# Patient Record
Sex: Male | Born: 1950 | Race: White | Hispanic: No | Marital: Married | Smoking: Former smoker
Health system: Southern US, Community
[De-identification: ages and names within clinical notes are randomized; demographics above are authoritative.]

## PROBLEM LIST (undated history)

## (undated) DIAGNOSIS — I341 Nonrheumatic mitral (valve) prolapse: Secondary | ICD-10-CM

## (undated) DIAGNOSIS — I4891 Unspecified atrial fibrillation: Secondary | ICD-10-CM

## (undated) DIAGNOSIS — I4510 Unspecified right bundle-branch block: Secondary | ICD-10-CM

## (undated) DIAGNOSIS — C4491 Basal cell carcinoma of skin, unspecified: Secondary | ICD-10-CM

## (undated) DIAGNOSIS — E785 Hyperlipidemia, unspecified: Secondary | ICD-10-CM

## (undated) HISTORY — DX: Unspecified right bundle-branch block: I45.10

## (undated) HISTORY — PX: BASAL CELL CARCINOMA EXCISION: SHX1214

## (undated) HISTORY — DX: Unspecified atrial fibrillation: I48.91

## (undated) HISTORY — DX: Hyperlipidemia, unspecified: E78.5

## (undated) HISTORY — PX: ANAL FISSURE REPAIR: SHX2312

## (undated) HISTORY — DX: Nonrheumatic mitral (valve) prolapse: I34.1

## (undated) HISTORY — PX: COLONOSCOPY: SHX174

## (undated) HISTORY — DX: Basal cell carcinoma of skin, unspecified: C44.91

---

## 1997-10-05 ENCOUNTER — Ambulatory Visit (HOSPITAL_COMMUNITY): Admission: RE | Admit: 1997-10-05 | Discharge: 1997-10-05 | Payer: Self-pay | Admitting: Gastroenterology

## 2000-01-26 ENCOUNTER — Emergency Department (HOSPITAL_COMMUNITY): Admission: EM | Admit: 2000-01-26 | Discharge: 2000-01-26 | Payer: Self-pay | Admitting: Emergency Medicine

## 2000-01-30 ENCOUNTER — Encounter (HOSPITAL_BASED_OUTPATIENT_CLINIC_OR_DEPARTMENT_OTHER): Payer: Self-pay | Admitting: General Surgery

## 2000-01-30 ENCOUNTER — Encounter: Admission: RE | Admit: 2000-01-30 | Discharge: 2000-01-30 | Payer: Self-pay | Admitting: General Surgery

## 2000-01-31 ENCOUNTER — Encounter (INDEPENDENT_AMBULATORY_CARE_PROVIDER_SITE_OTHER): Payer: Self-pay | Admitting: *Deleted

## 2000-01-31 ENCOUNTER — Ambulatory Visit (HOSPITAL_BASED_OUTPATIENT_CLINIC_OR_DEPARTMENT_OTHER): Admission: RE | Admit: 2000-01-31 | Discharge: 2000-01-31 | Payer: Self-pay | Admitting: General Surgery

## 2003-03-05 HISTORY — PX: OTHER SURGICAL HISTORY: SHX169

## 2004-06-07 ENCOUNTER — Ambulatory Visit: Payer: Self-pay | Admitting: Internal Medicine

## 2004-07-31 ENCOUNTER — Ambulatory Visit: Payer: Self-pay | Admitting: Internal Medicine

## 2005-04-25 ENCOUNTER — Ambulatory Visit: Payer: Self-pay | Admitting: Internal Medicine

## 2006-05-14 ENCOUNTER — Ambulatory Visit: Payer: Self-pay | Admitting: Internal Medicine

## 2006-05-14 LAB — CONVERTED CEMR LAB
ALT: 23 units/L (ref 0–40)
AST: 27 units/L (ref 0–37)
Albumin: 4.1 g/dL (ref 3.5–5.2)
Alkaline Phosphatase: 54 units/L (ref 39–117)
BUN: 8 mg/dL (ref 6–23)
Basophils Absolute: 0 10*3/uL (ref 0.0–0.1)
Basophils Relative: 0.1 % (ref 0.0–1.0)
Bilirubin, Direct: 0.2 mg/dL (ref 0.0–0.3)
CO2: 33 meq/L — ABNORMAL HIGH (ref 19–32)
Calcium: 9.2 mg/dL (ref 8.4–10.5)
Chloride: 104 meq/L (ref 96–112)
Cholesterol: 174 mg/dL (ref 0–200)
Creatinine, Ser: 0.9 mg/dL (ref 0.4–1.5)
Eosinophils Absolute: 0.1 10*3/uL (ref 0.0–0.6)
Eosinophils Relative: 1.3 % (ref 0.0–5.0)
GFR calc Af Amer: 113 mL/min
GFR calc non Af Amer: 93 mL/min
Glucose, Bld: 95 mg/dL (ref 70–99)
HCT: 43.9 % (ref 39.0–52.0)
HDL: 60.5 mg/dL (ref >39.0)
Hemoglobin: 14.7 g/dL (ref 13.0–17.0)
LDL Cholesterol: 102 mg/dL — ABNORMAL HIGH (ref 0–99)
Lymphocytes Relative: 21.3 % (ref 12.0–46.0)
MCHC: 33.5 g/dL (ref 30.0–36.0)
MCV: 95.2 fL (ref 78.0–100.0)
Monocytes Absolute: 0.4 10*3/uL (ref 0.2–0.7)
Monocytes Relative: 8.4 % (ref 3.0–11.0)
Neutro Abs: 3.5 10*3/uL (ref 1.4–7.7)
Neutrophils Relative %: 68.9 % (ref 43.0–77.0)
PSA: 1.49 ng/mL (ref 0.10–4.00)
Platelets: 246 10*3/uL (ref 150–400)
Potassium: 4.4 meq/L (ref 3.5–5.1)
RBC: 4.61 M/uL (ref 4.22–5.81)
RDW: 12.1 % (ref 11.5–14.6)
Sodium: 143 meq/L (ref 135–145)
TSH: 0.81 microintl units/mL (ref 0.35–5.50)
Total Bilirubin: 1 mg/dL (ref 0.3–1.2)
Total CHOL/HDL Ratio: 2.9
Total Protein: 6.7 g/dL (ref 6.0–8.3)
Triglycerides: 58 mg/dL (ref 0–149)
VLDL: 12 mg/dL (ref 0–40)
WBC: 5.1 10*3/uL (ref 4.5–10.5)

## 2007-05-14 ENCOUNTER — Encounter: Payer: Self-pay | Admitting: Internal Medicine

## 2007-05-28 ENCOUNTER — Ambulatory Visit: Payer: Self-pay | Admitting: Internal Medicine

## 2007-05-28 DIAGNOSIS — Z8679 Personal history of other diseases of the circulatory system: Secondary | ICD-10-CM | POA: Insufficient documentation

## 2007-05-29 LAB — CONVERTED CEMR LAB
ALT: 17 units/L (ref 0–53)
AST: 25 units/L (ref 0–37)
Albumin: 4.3 g/dL (ref 3.5–5.2)
Alkaline Phosphatase: 61 units/L (ref 39–117)
BUN: 8 mg/dL (ref 6–23)
Basophils Absolute: 0 10*3/uL (ref 0.0–0.1)
Basophils Relative: 0.6 % (ref 0.0–1.0)
Bilirubin, Direct: 0.1 mg/dL (ref 0.0–0.3)
CO2: 33 meq/L — ABNORMAL HIGH (ref 19–32)
Calcium: 9.3 mg/dL (ref 8.4–10.5)
Chloride: 102 meq/L (ref 96–112)
Cholesterol: 178 mg/dL (ref 0–200)
Creatinine, Ser: 1 mg/dL (ref 0.4–1.5)
Eosinophils Absolute: 0.1 10*3/uL (ref 0.0–0.6)
Eosinophils Relative: 1.4 % (ref 0.0–5.0)
GFR calc Af Amer: 100 mL/min
GFR calc non Af Amer: 82 mL/min
Glucose, Bld: 102 mg/dL — ABNORMAL HIGH (ref 70–99)
HCT: 42.9 % (ref 39.0–52.0)
HDL: 60.5 mg/dL (ref >39.0)
Hemoglobin: 14.3 g/dL (ref 13.0–17.0)
LDL Cholesterol: 105 mg/dL — ABNORMAL HIGH (ref 0–99)
Lymphocytes Relative: 22.1 % (ref 12.0–46.0)
MCHC: 33.4 g/dL (ref 30.0–36.0)
MCV: 94.6 fL (ref 78.0–100.0)
Monocytes Absolute: 0.5 10*3/uL (ref 0.2–0.7)
Monocytes Relative: 8.5 % (ref 3.0–11.0)
Neutro Abs: 3.5 10*3/uL (ref 1.4–7.7)
Neutrophils Relative %: 67.4 % (ref 43.0–77.0)
PSA: 0.72 ng/mL (ref 0.10–4.00)
Platelets: 243 10*3/uL (ref 150–400)
Potassium: 4.8 meq/L (ref 3.5–5.1)
RBC: 4.53 M/uL (ref 4.22–5.81)
RDW: 11.7 % (ref 11.5–14.6)
Sodium: 139 meq/L (ref 135–145)
Total Bilirubin: 1.2 mg/dL (ref 0.3–1.2)
Total CHOL/HDL Ratio: 2.9
Total Protein: 6.9 g/dL (ref 6.0–8.3)
Triglycerides: 61 mg/dL (ref 0–149)
VLDL: 12 mg/dL (ref 0–40)
WBC: 5.3 10*3/uL (ref 4.5–10.5)

## 2007-06-11 ENCOUNTER — Ambulatory Visit: Payer: Self-pay | Admitting: Internal Medicine

## 2007-06-11 ENCOUNTER — Encounter (INDEPENDENT_AMBULATORY_CARE_PROVIDER_SITE_OTHER): Payer: Self-pay | Admitting: *Deleted

## 2007-06-11 LAB — CONVERTED CEMR LAB
OCCULT 1: NEGATIVE
OCCULT 2: NEGATIVE
OCCULT 3: NEGATIVE

## 2007-09-18 ENCOUNTER — Ambulatory Visit: Payer: Self-pay | Admitting: Internal Medicine

## 2007-09-18 DIAGNOSIS — R002 Palpitations: Secondary | ICD-10-CM | POA: Insufficient documentation

## 2007-09-22 ENCOUNTER — Encounter (INDEPENDENT_AMBULATORY_CARE_PROVIDER_SITE_OTHER): Payer: Self-pay | Admitting: *Deleted

## 2008-09-21 ENCOUNTER — Ambulatory Visit: Payer: Self-pay | Admitting: Internal Medicine

## 2008-09-21 DIAGNOSIS — E785 Hyperlipidemia, unspecified: Secondary | ICD-10-CM

## 2008-09-21 DIAGNOSIS — E782 Mixed hyperlipidemia: Secondary | ICD-10-CM | POA: Insufficient documentation

## 2008-09-28 ENCOUNTER — Encounter (INDEPENDENT_AMBULATORY_CARE_PROVIDER_SITE_OTHER): Payer: Self-pay | Admitting: *Deleted

## 2008-11-01 ENCOUNTER — Ambulatory Visit: Payer: Self-pay | Admitting: Gastroenterology

## 2008-11-22 ENCOUNTER — Ambulatory Visit: Payer: Self-pay | Admitting: Gastroenterology

## 2008-11-22 ENCOUNTER — Encounter: Payer: Self-pay | Admitting: Gastroenterology

## 2008-11-22 LAB — HM COLONOSCOPY: HM Colonoscopy: NORMAL

## 2008-11-24 ENCOUNTER — Encounter: Payer: Self-pay | Admitting: Gastroenterology

## 2008-12-10 ENCOUNTER — Emergency Department (HOSPITAL_COMMUNITY): Admission: EM | Admit: 2008-12-10 | Discharge: 2008-12-10 | Payer: Self-pay | Admitting: Emergency Medicine

## 2009-07-20 ENCOUNTER — Telehealth (INDEPENDENT_AMBULATORY_CARE_PROVIDER_SITE_OTHER): Payer: Self-pay | Admitting: *Deleted

## 2009-08-07 ENCOUNTER — Ambulatory Visit: Payer: Self-pay | Admitting: Internal Medicine

## 2009-08-07 DIAGNOSIS — Z85828 Personal history of other malignant neoplasm of skin: Secondary | ICD-10-CM | POA: Insufficient documentation

## 2010-01-29 ENCOUNTER — Telehealth (INDEPENDENT_AMBULATORY_CARE_PROVIDER_SITE_OTHER): Payer: Self-pay | Admitting: *Deleted

## 2010-04-01 LAB — CONVERTED CEMR LAB
ALT: 16 units/L (ref 0–53)
BUN: 8 mg/dL (ref 6–23)
Basophils Relative: 0 % (ref 0.0–3.0)
CO2: 32 meq/L (ref 19–32)
Calcium: 9.6 mg/dL (ref 8.4–10.5)
Chloride: 108 meq/L (ref 96–112)
Cholesterol, target level: 200 mg/dL
Cholesterol: 186 mg/dL (ref 0–200)
Eosinophils Absolute: 0.1 10*3/uL (ref 0.0–0.7)
Eosinophils Relative: 1.3 % (ref 0.0–5.0)
HCT: 44.1 % (ref 39.0–52.0)
HDL goal, serum: 40 mg/dL
LDL Cholesterol: 105 mg/dL — ABNORMAL HIGH (ref 0–99)
LDL Goal: 160 mg/dL
Lymphs Abs: 1.1 10*3/uL (ref 0.7–4.0)
MCHC: 33.6 g/dL (ref 30.0–36.0)
MCV: 95.6 fL (ref 78.0–100.0)
Magnesium: 2.2 mg/dL (ref 1.5–2.5)
Monocytes Absolute: 0.5 10*3/uL (ref 0.1–1.0)
PSA: 0.52 ng/mL (ref 0.10–4.00)
Platelets: 229 10*3/uL (ref 150.0–400.0)
Potassium: 4.3 meq/L (ref 3.5–5.1)
Potassium: 4.8 meq/L (ref 3.5–5.3)
RBC: 4.61 M/uL (ref 4.22–5.81)
T4, Total: 8 ug/dL (ref 5.0–12.5)
TSH: 0.918 microintl units/mL (ref 0.350–4.50)
TSH: 0.93 microintl units/mL (ref 0.35–5.50)
Total Protein: 7 g/dL (ref 6.0–8.3)
Triglycerides: 75 mg/dL (ref 0.0–149.0)
WBC: 5.1 10*3/uL (ref 4.5–10.5)

## 2010-04-03 NOTE — Progress Notes (Signed)
Summary: Refill Request  Phone Note Call from Patient Call back at Work Phone [phone]   Caller: Patient Summary of Call: Message left on VM: Patient would like a refill on Propecia./Donald Wilkins  Jul 20, 2009 10:31 AM   Follow-up for Phone Call        I reviewed patient's med list and Propecia/Finasteride was removed (no indication that patient had a reaction). I will discuss with Dr.Hopper about re-adding med to list.   Follow-up by: Shonna Chock,  Jul 20, 2009 10:32 AM  Additional Follow-up for Phone Call Additional follow up Details #1::        Per Dr.Hopper ok to re-add Additional Follow-up by: Shonna Chock,  Jul 20, 2009 2:46 PM    New/Updated Medications: FINASTERIDE 5 MG TABS (FINASTERIDE) 1 by mouth once daily Prescriptions: FINASTERIDE 5 MG TABS (FINASTERIDE) 1 by mouth once daily  #30 x 2   Entered by:   Shonna Chock   Authorized by:   Marga Melnick MD   Signed by:   Shonna Chock on 07/20/2009   Method used:   Electronically to        [facility] [id number] (retail)       [address]       [place], [place]  [address]       Ph: [phone]       Fax: [phone]   RxID:   [id number]

## 2010-04-03 NOTE — Progress Notes (Signed)
Summary: Refill Request  Phone Note Refill Request Call back at [phone] Message from:  Pharmacy on January 29, 2010 1:07 PM  Refills Requested: Medication #1:  FLUOXETINE HCL 10 MG  CAPS 1 by mouth once daily **APPOINTMENT DUE**   Dosage confirmed as above?Dosage Confirmed   Supply Requested: 3 months   Last Refilled: 10/13/2009 [facility] [address]  Next Appointment Scheduled: none Initial call taken by: Harold Barban,  January 29, 2010 1:08 PM    Prescriptions: FLUOXETINE HCL 10 MG  CAPS (FLUOXETINE HCL) 1 by mouth once daily **APPOINTMENT DUE**  #90 x 1   Entered by:   Shonna Chock CMA   Authorized by:   Marga Melnick MD   Signed by:   Shonna Chock CMA on 01/29/2010   Method used:   Electronically to        [facility] [phone]* (retail)       [address]       [place], [place]  [address]       Ph: [phone]       Fax: [phone]   RxID:   [id number]

## 2010-04-03 NOTE — Assessment & Plan Note (Signed)
Summary: PAIN IN NECK/CBS   Vital Signs:  Patient profile:   60 year old male Weight:      168 pounds BMI:     24.72 Temp:     98.5 degrees F oral Pulse rate:   60 / minute Resp:     16 per minute BP sitting:   108 / 70  (left arm) Cuff size:   large  Vitals Entered By: Shonna Chock (August 07, 2009 4:14 PM) CC: Left side neck pain x coupe weeks, no known injury Comments REVIEWED MED LIST, PATIENT AGREED DOSE AND INSTRUCTION CORRECT    CC:  Left side neck pain x coupe weeks and no known injury.  History of Present Illness: Mild sore throat  on L for 2 weeks , worse in am but improved during day. Rx: none. 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 ENT:  Denies ear discharge, earache, nasal congestion, postnasal drainage, and sinus pressure; No frontal headache, facial pain, purulence, dental pain. Resp:  Denies cough and sputum productive. Allergy:  Denies itching eyes and sneezing.  Physical Exam  General:  in no acute distress; alert,appropriate and cooperative throughout examination Ears:  External ear exam shows no significant lesions or deformities.  Otoscopic examination reveals clear canals, tympanic membranes are intact bilaterally without bulging, retraction, inflammation or discharge. Hearing is grossly normal bilaterally. Nose:  External nasal examination shows no deformity or inflammation. Nasal mucosa are pink and moist without lesions or  exudates. Mouth:  Oral mucosa and oropharynx without lesions or exudates.  Teeth in good repair; lower  partial. Aphthous ulcer. Neck:  No deformities, masses, or tenderness noted. Thyroid normal. Full ROM Skin:  Intact without suspicious lesions or rashes Cervical Nodes:  No lymphadenopathy noted Axillary Nodes:  No palpable lymphadenopathy   Impression & Recommendations:  Problem # 1:  PHARYNGITIS-ACUTE (ICD-462)  His updated medication list for this problem includes:    Bayer Childrens Aspirin 81 Mg Chew (Aspirin)    Doxycycline Hyclate 100 Mg Caps (Doxycycline hyclate) .[name]... Dissolve tablet in 1 tsp water ; swish well & swallow two times a day . avoid direct sun on this  Problem # 2:  APHTHOUS ULCERS (ICD-528.2)  Complete Medication List: 1)  Bayer Childrens Aspirin 81 Mg Chew (Aspirin) 2)  Omega 3  3)  Coq10  4)  Fluoxetine Hcl 10 Mg Caps (Fluoxetine hcl) .[name].. 1 qd 5)  Finasteride 5 Mg Tabs (Finasteride) .[name].. 1 by mouth once daily 6)  Doxycycline Hyclate 100 Mg Caps (Doxycycline hyclate) .... Dissolve tablet in 1 tsp water ; swish well & swallow two times a day . avoid direct sun on this  Patient Instructions: 1)  ENT referral if symptoms persist Prescriptions: DOXYCYCLINE HYCLATE 100 MG CAPS (DOXYCYCLINE HYCLATE) dissolve tablet in 1 tsp water ; swish well & swallow two times a day . Avoid direct sun on this  #20 x 0   Entered and Authorized by:   Marga Melnick MD   Signed  by:   Marga Melnick MD on 08/07/2009   Method used:   Faxed to ...       [facility] [address] [phone]* (retail)       [address]       [place], [place]  [address]       Ph: [phone]       Fax: [phone]   RxID:   [id number]

## 2010-04-17 ENCOUNTER — Encounter: Payer: Self-pay | Admitting: Internal Medicine

## 2010-04-17 ENCOUNTER — Other Ambulatory Visit: Payer: Self-pay | Admitting: Internal Medicine

## 2010-04-17 ENCOUNTER — Encounter (INDEPENDENT_AMBULATORY_CARE_PROVIDER_SITE_OTHER): Payer: 59 | Admitting: Internal Medicine

## 2010-04-17 DIAGNOSIS — Z125 Encounter for screening for malignant neoplasm of prostate: Secondary | ICD-10-CM

## 2010-04-17 DIAGNOSIS — Z8679 Personal history of other diseases of the circulatory system: Secondary | ICD-10-CM

## 2010-04-17 DIAGNOSIS — Z0000 Encounter for general adult medical examination without abnormal findings: Secondary | ICD-10-CM

## 2010-04-17 DIAGNOSIS — E785 Hyperlipidemia, unspecified: Secondary | ICD-10-CM

## 2010-04-17 LAB — CBC WITH DIFFERENTIAL/PLATELET
Basophils Relative: 0.3 % (ref 0.0–3.0)
Eosinophils Absolute: 0.1 10*3/uL (ref 0.0–0.7)
HCT: 41.5 % (ref 39.0–52.0)
Hemoglobin: 14.4 g/dL (ref 13.0–17.0)
Lymphocytes Relative: 26.4 % (ref 12.0–46.0)
MCHC: 34.8 g/dL (ref 30.0–36.0)
MCV: 94.7 fl (ref 78.0–100.0)
Monocytes Absolute: 0.4 10*3/uL (ref 0.1–1.0)
Neutro Abs: 2.9 10*3/uL (ref 1.4–7.7)
RBC: 4.38 Mil/uL (ref 4.22–5.81)

## 2010-04-17 LAB — HEPATIC FUNCTION PANEL
Albumin: 4.2 g/dL (ref 3.5–5.2)
Alkaline Phosphatase: 50 U/L (ref 39–117)
Total Protein: 6.6 g/dL (ref 6.0–8.3)

## 2010-04-17 LAB — BASIC METABOLIC PANEL
CO2: 30 mEq/L (ref 19–32)
Chloride: 104 mEq/L (ref 96–112)
Potassium: 4.1 mEq/L (ref 3.5–5.1)
Sodium: 140 mEq/L (ref 135–145)

## 2010-04-17 LAB — LIPID PANEL: Total CHOL/HDL Ratio: 3

## 2010-04-17 LAB — TSH: TSH: 0.88 u[IU]/mL (ref 0.35–5.50)

## 2010-04-25 NOTE — Assessment & Plan Note (Signed)
Summary: cpx/fasting//kn   Vital Signs:  Patient profile:   60 year old male Height:      70 inches Weight:      167.4 pounds BMI:     24.11 Temp:     98.7 degrees F oral Pulse rate:   56 / minute Resp:     14 per minute BP sitting:   112 / 70  (left arm) Cuff size:   large  Vitals Entered By: Shonna Chock CMA (April 17, 2010 8:41 AM)    History of Present Illness:    [name] is here for a physical; he is asymptomatic.  Lipid Management History:      Positive NCEP/ATP III risk factors include male age 49 years old or older and family history for ischemic heart disease (females less than 59 years old & males less than 34 years old).  Negative NCEP/ATP III risk factors include non-diabetic, HDL cholesterol greater than 60, non-tobacco-user status, non-hypertensive, no ASHD (atherosclerotic heart disease), no prior stroke/TIA, no peripheral vascular disease, and no history of aortic aneurysm.     Current Medications (verified): 1)  Bayer Childrens Aspirin 81 Mg  Chew (Aspirin) 2)  Omega 3 3)  Coq10 4)  Fluoxetine Hcl 10 Mg  Caps (Fluoxetine Hcl) .[name].. 1 By Mouth Once Daily **appointment Due** 5)  Finasteride 5 Mg Tabs (Finasteride) .[name].. 1 By Mouth Once Daily  Allergies: No Known Drug Allergies  Past History:  Past Medical History: MVP IRBBB Hyperlipidemia: Framingham Study LDL = < 160. FH of premature CAD. NMR Lipoprofile 2007: LDL 122 ( 954/ 233), HDL 68, TG 50. LDL goal = < 160, ideally < 135. 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 Anal fistula  repair 2000 Colonoscopy 1999 for  rectal pain: internal hemorrhoids; Mohs L face 2011 for Basal Cell Cancer  Family History: No FH HTN , CVA,colon cancer, prostate cancer Father: DM,lung  cancer Mother: MI @ 63; M uncle: MI pre 30 Siblings: bro: benign cns tumor  Social History: Married 3 kids Occupation:Director of  Finance Former Smoker: quit  @ age 89 Alcohol use-yes: socially Regular exercise-yes: 4-5X/week treadmill, machines  Review of Systems  The patient denies anorexia, fever, weight loss, weight gain, vision loss, decreased hearing, hoarseness, chest pain, syncope, dyspnea on exertion, peripheral edema, prolonged cough, headaches, hemoptysis, abdominal pain, melena, hematochezia, severe indigestion/heartburn, hematuria, suspicious skin lesions, depression, unusual weight change, abnormal bleeding, enlarged lymph nodes, and angioedema.         Low grade tinnitus , ? from playing drums.  Physical Exam  General:  Thin but well-nourished;alert,appropriate and cooperative throughout examination Head:  Normocephalic and atraumatic without obvious abnormalities.  Eyes:  No corneal or conjunctival inflammation noted.  Perrla. Funduscopic exam benign, without hemorrhages, exudates or papilledema.  Ears:  External ear exam shows no significant lesions or deformities.  Otoscopic examination reveals clear canals, tympanic membranes are intact bilaterally without bulging, retraction, inflammation or discharge. Hearing is grossly normal bilaterally. Nose:  External nasal examination shows no deformity or inflammation. Nasal mucosa are pink and moist without lesions or exudates. Mouth:  Oral mucosa and oropharynx without lesions or exudates.  Teeth in good repair. Lower partial ; upper bridge Neck:  No deformities, masses, or tenderness noted. Lungs:  Normal respiratory effort, chest expands symmetrically. Lungs are clear to auscultation, no crackles or wheezes. Heart:  regular rhythm, no murmur,  no gallop, no rub, no JVD, no HJR, and bradycardia.   Classic click @ apex Abdomen:  Bowel sounds positive,abdomen soft and non-tender without masses, organomegaly or hernias noted. Rectal:  No external abnormalities noted. Normal sphincter tone. No rectal masses or tenderness. Genitalia:  Testes bilaterally descended  without nodularity, tenderness or masses. No scrotal masses or lesions. No penis lesions or urethral discharge. Small L varicocele.   Prostate:  Prostate gland firm and smooth, no enlargement, nodularity, tenderness, mass, asymmetry or induration. Msk:  No deformity or scoliosis noted of thoracic or lumbar spine.   Pulses:  R and L carotid,radial,dorsalis pedis and posterior tibial pulses are full and equal bilaterally Extremities:  No clubbing, cyanosis, edema, or deformity noted with normal full range of motion of all joints.   Neurologic:  alert & oriented X3 and DTRs symmetrical and normal.   Skin:  Intact without suspicious lesions or rashes Cervical Nodes:  No lymphadenopathy noted Axillary Nodes:  No palpable lymphadenopathy Inguinal Nodes:  No significant adenopathy Psych:  memory intact for recent and remote, normally interactive, and good eye contact.     Impression & Recommendations:  Problem # 1:  ROUTINE GENERAL MEDICAL EXAM@HEALTH  CARE FACL (ICD-V70.0)  Orders: EKG w/ Interpretation (93000) Venipuncture (16109) TLB-Lipid Panel (80061-LIPID) TLB-BMP (Basic Metabolic Panel-BMET) (80048-METABOL) TLB-CBC Platelet - w/Differential (85025-CBCD) TLB-Hepatic/Liver Function Pnl (80076-HEPATIC) TLB-TSH (Thyroid Stimulating Hormone) (84443-TSH) TLB-PSA (Prostate Specific Antigen) (84153-PSA)  Problem # 2:  HYPERLIPIDEMIA (ICD-272.4)  Problem # 3:  MITRAL VALVE PROLAPSE, HX OF (ICD-V12.50) Classic click w/o MR; NO SBE prophylaxis needed   Problem # 4:  SPECIAL SCREENING MALIGNANT NEOPLASM OF PROSTATE (ICD-V76.44)  Complete Medication List: 1)  Bayer Childrens Aspirin 81 Mg Chew (Aspirin) 2)  Omega 3  3)  Coq10  4)  Fluoxetine Hcl 10 Mg Caps (Fluoxetine hcl) .[name].. 1 by mouth once daily 5)  Finasteride 5 Mg Tabs (Finasteride) .... Daily  as directed  Lipid Assessment/Plan:      Based on NCEP/ATP III, the patient's risk factor category is "0-1 risk factors".  The patient's  lipid goals are as follows: Total cholesterol goal is 200; LDL cholesterol goal is 160; HDL cholesterol goal is 40; Triglyceride goal is 150.  His LDL cholesterol goal has been met.    Patient Instructions: 1)  No antibiotics needed pre dental work Prescriptions: FINASTERIDE 5 MG TABS (FINASTERIDE) daily  as directed  #30 x 5   Entered and Authorized by:   Marga Melnick MD   Signed by:   Marga Melnick MD on 04/17/2010   Method used:   Print then Give to Patient   RxID:   [id number] FLUOXETINE HCL 10 MG  CAPS (FLUOXETINE HCL) 1 by mouth once daily  #90 x 3   Entered and Authorized by:   Marga Melnick MD   Signed by:   Marga Melnick MD on 04/17/2010   Method used:   Print then Give to Patient   RxID:   [id number]    Orders Added: 1)  Est. Patient 40-64 years [99396] 2)  EKG w/ Interpretation [93000] 3)  Venipuncture [36415] 4)  TLB-Lipid Panel [80061-LIPID] 5)  TLB-BMP (Basic Metabolic Panel-BMET) [80048-METABOL] 6)  TLB-CBC Platelet - w/Differential [85025-CBCD] 7)  TLB-Hepatic/Liver Function Pnl [80076-HEPATIC] 8)  TLB-TSH (Thyroid Stimulating Hormone) [84443-TSH] 9)  TLB-PSA (Prostate Specific Antigen) [29528-UXL]     Appended Document: cpx/fasting//kn

## 2010-06-19 ENCOUNTER — Other Ambulatory Visit: Payer: Self-pay | Admitting: Dermatology

## 2010-07-02 ENCOUNTER — Telehealth: Payer: Self-pay | Admitting: Internal Medicine

## 2010-07-02 NOTE — Telephone Encounter (Signed)
Patient would like appointment with Dr Alwyn Ren to look at "sore area" on left side of neck---says it is not like a sore throat--it is area under his chin on left side---no fever, no cold symptoms,---had something like this a few months ago, but Dr Alwyn Ren couldn't find anything---since area is there, hopes that he can be seen as soon as possible so Dr Alwyn Ren can see it when it is sore and noticeable

## 2010-07-02 NOTE — Telephone Encounter (Signed)
Spoke w/ pt appt scheduled to see Hop .

## 2010-07-05 ENCOUNTER — Encounter: Payer: Self-pay | Admitting: *Deleted

## 2010-07-06 ENCOUNTER — Encounter: Payer: Self-pay | Admitting: Internal Medicine

## 2010-07-06 ENCOUNTER — Ambulatory Visit (INDEPENDENT_AMBULATORY_CARE_PROVIDER_SITE_OTHER): Payer: 59 | Admitting: Internal Medicine

## 2010-07-06 DIAGNOSIS — M542 Cervicalgia: Secondary | ICD-10-CM

## 2010-07-06 DIAGNOSIS — I889 Nonspecific lymphadenitis, unspecified: Secondary | ICD-10-CM

## 2010-07-06 DIAGNOSIS — M278 Other specified diseases of jaws: Secondary | ICD-10-CM | POA: Insufficient documentation

## 2010-07-06 NOTE — Patient Instructions (Signed)
Call for ENT referral if symptoms recur; report Warning Signs (pain , swelling ,redness, fever)

## 2010-07-06 NOTE — Progress Notes (Signed)
  Subjective:    Patient ID: Donald Wilkins, male    DOB: 1950-11-11, 60 y.o.   MRN: [id number]  HPI NECK PAIN  Location: L anterior neck below L mandible  Onset: 2 weeks ago  Severity: varies 4-8, worse in am but improving markedly over past 48 hrs Pain is described as: aching pain with swallowing & turning head to R  treatment: not with NSAIDS  Pain radiation: no  Impaired range of motion: no   History of trauma:  no   Past history of similar problem:  yes, within past year   Symptoms Numbness/tingling:  no  Weakness:  no  Red Flags Fever:  no  Headache:  no  Bowel/bladder dysfunction:  no      Review of Systems The major and minor symptoms of rhinosinusitis were reviewed: nasal congestion/obstruction; nasal purulence; facial pain; anosmia; fatigue; halitosis; earache and dental pain. All absent    Objective:   Physical Exam General appearance is of good health and nourishment; no acute distress or increased work of breathing is present.  No  lymphadenopathy about the head, neck, or axilla noted.   Eyes: No conjunctival inflammation or lid edema is present. There is no scleral icterus.  Ears:  External ear exam shows no significant lesions or deformities.  Otoscopic examination reveals clear canals, tympanic membranes are intact bilaterally without bulging, retraction, inflammation or discharge.  Nose:  External nasal examination shows no deformity or inflammation. Nasal mucosa are pink and moist without lesions or exudates. No septal dislocation or dislocation.No obstruction to airflow.   Oral exam: Dental hygiene is good; lips and gums are healthy appearing.There is no oropharyngeal erythema or exudate noted.  Lower partial  Neck:  No deformities, thyromegaly, masses, or tenderness noted.   Supple with full range of motion without pain.   Heart:  Normal rate and regular rhythm. S1 and S2 normal without gallop, murmur, click, rub or other extra sounds.   Lungs:Chest clear to  auscultation; no wheezes, rhonchi,rales ,or rubs present.No increased work of breathing.    Extremities:  No cyanosis, edema, or clubbing  noted    Skin: Warm & dry w/o jaundice or tenting.   PMH of fibrous Dysplasia of mandible       Assessment & Plan:  #1 neck pain w/o palpable abnormality ;improving. PMH of mandibular fibrous dysplasia                             Plan: tramadol as needed (declined); ENT referral for recurrence

## 2010-07-18 ENCOUNTER — Other Ambulatory Visit: Payer: Self-pay | Admitting: Dermatology

## 2010-07-20 NOTE — Op Note (Signed)
[place]  Patient:    Donald Wilkins, Donald Wilkins                          MRN: [id number] Proc. Date: 01/31/00 Adm. Date:  [id number] Attending:  Fortino Sic                           Operative Report  PREOPERATIVE DIAGNOSIS:  Fistula-in-ano.  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  PROCEDURE:  Proctoscopy and anal fistulectomy.  SURGEON:  Marnee Spring. Wiliam Ke, M.D.  ANESTHESIA:  General by hospital.  DESCRIPTION OF PROCEDURE:  Under good general anesthesia, the patient in dorsal lithotomy position, skin of the perineum was prepped and draped in the usual manner.  Proctoscopy was performed to 15 cm and was negative.  The patient had a secondary opening in the posterior midline.  The patient also had a clear-cut swelling and redness in the left posterolateral quadrant of the anal verge.  Much pus was expressed through the secondary opening.  A probe was easily passed in the secondary opening through a posterior midline crypt.  The probe was used as a guide to cut down and open up this part of the fistula.  The patient had an obvious hemi-horseshoe fistula stretching around almost the entire hemicircumference of the anal verge, and this was also opened and unroofed.  Most of the wound was saucerized.  Hemostasis was obtained with electrocautery current and with several figure-of-eight 3-0 chromic sutures.  A 3-0 chromic running suture was used on the internal part of the fistula between mucosa and the submucosal tissue for hemostasis.  The wound was quite large and very friable.  The wound was copiously irrigated with saline and then it was packed with Iodoform gauze, which will be removed tonight or tomorrow.  Nupercainal ointment was placed externally, and the wound was dressed.  Estimated blood loss for the procedure was 50 cc.  The patient received no blood.  He left the operating room in  stable condition after sponge and needle counts were verified. DD:  01/31/00 TD:  01/31/00 Job: [id number]

## 2010-08-11 ENCOUNTER — Other Ambulatory Visit: Payer: Self-pay | Admitting: Internal Medicine

## 2010-12-09 ENCOUNTER — Other Ambulatory Visit: Payer: Self-pay | Admitting: Internal Medicine

## 2011-06-14 ENCOUNTER — Encounter: Payer: Self-pay | Admitting: Gastroenterology

## 2011-06-22 ENCOUNTER — Other Ambulatory Visit: Payer: Self-pay | Admitting: Internal Medicine

## 2011-08-08 ENCOUNTER — Encounter: Payer: Self-pay | Admitting: Internal Medicine

## 2011-08-08 ENCOUNTER — Ambulatory Visit (INDEPENDENT_AMBULATORY_CARE_PROVIDER_SITE_OTHER): Payer: 59 | Admitting: Internal Medicine

## 2011-08-08 VITALS — BP 120/78 | HR 65 | Temp 98.1°F | Resp 12 | Wt 175.8 lb

## 2011-08-08 DIAGNOSIS — Z0000 Encounter for general adult medical examination without abnormal findings: Secondary | ICD-10-CM

## 2011-08-08 DIAGNOSIS — E785 Hyperlipidemia, unspecified: Secondary | ICD-10-CM

## 2011-08-08 LAB — CBC WITH DIFFERENTIAL/PLATELET
Eosinophils Relative: 0.8 % (ref 0.0–5.0)
HCT: 42.7 % (ref 39.0–52.0)
Hemoglobin: 14.2 g/dL (ref 13.0–17.0)
Lymphocytes Relative: 19.6 % (ref 12.0–46.0)
Lymphs Abs: 1.2 10*3/uL (ref 0.7–4.0)
Monocytes Relative: 7.8 % (ref 3.0–12.0)
Neutro Abs: 4.3 10*3/uL (ref 1.4–7.7)
WBC: 6.1 10*3/uL (ref 4.5–10.5)

## 2011-08-08 LAB — BASIC METABOLIC PANEL
CO2: 29 mEq/L (ref 19–32)
Calcium: 9.5 mg/dL (ref 8.4–10.5)
Chloride: 106 mEq/L (ref 96–112)
Creatinine, Ser: 0.8 mg/dL (ref 0.4–1.5)
Glucose, Bld: 72 mg/dL (ref 70–99)
Sodium: 144 mEq/L (ref 135–145)

## 2011-08-08 LAB — LIPID PANEL
HDL: 72.1 mg/dL (ref >39.00)
Total CHOL/HDL Ratio: 3

## 2011-08-08 LAB — HEPATIC FUNCTION PANEL
ALT: 21 U/L (ref 0–53)
Alkaline Phosphatase: 51 U/L (ref 39–117)
Bilirubin, Direct: 0.1 mg/dL (ref 0.0–0.3)
Total Protein: 6.7 g/dL (ref 6.0–8.3)

## 2011-08-08 LAB — TSH: TSH: 0.8 u[IU]/mL (ref 0.35–5.50)

## 2011-08-08 MED ORDER — FINASTERIDE 5 MG PO TABS: ORAL_TABLET | ORAL | Status: DC

## 2011-08-08 MED ORDER — FLUOXETINE HCL 10 MG PO CAPS: 10.0000 mg | ORAL_CAPSULE | Freq: Every day | ORAL | Status: DC

## 2011-08-08 NOTE — Progress Notes (Signed)
  Subjective:    Patient ID: Donald Wilkins, male    DOB: Jul 16, 1950, 61 y.o.   MRN: [id number]  HPI  Mr Stetzer is here for a physical; he denies acute issues.      Review of Systems He exercises at a high cardiovascular level on a regular basis without chest pain palpitations, or dyspnea. He did stop fluoxetine and did notice increase in palpitations and possibly dyspnea. The symptoms resolved present in the medication. He has a history of mitral valve prolapse.       Objective:   Physical Exam Gen.: Healthy and well-nourished in appearance. Alert, appropriate and cooperative throughout exam. Head: Normocephalic without obvious abnormalities  Eyes: No corneal or conjunctival inflammation noted. Pupils equal round reactive to light and accommodation. Fundal exam is benign without hemorrhages, exudate, papilledema. Extraocular motion intact. Vision grossly normal. Ears: External  ear exam reveals no significant lesions or deformities. Canals clear .TMs normal. Hearing is grossly normal bilaterally. Nose: External nasal exam reveals no deformity or inflammation. Nasal mucosa are pink and moist. No lesions or exudates noted.  Mouth: Oral mucosa and oropharynx reveal no lesions or exudates. Teeth in good repair.Lower partial Neck: No deformities, masses, or tenderness noted. Range of motion & Thyroid normal. Lungs: Normal respiratory effort; chest expands symmetrically. Lungs are clear to auscultation without rales, wheezes, or increased work of breathing. Heart: Normal rate and rhythm. Normal S1 and S2. No gallop,  or rub. Apical click w/o  murmur. Abdomen: Bowel sounds normal; abdomen soft and nontender. No masses, organomegaly or hernias noted. Genitalia/ DRE: Small granuloma in the left epididymal area. Prostate is normal without enlargement, asymmetry, induration, or nodularity.    Musculoskeletal/extremities: No deformity or scoliosis noted of  the thoracic or lumbar spine. No clubbing,  cyanosis, edema, or deformity noted. Range of motion  normal .Tone & strength  normal.Joints normal. Nail health  good. Vascular: Carotid, radial artery, dorsalis pedis and  posterior tibial pulses are full and equal. No bruits present. Neurologic: Alert and oriented x3. Deep tendon reflexes symmetrical and normal.          Skin: Intact without suspicious lesions or rashes. Lymph: No cervical, axillary, or inguinal lymphadenopathy present. Psych: Mood and affect are normal. Normally interactive                                                                                         Assessment & Plan:  #1 comprehensive physical exam; no acute findings #2 see Problem List with Assessments & Recommendations Plan: see Orders

## 2011-08-08 NOTE — Patient Instructions (Signed)
Preventive Health Care: Exercise at least 30-45 minutes a day,  3-4 days a week.  Eat a low-fat diet with lots of fruits and vegetables, up to 7-9 servings per day.  Consume less than 40 grams of sugar per day from foods & drinks with High Fructose Corn Sugar as # 1,2,3 or # 4 on label. To prevent palpitations or premature beats, avoid stimulants such as decongestants, diet pills, nicotine, or caffeine (coffee, tea, cola, or chocolate) to excess.  Please try to go on My Chart within the next 24 hours to allow me to release the results directly to you.  

## 2011-08-13 ENCOUNTER — Encounter: Payer: Self-pay | Admitting: *Deleted

## 2011-11-07 ENCOUNTER — Ambulatory Visit (INDEPENDENT_AMBULATORY_CARE_PROVIDER_SITE_OTHER): Payer: 59 | Admitting: Internal Medicine

## 2011-11-07 ENCOUNTER — Encounter: Payer: Self-pay | Admitting: Internal Medicine

## 2011-11-07 VITALS — BP 112/78 | HR 69 | Temp 98.4°F | Wt 177.6 lb

## 2011-11-07 DIAGNOSIS — S8990XA Unspecified injury of unspecified lower leg, initial encounter: Secondary | ICD-10-CM

## 2011-11-07 DIAGNOSIS — S99921A Unspecified injury of right foot, initial encounter: Secondary | ICD-10-CM

## 2011-11-07 NOTE — Progress Notes (Signed)
  Subjective:    Patient ID: Donald Wilkins, male    DOB: [birth year], 61 y.o.   MRN: [id number]  HPI One week ago while running the bases; he felt a "pop" & possibly heard  in the sole of his right foot. The pain persisted and extended to the heel. There was associated bruising subjectively. Advil has received resulted in improvement in his symptoms     Review of Systems Constitutional: no fever, chills, sweats  Musculoskeletal:no  muscle cramps or pain; no  joint stiffness, redness, or swelling Skin:no rash, color change Neuro: no weakness; incontinence (stool/urine); numbness and tingling Heme:no lymphadenopathy        Objective:   Physical Exam  He is healthy and well-nourished and in no acute distress  Pedal pulses are excellent. He does have venous spiders over both medial malleolar areas.  Deep tendon reflexes, strength, and tone in the lower extremities is normal  There is no significant discomfort with compression or rotation of the foot and ankle. He does have some discomfort with percussion over the plantar fascia of the right sole anterior to the calcaneus. There is a faint bruise in the medial arch of the right foot.        Assessment & Plan:  #1 posttraumatic injury to the right plantar fascia; clinically no evidence of fracture.  Plan: Sports medicine referral for probable ultrasound therapy if no better with the recommended treatments

## 2011-11-07 NOTE — Patient Instructions (Addendum)
Roll the affected foot over a tennis ball 20 times twice a day. After this soak the foot in warm Epsom salts for 15-20 minutes. Wear arch supports in both shoes. Sports Medicine referral if symptoms persist. Consider glucosamine sulfate 1500 mg daily for the fasciitis for 2-4 weeks ;this will rehydrate the cartilages.

## 2012-01-16 ENCOUNTER — Ambulatory Visit (INDEPENDENT_AMBULATORY_CARE_PROVIDER_SITE_OTHER): Payer: 59 | Admitting: Internal Medicine

## 2012-01-16 ENCOUNTER — Encounter: Payer: Self-pay | Admitting: Internal Medicine

## 2012-01-16 VITALS — BP 124/86 | HR 63 | Temp 98.1°F | Wt 178.8 lb

## 2012-01-16 DIAGNOSIS — N509 Disorder of male genital organs, unspecified: Secondary | ICD-10-CM

## 2012-01-16 DIAGNOSIS — I861 Scrotal varices: Secondary | ICD-10-CM

## 2012-01-16 DIAGNOSIS — M722 Plantar fascial fibromatosis: Secondary | ICD-10-CM

## 2012-01-16 MED ORDER — FINASTERIDE 5 MG PO TABS: ORAL_TABLET | ORAL | Status: DC

## 2012-01-16 NOTE — Patient Instructions (Addendum)
Review and correct the record as indicated. Please share record with all medical staff seen.  

## 2012-01-16 NOTE — Progress Notes (Signed)
  Subjective:    Patient ID: Donald Wilkins, male    DOB: 1950-03-22, 61 y.o.   MRN: [id number]  HPI TESTICULAR PAIN: Location: L scrotum Onset: few weeks ago Trigger: no   Radiation: no Severity: up to 4 Quality: dull Duration: intermittent, lasts < 1 hr  Worse with: tight pants     Past Surgeries: no GU surgery       Review of Systems Nausea/Vomiting: no  Diarrhea: no  Constipation: no Melena/BRBPR: no  Anorexia:no Fever/Chills: no Dysuria/pyuria/hematuria: no Rash: no Wt loss: no   He continues to have intermittent pain in the medial arch of the right foot despite wearing arch supports and lace up shoes;especially  at the end of the day        Objective:   Physical Exam General appearance :thin but in good health and nourishment w/o distress.  Eyes: No conjunctival inflammation or scleral icterus is present.   Abdomen: bowel sounds normal, soft and non-tender without masses, organomegaly or hernias noted.  No guarding or rebound   Skin:Warm & dry.  Intact without suspicious lesions or rashes   Lymphatic: No lymphadenopathy is noted about the head, neck, axilla, or inguinal areas.   Genitalia normal except for left varices               Assessment & Plan:  #1 intermittent left testicular pain; variceal present with out other pathology. No evidence of hernia, torsion, or epididymitis. The intermittent nature of his symptoms may suggest intermittent torsion.  #2 plantar fascial pain Plan: He'll be asked to adjust clothing to control symptoms and soaking in hot tub baths should recur. If symptoms persist or progress or there is a palpable abnormality ultrasound could be considered. At this time there is no indication for such.

## 2012-02-06 ENCOUNTER — Encounter: Payer: Self-pay | Admitting: Sports Medicine

## 2012-02-06 ENCOUNTER — Ambulatory Visit (INDEPENDENT_AMBULATORY_CARE_PROVIDER_SITE_OTHER): Payer: 59 | Admitting: Sports Medicine

## 2012-02-06 VITALS — BP 131/75 | HR 70

## 2012-02-06 DIAGNOSIS — M79671 Pain in right foot: Secondary | ICD-10-CM

## 2012-02-06 DIAGNOSIS — M79609 Pain in unspecified limb: Secondary | ICD-10-CM

## 2012-02-06 NOTE — Progress Notes (Signed)
  Subjective:    Patient ID: Donald Wilkins, male    DOB: 05/18/1950, 61 y.o.   MRN: [id number]  HPI  Pt presents to clinic for evaluation of R>L foot pain.  Started during a company softball game in september, was running and felt "snap" in mid rt arch.  Arch is worse in the mornings, feels that there is something loose in arch.  Has stopped running due to pain.  Long term runner for  Years  Also having some lt foot arch pain- not as much as on rt. Has tried a firm arch support he bought at [employer], but he could not tolerate.   He had seen Dr. Alwyn Ren who suggested he could see me if symptoms did not resolve  Review of Systems     Objective:   Physical Exam  no acute distress Standing- widening rt foot Slight loss of ht of long arch on rt Moderately high left long arch Valgus deviation rt 1st toe Hypertrophy of 1st MTP joint on rt Good post tib function bilat  Rear foot neutral bilat No abnormal callusing Mild tenderness in rt arch Good great toe motion       Assessment & Plan:

## 2012-02-06 NOTE — Assessment & Plan Note (Signed)
On today's exam the patient does not have any significant direct tenderness so I deferred ultrasound  He clearly has lost some of the longitudinal arch on the right along with some early hallux valgus shift of the great toe  We gave him a series of exercises to do to strengthen his arch We also gave him arch pads for his regular shoes along with sports insoles with arch pad on the right Arch strap  I would like him to try these changes for 6 weeks and ease back into running  I would like to recheck it in the that time but if he did not get better custom orthotics would be the next logical step

## 2012-08-19 ENCOUNTER — Encounter: Payer: Self-pay | Admitting: Internal Medicine

## 2012-09-19 ENCOUNTER — Other Ambulatory Visit: Payer: Self-pay | Admitting: Internal Medicine

## 2012-10-14 ENCOUNTER — Ambulatory Visit (INDEPENDENT_AMBULATORY_CARE_PROVIDER_SITE_OTHER): Payer: 59 | Admitting: Internal Medicine

## 2012-10-14 ENCOUNTER — Encounter: Payer: Self-pay | Admitting: Internal Medicine

## 2012-10-14 VITALS — BP 122/70 | HR 65 | Temp 98.1°F | Resp 12 | Wt 180.0 lb

## 2012-10-14 DIAGNOSIS — Z0000 Encounter for general adult medical examination without abnormal findings: Secondary | ICD-10-CM

## 2012-10-14 DIAGNOSIS — Z1211 Encounter for screening for malignant neoplasm of colon: Secondary | ICD-10-CM

## 2012-10-14 LAB — CBC WITH DIFFERENTIAL/PLATELET
Basophils Relative: 0.5 % (ref 0.0–3.0)
Eosinophils Relative: 1.2 % (ref 0.0–5.0)
HCT: 40.9 % (ref 39.0–52.0)
Hemoglobin: 13.9 g/dL (ref 13.0–17.0)
Lymphs Abs: 0.9 10*3/uL (ref 0.7–4.0)
MCV: 95 fl (ref 78.0–100.0)
Monocytes Absolute: 0.4 10*3/uL (ref 0.1–1.0)
Monocytes Relative: 9.3 % (ref 3.0–12.0)
RBC: 4.3 Mil/uL (ref 4.22–5.81)
WBC: 4.5 10*3/uL (ref 4.5–10.5)

## 2012-10-14 LAB — LIPID PANEL
Cholesterol: 179 mg/dL (ref 0–200)
LDL Cholesterol: 111 mg/dL — ABNORMAL HIGH (ref 0–99)
Total CHOL/HDL Ratio: 3
Triglycerides: 72 mg/dL (ref 0.0–149.0)

## 2012-10-14 LAB — HEPATIC FUNCTION PANEL
ALT: 15 U/L (ref 0–53)
AST: 21 U/L (ref 0–37)
Total Protein: 6.6 g/dL (ref 6.0–8.3)

## 2012-10-14 LAB — BASIC METABOLIC PANEL
Chloride: 104 mEq/L (ref 96–112)
Creatinine, Ser: 1 mg/dL (ref 0.4–1.5)
Potassium: 3.9 mEq/L (ref 3.5–5.1)

## 2012-10-14 NOTE — Patient Instructions (Addendum)

## 2012-10-14 NOTE — Addendum Note (Signed)
Addended by: Maurice Small on: 10/14/2012 10:12 AM   Modules accepted: Orders

## 2012-10-14 NOTE — Progress Notes (Signed)
  Subjective:    Patient ID: Donald Wilkins, male    DOB: 10-19-1950, 62 y.o.   MRN: [id number]  HPI  He is here for a physical;acute issues denied.     Review of Systems  Is unclear whether he is due for colonoscopic followup based on review of past medical history and affect records. He denies abdominal pain, unexplained weight loss, dysphagia, melena, rectal bleeding, or change in caliber of stools.     Objective:   Physical Exam Gen.: Healthy and well-nourished in appearance. Alert, appropriate and cooperative throughout exam. Head: Normocephalic without obvious abnormalities;  goatee  Eyes: No corneal or conjunctival inflammation noted. Pupils equal round reactive to light and accommodation. Fundal exam is benign without hemorrhages, exudate, papilledema. Extraocular motion intact. Vision grossly normal without lenses Ears: External  ear exam reveals no significant lesions or deformities. Canals clear .TMs normal. Hearing is grossly normal bilaterally. Nose: External nasal exam reveals no deformity or inflammation. Nasal mucosa are pink and moist. No lesions or exudates noted.  Mouth: Oral mucosa and oropharynx reveal no lesions or exudates. Teeth in good repair; lower partial. Neck: No deformities, masses, or tenderness noted. Range of motion & Thyroid normal. Lungs: Normal respiratory effort; chest expands symmetrically. Lungs are clear to auscultation without rales, wheezes, or increased work of breathing. Heart: Normal rate and rhythm. Normal S1 and S2. No gallop,  or rub. Classic mitral click w/o MR murmur. Abdomen: Bowel sounds normal; abdomen soft and nontender. No masses, organomegaly or hernias noted. Genitalia: Genitalia normal except for small left varices. Prostate is normal without enlargement, asymmetry, nodularity, or induration.                             Musculoskeletal/extremities: No deformity or scoliosis noted of  the thoracic or lumbar spine.  No clubbing, cyanosis,  edema, or significant extremity  deformity noted. Range of motion normal .Tone & strength  Normal. Joints normal . Nail health good. Able to lie down & sit up w/o help. Negative SLR bilaterally Vascular: Carotid, radial artery, dorsalis pedis and  posterior tibial pulses are full and equal. No bruits present. Neurologic: Alert and oriented x3. Deep tendon reflexes symmetrical and normal.         Skin: Intact without suspicious lesions or rashes. Lymph: No cervical, axillary, or inguinal lymphadenopathy present. Psych: Mood and affect are normal. Normally interactive                                                                                        Assessment & Plan:  #1 comprehensive physical exam; no acute findings  Plan: see Orders  & Recommendations

## 2012-12-26 ENCOUNTER — Other Ambulatory Visit: Payer: Self-pay | Admitting: Internal Medicine

## 2012-12-28 ENCOUNTER — Other Ambulatory Visit: Payer: Self-pay | Admitting: *Deleted

## 2012-12-28 MED ORDER — FLUOXETINE HCL 10 MG PO CAPS: ORAL_CAPSULE | ORAL | Status: DC

## 2012-12-28 NOTE — Telephone Encounter (Signed)
Fluoxetine refill sent to pharmacy 

## 2013-01-07 ENCOUNTER — Other Ambulatory Visit: Payer: Self-pay

## 2013-01-23 ENCOUNTER — Emergency Department (HOSPITAL_COMMUNITY)
Admission: EM | Admit: 2013-01-23 | Discharge: 2013-01-23 | Disposition: A | Discharge status: Home / Self Care | Payer: 59 | Attending: Emergency Medicine | Admitting: Emergency Medicine

## 2013-01-23 ENCOUNTER — Encounter (HOSPITAL_COMMUNITY): Payer: Self-pay | Admitting: Emergency Medicine

## 2013-01-23 DIAGNOSIS — E785 Hyperlipidemia, unspecified: Secondary | ICD-10-CM | POA: Insufficient documentation

## 2013-01-23 DIAGNOSIS — I059 Rheumatic mitral valve disease, unspecified: Secondary | ICD-10-CM | POA: Insufficient documentation

## 2013-01-23 DIAGNOSIS — I4510 Unspecified right bundle-branch block: Secondary | ICD-10-CM | POA: Insufficient documentation

## 2013-01-23 DIAGNOSIS — M79609 Pain in unspecified limb: Secondary | ICD-10-CM | POA: Insufficient documentation

## 2013-01-23 DIAGNOSIS — M79662 Pain in left lower leg: Secondary | ICD-10-CM

## 2013-01-23 DIAGNOSIS — Z79899 Other long term (current) drug therapy: Secondary | ICD-10-CM | POA: Insufficient documentation

## 2013-01-23 DIAGNOSIS — R791 Abnormal coagulation profile: Secondary | ICD-10-CM | POA: Insufficient documentation

## 2013-01-23 DIAGNOSIS — Z85828 Personal history of other malignant neoplasm of skin: Secondary | ICD-10-CM | POA: Insufficient documentation

## 2013-01-23 DIAGNOSIS — Z7982 Long term (current) use of aspirin: Secondary | ICD-10-CM | POA: Insufficient documentation

## 2013-01-23 DIAGNOSIS — Z87891 Personal history of nicotine dependence: Secondary | ICD-10-CM | POA: Insufficient documentation

## 2013-01-23 LAB — D-DIMER, QUANTITATIVE: D-Dimer, Quant: 1.5 ug/mL-FEU — ABNORMAL HIGH (ref 0.00–0.48)

## 2013-01-23 MED ORDER — ENOXAPARIN SODIUM 80 MG/0.8ML ~~LOC~~ SOLN
1.0000 mg/kg | Freq: Once | SUBCUTANEOUS | Status: AC
  Administered 2013-01-23: 80 mg via SUBCUTANEOUS
  Filled 2013-01-23: qty 0.8

## 2013-01-23 NOTE — ED Provider Notes (Signed)
CSN: [id number]     Arrival date & time 01/23/13  1621 History   First MD Initiated Contact with Patient 01/23/13 1713     Chief Complaint  Patient presents with  . sudden calf pain     Patient is a 62 y.o. male presenting with leg pain. The history is provided by the patient.  Leg Pain Lower extremity pain location: left calf. Pain details:    Quality:  Aching   Radiates to:  Does not radiate   Severity:  Mild   Onset quality:  Gradual   Timing:  Constant   Progression:  Unchanged Chronicity:  New Relieved by:  Rest Worsened by:  Activity Associated symptoms: no back pain, no fever, no muscle weakness, no swelling and no tingling   pt presents with left calf pain No trauma No recent travel No h/o VTE No h/o CP/SOB He reports he can ambulate but has pain No edema reported  Past Medical History  Diagnosis Date  . MVP (mitral valve prolapse)     on ECHO  . Incomplete right bundle branch block   . Hyperlipidemia   . Basal cell cancer     X 2;[facility]   Past Surgical History  Procedure Laterality Date  . Anal fissure repair    . Basal cell carcinoma excision  2011 & 2012    Mohs L face; back  . Colonoscopy with polypectomy      [facility], Dr Arlyce Dice   Family History  Problem Relation Age of Onset  . Heart attack Mother 73  . Diabetes Father   . Lung cancer Father     smoker  . Heart attack Maternal Uncle     pre 55  . Cancer Brother     brain   History  Substance Use Topics  . Smoking status: Former Smoker    Quit date: 03/05/1971  . Smokeless tobacco: Not on file     Comment: smoked  1970-1973, up to 1/2 ppd  . Alcohol Use: Yes     Comment: socially; 2-3 / week    Review of Systems  Constitutional: Negative for fever.  Respiratory: Negative for shortness of breath.   Cardiovascular: Negative for chest pain.  Gastrointestinal: Negative for blood in stool.  Musculoskeletal: Negative for back pain.  Neurological: Negative for weakness.  All other  systems reviewed and are negative.    Allergies  Review of patient's allergies indicates no known allergies.  Home Medications   Current Outpatient Rx  Name  Route  Sig  Dispense  Refill  . aspirin 81 MG tablet   Oral   Take 81 mg by mouth daily.           [name] aspirin 81 MG tablet   Oral   Take 263 mg by mouth once.          . Coenzyme Q10 (COQ10 PO)   Oral   Take 1 capsule by mouth daily.          . finasteride (PROSCAR) 5 MG tablet   Oral   Take 2.5 mg by mouth every 3 (three) days.         [name] FLUoxetine (PROZAC) 10 MG capsule   Oral   Take 10 mg by mouth daily.          BP 123/77  Pulse 57  Temp(Src) 98.1 F (36.7 C) (Oral)  Resp 18  Ht 5\' 9"  (1.753 m)  Wt 175 lb (79.379 kg)  BMI 25.83  kg/m2  SpO2 100% Physical Exam CONSTITUTIONAL: Well developed/well nourished HEAD: Normocephalic/atraumatic EYES: EOMI/PERRL ENMT: Mucous membranes moist NECK: supple no meningeal signs SPINE:entire spine nontender CV: S1/S2 noted, no murmurs/rubs/gallops noted LUNGS: Lungs are clear to auscultation bilaterally, no apparent distress ABDOMEN: soft, nontender, no rebound or guarding GU:no cva tenderness NEURO: Pt is awake/alert, moves all extremitiesx4 EXTREMITIES: pulses normal, full ROM, left calf tenderness, no LE edema noted.  No erythema, no signs of cellulitis Full ROM of both left ankle/knee without difficulty SKIN: warm, color normal PSYCH: no abnormalities of mood noted  ED Course  Procedures (including critical care time) Labs Review Labs Reviewed  D-DIMER, QUANTITATIVE - Abnormal; Notable for the following:    D-Dimer, Quant 1.50 (*)    All other components within normal limits   Imaging Review No results found.  EKG Interpretation   None       MDM   1. Calf pain, left    Nursing notes including past medical history and social history reviewed and considered in documentation Labs/vital reviewed and considered   Pt with elevated  d-dimer Will give dose of lovenox and have DVT study in the morning He denies any recent bleeding episodes I feel he is stable to receive lovenox Pt agreeable with plan    Joya Gaskins, MD 01/23/13 1940

## 2013-01-23 NOTE — ED Notes (Signed)
The pt has had lt calf pain since this am .  No known injury.  He has a history of this same pain but did not get it checked.  .  No car trips no airplane trips

## 2013-01-23 NOTE — ED Notes (Signed)
Pt discharged.Vital signs stable and GCS 15.Discharge instruction given. 

## 2013-01-24 ENCOUNTER — Ambulatory Visit (HOSPITAL_COMMUNITY)
Admission: RE | Admit: 2013-01-24 | Discharge: 2013-01-24 | Disposition: A | Discharge status: Home / Self Care | Payer: 59 | Source: Ambulatory Visit | Attending: Emergency Medicine | Admitting: Emergency Medicine

## 2013-01-24 DIAGNOSIS — M79609 Pain in unspecified limb: Secondary | ICD-10-CM

## 2013-01-24 NOTE — Progress Notes (Signed)
VASCULAR LAB PRELIMINARY  PRELIMINARY  PRELIMINARY  PRELIMINARY  Left lower extremity venous Doppler completed.    Preliminary report:  There is no DVT or SVT noted in the left lower extremity.  Khalil Belote, RVT 01/24/2013, 8:44 AM

## 2013-01-25 ENCOUNTER — Encounter: Payer: Self-pay | Admitting: Internal Medicine

## 2013-01-25 ENCOUNTER — Ambulatory Visit (INDEPENDENT_AMBULATORY_CARE_PROVIDER_SITE_OTHER): Payer: 59 | Admitting: Internal Medicine

## 2013-01-25 VITALS — BP 123/77 | HR 62 | Temp 98.0°F | Resp 18 | Wt 186.0 lb

## 2013-01-25 DIAGNOSIS — M79662 Pain in left lower leg: Secondary | ICD-10-CM

## 2013-01-25 DIAGNOSIS — M79609 Pain in unspecified limb: Secondary | ICD-10-CM

## 2013-01-25 LAB — CK: Total CK: 101 U/L (ref 7–232)

## 2013-01-25 NOTE — Progress Notes (Signed)
  Subjective:    Patient ID: Donald Wilkins, male    DOB: Feb 10, 1951, 62 y.o.   MRN: [id number]  HPI   Symptoms began 01/23/13 at 7 AM after walking from his bed to the kitchen. It was described as a sharp pain in the left lateral calf. There had been no injury or unusual activity to explain the pain. 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  He had similar symptoms approximately a year ago which was resolved with walking 15-30 minutes  He was seen in the emergency room and found to have an elevated d-dimer at 1.50. He was given a shot of Lovenox and venous Doppler scheduled for 11/23. This revealed no deep venous thrombosis.  He has no history of lower back discomfort. His past history is positive for fibrous dysplasia of the jaw.  NSAIDS not helpful.    Review of Systems  He denies any associated back pain or radicular numbness, tingling, or weakness in the leg.  He has no incontinence of urine or stool  He has no fever, chills, sweats, or weight loss.  There was no rash or change in the color or temperature of skin in the area of the pain.     Objective:   Physical Exam Gen.: Thin but healthy and well-nourished in appearance. Alert, appropriate and cooperative throughout exam.                   Musculoskeletal/extremities: No deformity or scoliosis noted of  the thoracic or lumbar spine.   No clubbing, cyanosis, edema, or significant extremity  deformity noted. Range of motion normal .Tone & strength normal. Some discomfort to compression of the left lateral calf. Hand joints normal . Fingernail / toenail health good. Able to lie down & sit up w/o help. Negative SLR bilaterally Vascular:  dorsalis pedis and  posterior tibial pulses are full and equal.Venous LLE spiders  Neurologic: Alert and oriented x3. Deep tendon reflexes symmetrical and normal.  Gait normal  including heel & toe walking .       Skin: Intact without suspicious lesions or rashes. Lymph: No  cervical, axillary lymphadenopathy present. Psych: Mood and affect are normal. Normally interactive                                                                                        Assessment & Plan:  #1 left calf pain with elevation of d-dimer but negative venous Doppler. Clinically a compartmental syndrome is suggested rather than an L5 radiculopathy.  Plan: CK, sedimentation rate. Sports medicine referral; ? MRI.

## 2013-01-25 NOTE — Patient Instructions (Addendum)
Your next office appointment will be determined based upon review of your pending labs . Those instructions will be transmitted to you through My Chart  .  I recommend a Sports Medicine consultation to determine optimal therapy. Use an anti-inflammatory cream such as Aspercreme or Zostrix cream twice a day to the affected area as needed. In lieu of this warm moist compresses or  hot water bottle can be used. Do not apply ice .

## 2013-02-22 ENCOUNTER — Encounter: Payer: Self-pay | Admitting: Sports Medicine

## 2013-02-22 ENCOUNTER — Ambulatory Visit (INDEPENDENT_AMBULATORY_CARE_PROVIDER_SITE_OTHER): Payer: 59 | Admitting: Sports Medicine

## 2013-02-22 VITALS — BP 147/80 | HR 58 | Ht 69.0 in | Wt 185.0 lb

## 2013-02-22 DIAGNOSIS — M25562 Pain in left knee: Secondary | ICD-10-CM

## 2013-02-22 DIAGNOSIS — M79669 Pain in unspecified lower leg: Secondary | ICD-10-CM

## 2013-02-22 DIAGNOSIS — M25569 Pain in unspecified knee: Secondary | ICD-10-CM | POA: Insufficient documentation

## 2013-02-22 DIAGNOSIS — M79609 Pain in unspecified limb: Secondary | ICD-10-CM

## 2013-02-22 NOTE — Progress Notes (Signed)
Patient ID: Donald Wilkins, male   DOB: 07/11/1950, 63 y.o.   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 Alwyn Ren suggested   He comes for my opinion regarding cause   Physical examination   No acute distress  BP 147/80  Pulse 58  Ht 5\' 9"  (1.753 m)  Wt 185 lb (83.915 kg)  BMI 27.31 kg/m2  Palpation is deep calf is not painful Palpation of his peroneal muscles show some tenderness midway down the lateral leg Strength on eversion plantar flexion and eversion and other motions of the foot is perfectly normal He feels some tightness with strong resistance but no real pain No swelling or redness  Ultrasound scan On the left leg about one third of the way distal on the peroneal muscles from their attachment to the fibular head there is disruption of fibers and some hypoechoic change There is no specific tear

## 2013-02-22 NOTE — Patient Instructions (Signed)
Try compression sleeve when drumming or a lot of walking  Leave on for 30 mins after you finish  Do some simple exercises with theraband  Isometrically push x table  10 x 10  If OK you don't need me  Do for 6 weeks

## 2013-02-22 NOTE — Assessment & Plan Note (Signed)
I suggested using a compression sleeve on the care for the next 6 weeks Begin some home exercises We gave him theraband to start this  I suspect this is from drumming Watch for irritation as he returns to his drumming activity  Recheck if not completely resolved in 8 weeks

## 2013-03-21 ENCOUNTER — Other Ambulatory Visit: Payer: Self-pay | Admitting: Internal Medicine

## 2013-03-22 NOTE — Telephone Encounter (Signed)
Finasteride and Fluoxetine refilled per protocol. JG//CMA

## 2013-05-21 ENCOUNTER — Other Ambulatory Visit: Payer: Self-pay

## 2013-05-25 ENCOUNTER — Encounter: Payer: Self-pay | Admitting: Internal Medicine

## 2013-05-25 DIAGNOSIS — D229 Melanocytic nevi, unspecified: Secondary | ICD-10-CM | POA: Insufficient documentation

## 2013-07-30 ENCOUNTER — Other Ambulatory Visit: Payer: Self-pay | Admitting: Internal Medicine

## 2013-07-30 MED ORDER — FLUOXETINE HCL 10 MG PO CAPS: ORAL_CAPSULE | ORAL | Status: DC

## 2013-07-30 NOTE — Telephone Encounter (Signed)
OK # [phone] 

## 2013-07-30 NOTE — Telephone Encounter (Signed)
Patient is calling to request a refill on his FLUoxetine (PROZAC) 10 MG capsule to his [place] [facility] on file. States that pharmacy sent refill request yesterday, possibly via fax. Please advise.

## 2013-09-10 ENCOUNTER — Encounter: Payer: Self-pay | Admitting: Gastroenterology

## 2013-09-28 ENCOUNTER — Encounter: Payer: Self-pay | Admitting: Gastroenterology

## 2013-10-18 ENCOUNTER — Ambulatory Visit: Payer: 59 | Admitting: Internal Medicine

## 2013-10-19 ENCOUNTER — Ambulatory Visit (INDEPENDENT_AMBULATORY_CARE_PROVIDER_SITE_OTHER): Payer: BC Managed Care – PPO | Admitting: Internal Medicine

## 2013-10-19 ENCOUNTER — Other Ambulatory Visit (INDEPENDENT_AMBULATORY_CARE_PROVIDER_SITE_OTHER): Payer: BC Managed Care – PPO

## 2013-10-19 ENCOUNTER — Encounter: Payer: Self-pay | Admitting: Internal Medicine

## 2013-10-19 VITALS — BP 124/86 | HR 65 | Temp 98.2°F | Ht 70.25 in | Wt 182.5 lb

## 2013-10-19 DIAGNOSIS — Z0000 Encounter for general adult medical examination without abnormal findings: Secondary | ICD-10-CM

## 2013-10-19 LAB — HEPATIC FUNCTION PANEL
ALK PHOS: 58 U/L (ref 39–117)
ALT: 22 U/L (ref 0–53)
AST: 25 U/L (ref 0–37)
Albumin: 4 g/dL (ref 3.5–5.2)
BILIRUBIN DIRECT: 0.2 mg/dL (ref 0.0–0.3)
TOTAL PROTEIN: 7 g/dL (ref 6.0–8.3)
Total Bilirubin: 1.2 mg/dL (ref 0.2–1.2)

## 2013-10-19 LAB — CBC WITH DIFFERENTIAL/PLATELET
BASOS ABS: 0 10*3/uL (ref 0.0–0.1)
BASOS PCT: 0.4 % (ref 0.0–3.0)
EOS ABS: 0.1 10*3/uL (ref 0.0–0.7)
Eosinophils Relative: 1.7 % (ref 0.0–5.0)
HCT: 42.8 % (ref 39.0–52.0)
Hemoglobin: 14.5 g/dL (ref 13.0–17.0)
LYMPHS PCT: 31 % (ref 12.0–46.0)
Lymphs Abs: 1.8 10*3/uL (ref 0.7–4.0)
MCHC: 33.8 g/dL (ref 30.0–36.0)
MCV: 94.1 fl (ref 78.0–100.0)
Monocytes Absolute: 0.5 10*3/uL (ref 0.1–1.0)
Monocytes Relative: 9 % (ref 3.0–12.0)
NEUTROS PCT: 57.9 % (ref 43.0–77.0)
Neutro Abs: 3.3 10*3/uL (ref 1.4–7.7)
Platelets: 262 10*3/uL (ref 150.0–400.0)
RBC: 4.55 Mil/uL (ref 4.22–5.81)
RDW: 12.8 % (ref 11.5–15.5)
WBC: 5.7 10*3/uL (ref 4.0–10.5)

## 2013-10-19 LAB — BASIC METABOLIC PANEL
BUN: 14 mg/dL (ref 6–23)
CO2: 28 meq/L (ref 19–32)
CREATININE: 0.9 mg/dL (ref 0.4–1.5)
Calcium: 9 mg/dL (ref 8.4–10.5)
Chloride: 103 mEq/L (ref 96–112)
GFR: 94.67 mL/min (ref >60.00)
Glucose, Bld: 95 mg/dL (ref 70–99)
Potassium: 4.2 mEq/L (ref 3.5–5.1)
Sodium: 138 mEq/L (ref 135–145)

## 2013-10-19 LAB — TSH: TSH: 0.69 u[IU]/mL (ref 0.35–4.50)

## 2013-10-19 LAB — LIPID PANEL
CHOL/HDL RATIO: 4
Cholesterol: 176 mg/dL (ref 0–200)
HDL: 48.1 mg/dL (ref >39.00)
LDL Cholesterol: 110 mg/dL — ABNORMAL HIGH (ref 0–99)
NONHDL: 127.9
Triglycerides: 91 mg/dL (ref 0.0–149.0)
VLDL: 18.2 mg/dL (ref 0.0–40.0)

## 2013-10-19 MED ORDER — FLUOXETINE HCL 10 MG PO CAPS: ORAL_CAPSULE | ORAL | Status: DC

## 2013-10-19 MED ORDER — FINASTERIDE 5 MG PO TABS: ORAL_TABLET | ORAL | Status: DC

## 2013-10-19 NOTE — Progress Notes (Signed)
   Subjective:    Patient ID: Donald Wilkins, male    DOB: 1951-01-04, 63 y.o.   MRN: [id number]  HPI  He is here for a physical;acute issues denied.     Review of Systems   Three weeks ago he removed a small deer tick from his ankle. He took leftover doxycycline twice a day for 2 weeks. He's had no other symptoms except for some chills last week. These have not recurred. He specifically denies significant headache, fever, or rash.  He has a past history of colon polyp. Colonoscopy is scheduled next month. Unexplained weight loss, abdominal pain, significant dyspepsia, dysphagia, melena, rectal bleeding, or persistently small caliber stools are denied.     Objective:   Physical Exam  Gen.: Healthy and well-nourished in appearance. Alert, appropriate and cooperative throughout exam. Head: Normocephalic without obvious abnormalities; pattern alopecia. Goatee & moustache  Eyes: No corneal or conjunctival inflammation noted. Pupils equal round reactive to light and accommodation. Extraocular motion intact.  Ears: External  ear exam reveals no significant lesions or deformities. Canals clear .TMs normal. Hearing is grossly normal bilaterally. Nose: External nasal exam reveals no deformity or inflammation. Nasal mucosa are pink and moist. No lesions or exudates noted.   Mouth: Oral mucosa and oropharynx reveal no lesions or exudates. Teeth in good repair.Lower partial Neck: No deformities, masses, or tenderness noted. Range of motion & Thyroid normal. Lungs: Normal respiratory effort; chest expands symmetrically. Lungs are clear to auscultation without rales, wheezes, or increased work of breathing. Heart: Normal rate and rhythm. Normal S1 and S2. No gallop,  or rub. Intermittent apical click w/omurmur. Abdomen: Bowel sounds normal; abdomen soft and nontender. No masses, organomegaly or hernias noted. Genitalia: Genitalia normal except for small left varices. Prostate is normal without enlargement,  asymmetry, nodularity, or induration                       Musculoskeletal/extremities: No deformity or scoliosis noted of  the thoracic or lumbar spine. No clubbing, cyanosis, edema, or significant extremity  deformity noted. Range of motion normal .Tone & strength normal. Hand joints normal  Fingernail  health good. Able to lie down & sit up w/o help. Negative SLR bilaterally Vascular: Carotid, radial artery, dorsalis pedis and  posterior tibial pulses are full and equal. No bruits present. Neurologic: Alert and oriented x3. Deep tendon reflexes symmetrical and normal.  Gait normal .       Skin: Intact without suspicious lesions or rashes. Lymph: No cervical, axillary, or inguinal lymphadenopathy present. Psych: Mood and affect are normal. Normally interactive                                                                                        Assessment & Plan:  #1 comprehensive physical exam; no acute findings  Plan: see Orders  & Recommendations

## 2013-10-19 NOTE — Patient Instructions (Signed)
Your next office appointment will be determined based upon review of your pending labs. Those instructions will be transmitted to you through My Chart . 

## 2013-10-19 NOTE — Progress Notes (Signed)
Pre visit review using our clinic review tool, if applicable. No additional management support is needed unless otherwise documented below in the visit note. 

## 2013-11-23 ENCOUNTER — Ambulatory Visit (AMBULATORY_SURGERY_CENTER): Payer: Self-pay

## 2013-11-23 VITALS — Ht 70.0 in | Wt 185.4 lb

## 2013-11-23 DIAGNOSIS — Z86010 Personal history of colonic polyps: Secondary | ICD-10-CM

## 2013-11-23 MED ORDER — NA SULFATE-K SULFATE-MG SULF 17.5-3.13-1.6 GM/177ML PO SOLN
ORAL | Status: DC
Start: 2013-11-23 — End: 2013-12-06

## 2013-11-23 NOTE — Progress Notes (Signed)
Per pt, no allergies to soy or egg products.Pt not taking any weight loss meds or using  O2 at home. 

## 2013-12-06 ENCOUNTER — Ambulatory Visit (AMBULATORY_SURGERY_CENTER): Payer: BC Managed Care – PPO | Admitting: Gastroenterology

## 2013-12-06 ENCOUNTER — Encounter: Payer: Self-pay | Admitting: Gastroenterology

## 2013-12-06 VITALS — BP 121/77 | HR 63 | Temp 97.5°F | Resp 18 | Ht 70.0 in | Wt 185.0 lb

## 2013-12-06 DIAGNOSIS — Z86010 Personal history of colonic polyps: Secondary | ICD-10-CM

## 2013-12-06 MED ORDER — SODIUM CHLORIDE 0.9 % IV SOLN: 500.0000 mL | INTRAVENOUS | Status: DC

## 2013-12-06 NOTE — Op Note (Signed)
[facility]  [address] [place], [id number]   COLONOSCOPY PROCEDURE REPORT  PATIENT: [place], Serpe  MR#: [id number] BIRTHDATE: 04-20-1950 , 63  yrs. old GENDER: male ENDOSCOPIST: Inda Castle, MD REFERRED BY: PROCEDURE DATE:  12/06/2013 PROCEDURE:   Colonoscopy, diagnostic First Screening Colonoscopy - Avg.  risk and is 50 yrs.  old or older - No.  Prior Negative Screening - Now for repeat screening. N/A  History of Adenoma - Now for follow-up colonoscopy & has been > or = to 3 yrs.  Yes hx of adenoma.  Has been 3 or more years since last colonoscopy.  Polyps Removed Today? No.  Recommend repeat exam, <10 yrs? Yes.  High risk (family or personal hx). ASA CLASS:   Class II INDICATIONS:high risk personal history of colonic polyps. 2010 MEDICATIONS: Monitored anesthesia care and Propofol 280 mg IV  DESCRIPTION OF PROCEDURE:   After the risks benefits and alternatives of the procedure were thoroughly explained, informed consent was obtained.  The digital rectal exam revealed no abnormalities of the rectum.   The LB [id number]  endoscope was introduced through the anus and advanced to the cecum, which was identified by both the appendix and ileocecal valve. No adverse events experienced.   The quality of the prep was Suprep good  The instrument was then slowly withdrawn as the colon was fully examined.      COLON FINDINGS: A normal appearing cecum, ileocecal valve, and appendiceal orifice were identified.  The ascending, transverse, descending, sigmoid colon, and rectum appeared unremarkable. Retroflexed views revealed no abnormalities. The time to cecum=3 minutes 14 seconds.  Withdrawal time=7 minutes 09 seconds.  The scope was withdrawn and the procedure completed. COMPLICATIONS: There were no immediate complications.  ENDOSCOPIC IMPRESSION: Normal colonoscopy  RECOMMENDATIONS: Colonoscopy 7 years  eSigned:  Inda Castle, MD 12/06/2013 8:46  AM   cc: Hendricks Limes, MD

## 2013-12-06 NOTE — Progress Notes (Signed)
Report to PACU, RN, vss, BBS= Clear.  

## 2013-12-06 NOTE — Patient Instructions (Signed)
Normal colon exam today. Repeat colonoscopy in 7 years. Resume current medications. Thank you!  YOU HAD AN ENDOSCOPIC PROCEDURE TODAY AT [facility]: Refer to the procedure report that was given to you for any specific questions about what was found during the examination.  If the procedure report does not answer your questions, please call your gastroenterologist to clarify.  If you requested that your care partner not be given the details of your procedure findings, then the procedure report has been included in a sealed envelope for you to review at your convenience later.  YOU SHOULD EXPECT: Some feelings of bloating in the abdomen. Passage of more gas than usual.  Walking can help get rid of the air that was put into your GI tract during the procedure and reduce the bloating. If you had a lower endoscopy (such as a colonoscopy or flexible sigmoidoscopy) you may notice spotting of blood in your stool or on the toilet paper. If you underwent a bowel prep for your procedure, then you may not have a normal bowel movement for a few days.  DIET: Your first meal following the procedure should be a light meal and then it is ok to progress to your normal diet.  A half-sandwich or bowl of soup is an example of a good first meal.  Heavy or fried foods are harder to digest and may make you feel nauseous or bloated.  Likewise meals heavy in dairy and vegetables can cause extra gas to form and this can also increase the bloating.  Drink plenty of fluids but you should avoid alcoholic beverages for 24 hours.  ACTIVITY: Your care partner should take you home directly after the procedure.  You should plan to take it easy, moving slowly for the rest of the day.  You can resume normal activity the day after the procedure however you should NOT DRIVE or use heavy machinery for 24 hours (because of the sedation medicines used during the test).    SYMPTOMS TO REPORT IMMEDIATELY: A gastroenterologist can  be reached at any hour.  During normal business hours, 8:30 AM to 5:00 PM Monday through Friday, call [phone].  After hours and on weekends, please call the GI answering service at [phone] who will take a message and have the physician on call contact you.   Following lower endoscopy (colonoscopy or flexible sigmoidoscopy):  Excessive amounts of blood in the stool  Significant tenderness or worsening of abdominal pains  Swelling of the abdomen that is new, acute  Fever of 100F or higher  Following upper endoscopy (EGD)  Vomiting of blood or coffee ground material  New chest pain or pain under the shoulder blades  Painful or persistently difficult swallowing  New shortness of breath  Fever of 100F or higher  Black, tarry-looking stools  FOLLOW UP: If any biopsies were taken you will be contacted by phone or by letter within the next 1-3 weeks.  Call your gastroenterologist if you have not heard about the biopsies in 3 weeks.  Our staff will call the home number listed on your records the next business day following your procedure to check on you and address any questions or concerns that you may have at that time regarding the information given to you following your procedure. This is a courtesy call and so if there is no answer at the home number and we have not heard from you through the emergency physician on call, we will assume that you  have returned to your regular daily activities without incident.  SIGNATURES/CONFIDENTIALITY: You and/or your care partner have signed paperwork which will be entered into your electronic medical record.  These signatures attest to the fact that that the information above on your After Visit Summary has been reviewed and is understood.  Full responsibility of the confidentiality of this discharge information lies with you and/or your care-partner.

## 2013-12-07 ENCOUNTER — Telehealth: Payer: Self-pay

## 2013-12-07 NOTE — Telephone Encounter (Signed)
No answer, left vm 

## 2013-12-17 ENCOUNTER — Other Ambulatory Visit: Payer: Self-pay

## 2014-03-23 ENCOUNTER — Other Ambulatory Visit: Payer: Self-pay

## 2014-11-02 ENCOUNTER — Other Ambulatory Visit (INDEPENDENT_AMBULATORY_CARE_PROVIDER_SITE_OTHER): Payer: 59

## 2014-11-02 ENCOUNTER — Ambulatory Visit (INDEPENDENT_AMBULATORY_CARE_PROVIDER_SITE_OTHER): Payer: 59 | Admitting: Internal Medicine

## 2014-11-02 ENCOUNTER — Encounter: Payer: Self-pay | Admitting: Internal Medicine

## 2014-11-02 VITALS — BP 132/86 | HR 58 | Temp 98.5°F | Resp 16 | Wt 193.0 lb

## 2014-11-02 DIAGNOSIS — Z0189 Encounter for other specified special examinations: Secondary | ICD-10-CM

## 2014-11-02 DIAGNOSIS — Z0000 Encounter for general adult medical examination without abnormal findings: Secondary | ICD-10-CM

## 2014-11-02 DIAGNOSIS — E785 Hyperlipidemia, unspecified: Secondary | ICD-10-CM | POA: Diagnosis not present

## 2014-11-02 DIAGNOSIS — R42 Dizziness and giddiness: Secondary | ICD-10-CM

## 2014-11-02 LAB — CBC WITH DIFFERENTIAL/PLATELET
Basophils Absolute: 0 10*3/uL (ref 0.0–0.1)
Basophils Relative: 0.5 % (ref 0.0–3.0)
EOS PCT: 0.7 % (ref 0.0–5.0)
Eosinophils Absolute: 0 10*3/uL (ref 0.0–0.7)
HCT: 43.2 % (ref 39.0–52.0)
HEMOGLOBIN: 14.8 g/dL (ref 13.0–17.0)
Lymphocytes Relative: 22.7 % (ref 12.0–46.0)
Lymphs Abs: 1.3 10*3/uL (ref 0.7–4.0)
MCHC: 34.3 g/dL (ref 30.0–36.0)
MCV: 93.1 fl (ref 78.0–100.0)
MONO ABS: 0.5 10*3/uL (ref 0.1–1.0)
Monocytes Relative: 8 % (ref 3.0–12.0)
Neutro Abs: 4 10*3/uL (ref 1.4–7.7)
Neutrophils Relative %: 68.1 % (ref 43.0–77.0)
Platelets: 240 10*3/uL (ref 150.0–400.0)
RBC: 4.64 Mil/uL (ref 4.22–5.81)
RDW: 13.1 % (ref 11.5–15.5)
WBC: 5.9 10*3/uL (ref 4.0–10.5)

## 2014-11-02 LAB — HEPATIC FUNCTION PANEL
ALK PHOS: 66 U/L (ref 39–117)
ALT: 21 U/L (ref 0–53)
AST: 21 U/L (ref 0–37)
Albumin: 4.4 g/dL (ref 3.5–5.2)
BILIRUBIN DIRECT: 0.1 mg/dL (ref 0.0–0.3)
BILIRUBIN TOTAL: 0.6 mg/dL (ref 0.2–1.2)
Total Protein: 6.8 g/dL (ref 6.0–8.3)

## 2014-11-02 LAB — LIPID PANEL
CHOL/HDL RATIO: 4
CHOLESTEROL: 192 mg/dL (ref 0–200)
HDL: 54 mg/dL (ref >39.00)
LDL CALC: 125 mg/dL — AB (ref 0–99)
NONHDL: 138.38
Triglycerides: 68 mg/dL (ref 0.0–149.0)
VLDL: 13.6 mg/dL (ref 0.0–40.0)

## 2014-11-02 LAB — BASIC METABOLIC PANEL
BUN: 13 mg/dL (ref 6–23)
CALCIUM: 9.4 mg/dL (ref 8.4–10.5)
CO2: 29 mEq/L (ref 19–32)
Chloride: 103 mEq/L (ref 96–112)
Creatinine, Ser: 0.94 mg/dL (ref 0.40–1.50)
GFR: 86.28 mL/min (ref >60.00)
Glucose, Bld: 91 mg/dL (ref 70–99)
Potassium: 4.1 mEq/L (ref 3.5–5.1)
Sodium: 139 mEq/L (ref 135–145)

## 2014-11-02 LAB — PSA: PSA: 0.52 ng/mL (ref 0.10–4.00)

## 2014-11-02 LAB — TSH: TSH: 0.89 u[IU]/mL (ref 0.35–4.50)

## 2014-11-02 MED ORDER — FINASTERIDE 5 MG PO TABS: ORAL_TABLET | ORAL | Status: DC

## 2014-11-02 MED ORDER — FLUOXETINE HCL 10 MG PO CAPS: ORAL_CAPSULE | ORAL | Status: DC

## 2014-11-02 NOTE — Progress Notes (Signed)
   Subjective:    Patient ID: Donald Wilkins, male    DOB: 05-30-1950, 64 y.o.   MRN: [id number]  HPI The patient is here for a physical to assess status of active health conditions.  PMH, FH, & Social History reviewed & updated.No change in [place] as recorded.  He is on a heart healthy diet. He walks for-5 times per week for 45-60 minutes without cardiopulmonary symptoms. In the last several days he's had some lightheadedness when he stands from a seated position or early morning upon arising. There is the to suggest any bleeding dyscrasias. He's had no vomiting or diarrhea.  She's been compliant with his finasteride and fluoxetine without adverse effects. Both have been of benefit to him subjectively.  His colonoscopy was in September 2 015 was negative. He has no active GI symptoms..      Review of Systems  Chest pain, palpitations, tachycardia, exertional dyspnea, paroxysmal nocturnal dyspnea, claudication or edema are absent. No unexplained weight loss, abdominal pain, significant dyspepsia, dysphagia, melena, rectal bleeding, or persistently small caliber stools. Dysuria, pyuria, hematuria, frequency, nocturia or polyuria are denied. Change in hair, skin, nails denied. No bowel changes of constipation or diarrhea. No intolerance to heat or cold.     Objective:   Physical Exam  Pertinent or positive findings include: Sitting blood pressure is  130/82 and standing 130/84. There is slight pattern alopecia. He has a Engineering geologist. Crepitus is noted of the knees. There are small varices in the left scrotum. Prostate is normal.  General appearance :adequately nourished; in no distress.  Eyes: No conjunctival inflammation or scleral icterus is present.  Oral exam:  Lips and gums are healthy appearing.There is no oropharyngeal erythema or exudate noted. Dental hygiene is good.  Heart:  Normal rate and regular rhythm. S1 and S2 normal without gallop, murmur, click, rub or other extra sounds     Lungs:Chest clear to auscultation; no wheezes, rhonchi,rales ,or rubs present.No increased work of breathing.   Abdomen: bowel sounds normal, soft and non-tender without masses, organomegaly or hernias noted.  No guarding or rebound.   Vascular : all pulses equal ; no bruits present.  Skin:Warm & dry.  Intact without suspicious lesions or rashes ; no tenting or jaundice   Lymphatic: No lymphadenopathy is noted about the head, neck, axilla, or inguinal areas.   Neuro: Strength, tone & DTRs normal.         Assessment & Plan:  #1 comprehensive physical exam; no acute findings  #2 postural lightheadedness documented hypotension posturally  Plan: see Orders  & Recommendations

## 2014-11-02 NOTE — Patient Instructions (Signed)
  Your next office appointment will be determined based upon review of your pending labs.  Those written interpretation of the lab results and instructions will be transmitted to you by My Chart Critical results will be called. Followup as needed for any active or acute issue. Please report any significant change in your symptoms. Perform isometric exercise of calves  ( while seated go up on toes to count of 5 & then onto heels for 5 count). Repeat  4- 5 times prior to standing if you've been seated or supine for any significant period of time as BP drops with such positions.

## 2014-11-02 NOTE — Progress Notes (Signed)
Pre visit review using our clinic review tool, if applicable. No additional management support is needed unless otherwise documented below in the visit note. 

## 2015-03-13 ENCOUNTER — Ambulatory Visit: Payer: 59

## 2015-03-21 ENCOUNTER — Ambulatory Visit (INDEPENDENT_AMBULATORY_CARE_PROVIDER_SITE_OTHER): Payer: BLUE CROSS/BLUE SHIELD

## 2015-03-21 DIAGNOSIS — Z418 Encounter for other procedures for purposes other than remedying health state: Secondary | ICD-10-CM | POA: Diagnosis not present

## 2015-03-21 DIAGNOSIS — Z23 Encounter for immunization: Secondary | ICD-10-CM

## 2015-03-21 DIAGNOSIS — Z299 Encounter for prophylactic measures, unspecified: Secondary | ICD-10-CM

## 2015-03-21 NOTE — Addendum Note (Signed)
Addended by: Ander Slade on: 03/21/2015 09:37 AM   Modules accepted: Orders

## 2015-07-05 ENCOUNTER — Encounter: Payer: Self-pay | Admitting: Family

## 2015-07-05 ENCOUNTER — Ambulatory Visit (INDEPENDENT_AMBULATORY_CARE_PROVIDER_SITE_OTHER): Payer: BLUE CROSS/BLUE SHIELD | Admitting: Family

## 2015-07-05 VITALS — BP 118/70 | HR 65 | Temp 98.3°F | Ht 70.0 in | Wt 189.5 lb

## 2015-07-05 DIAGNOSIS — L02512 Cutaneous abscess of left hand: Secondary | ICD-10-CM

## 2015-07-05 MED ORDER — CEPHALEXIN 500 MG PO CAPS: 500.0000 mg | ORAL_CAPSULE | Freq: Two times a day (BID) | ORAL | Status: DC

## 2015-07-05 NOTE — Patient Instructions (Signed)
Warm soaks and compresses to facilitate getting infection out of finger. Please stay very vigilant with symptoms as we discussed, any fever, chills, red streaks will be very worrisome.  If there is no improvement in your symptoms, or if there is any worsening of symptoms, or if you have any additional concerns, please return for re-evaluation; or, if we are closed, consider going to the Emergency Room for evaluation if symptoms urgent.  Incision and Drainage, Care After Refer to this sheet in the next few weeks. These instructions provide you with information on caring for yourself after your procedure. Your caregiver may also give you more specific instructions. Your treatment has been planned according to current medical practices, but problems sometimes occur. Call your caregiver if you have any problems or questions after your procedure. HOME CARE INSTRUCTIONS   If antibiotic medicine is given, take it as directed. Finish it even if you start to feel better.  Only take over-the-counter or prescription medicines for pain, discomfort, or fever as directed by your caregiver.  Keep all follow-up appointments as directed by your caregiver.  Change any bandages (dressings) as directed by your caregiver. Replace old dressings with clean dressings.  Wash your hands before and after caring for your wound. You will receive specific instructions for cleansing and caring for your wound.  SEEK MEDICAL CARE IF:   You have increased pain, swelling, or redness around the wound.  You have increased drainage, smell, or bleeding from the wound.  You have muscle aches, chills, or you feel generally sick.  You have a fever. MAKE SURE YOU:   Understand these instructions.  Will watch your condition.  Will get help right away if you are not doing well or get worse.   This information is not intended to replace advice given to you by your health care provider. Make sure you discuss any questions you  have with your health care provider.   Document Released: 05/13/2011 Document Revised: 03/11/2014 Document Reviewed: 05/13/2011 Elsevier Interactive Patient Education [employer].

## 2015-07-05 NOTE — Progress Notes (Signed)
Subjective:    Patient ID: Donald Wilkins, male    DOB: [birth year], 65 y.o.   MRN: [id number]   Donald Wilkins is a 65 y.o. male who presents today for an acute visit.    HPI Comments: Patient here for evaluation of swelling over medial side left middle finger for one week. He had been working in his wood shop and a splinter went through the top of his skin on left middle finger. No pain with Movement of finger and has full range of motion. He was able to get most of the splinter out last night. He notes improvement with warm compresses and Epsom salts soaks. Denies fever, chills, unusual body aches.     Past Medical History  Diagnosis Date  . MVP (mitral valve prolapse)     on ECHO  . Incomplete right bundle branch block   . Hyperlipidemia   . Basal cell cancer     X 2;[facility]   Review of patient's allergies indicates no known allergies. Current Outpatient Prescriptions on File Prior to Visit  Medication Sig Dispense Refill  . aspirin 81 MG tablet Take 81 mg by mouth daily.      . finasteride (PROSCAR) 5 MG tablet TAKE 1/2 TABLET BY MOUTH EVERY 2 DAYS 30 tablet 3  . FLUoxetine (PROZAC) 10 MG capsule TAKE ONE CAPSULE BY MOUTH ONCE DAILY 90 capsule 3   No current facility-administered medications on file prior to visit.    Social History  Substance Use Topics  . Smoking status: Former Smoker    Types: Cigarettes    Quit date: 03/05/1971  . Smokeless tobacco: Never Used     Comment: smoked  1970-1973, up to 1/2 ppd  . Alcohol Use: Yes     Comment: socially; 2-3 / week    Review of Systems  Constitutional: Negative for fever and chills.  Gastrointestinal: Negative for nausea and vomiting.  Musculoskeletal: Negative for myalgias and arthralgias.  Skin: Positive for wound. Negative for rash.      Objective:    Ht 5\' 10"  (1.778 m)  Wt 189 lb 8 oz (85.957 kg)  BMI 27.19 kg/m2   Physical Exam  Constitutional: He appears well-developed and well-nourished.  Cardiovascular:  Regular rhythm and normal heart sounds.   Pulmonary/Chest: Effort normal and breath sounds normal. No respiratory distress. He has no wheezes. He has no rhonchi. He has no rales.  Musculoskeletal:       Left hand: He exhibits swelling. He exhibits normal range of motion, no tenderness and no bony tenderness. Normal sensation noted. Normal strength noted.       Hands: Swelling noted between left second finger PIP and MCP. Slight ecchymosis. No draining, swelling, or tenderness with palpation. Full ROM. Sensation intact.    Lymphadenopathy:       Wilkins (left side): No submandibular and no preauricular adenopathy present.  Neurological: He is alert.  Skin: Skin is warm and dry.  Psychiatric: He has a normal mood and affect. His speech is normal and behavior is normal.  Vitals reviewed.   Patient does not  have previous history of MRSA.  Patient does not  have diabetes.   On exam, there is a 2 cm indurated abscess.   There is erythema with no discharge.  The patient gave informed consent for the procedure. Patient and I discussed risks, benefits, and alternatives to I & D.  Including risk of infection from laceration, localized pain, and bleeding. We discussed the option  for oral antibiotics. Patient verbalized understanding to conversation and all questions were answered. We jointly decided to proceed with procedure.  The area was prepped with antiseptic solution. The area was anesthetized using lidocaine. The patient tolerated the anesthetic well.    #11 blade was used to incise the abscess.  Scant purulent bloody drainage was noted. Unable to appreciate any foreign object including splinter in finger.  Bleeding from the wound was controlled by applying direct pressure with gauze.   A bandage was placed.  Wound culture was obtained.  The patient tolerated the procedure well.  Extensive instructions were given to the patient.   Complications: None  Take antibiotic as directed.  Follow-up  for wound check in 2-3 days.     Assessment & Plan:   1. Abscess of finger of left hand Decided to cover with oral antibiotics as only scant amount of purulent discharge came after incision and drainage. Discussed with patient that if any remains of splinter remain in finger, body will slowly absorb. Discussed the benefit and risk of by probing to look for small pieces of splinter, and we agree that this was not worth the risk of further damage/ injury.  - cephALEXin (KEFLEX) 500 MG capsule; Take 1 capsule (500 mg total) by mouth 2 (two) times daily.  Dispense: 14 capsule; Refill: 0   I am having Donald Wilkins maintain his aspirin, FLUoxetine, and finasteride.   No orders of the defined types were placed in this encounter.     Start medications as prescribed and explained to patient on After Visit Summary ( AVS). Risks, benefits, and alternatives of the medications and treatment plan prescribed today were discussed, and patient expressed understanding.   Education regarding symptom management and diagnosis given to patient.   Follow-up:Plan follow-up as discussed or as needed if any worsening symptoms or change in condition. No Follow-up on file.   Continue to follow with Unice Cobble, MD for routine health maintenance.   Annabell Howells and I agreed with plan.   Mable Paris, FNP

## 2015-07-05 NOTE — Progress Notes (Signed)
Pre visit review using our clinic review tool, if applicable. No additional management support is needed unless otherwise documented below in the visit note. 

## 2015-07-06 ENCOUNTER — Other Ambulatory Visit: Payer: BLUE CROSS/BLUE SHIELD

## 2015-07-06 DIAGNOSIS — L02512 Cutaneous abscess of left hand: Secondary | ICD-10-CM

## 2015-07-08 LAB — WOUND CULTURE
GRAM STAIN: NONE SEEN
Gram Stain: NONE SEEN
Gram Stain: NONE SEEN
ORGANISM ID, BACTERIA: NO GROWTH

## 2015-11-08 ENCOUNTER — Ambulatory Visit: Payer: BLUE CROSS/BLUE SHIELD | Admitting: Internal Medicine

## 2016-02-03 ENCOUNTER — Telehealth: Payer: Self-pay

## 2016-02-03 MED ORDER — FLUOXETINE HCL 10 MG PO CAPS: ORAL_CAPSULE | ORAL | 0 refills | Status: DC

## 2016-02-03 NOTE — Telephone Encounter (Signed)
erx for prozac per pt vm rq

## 2016-02-04 NOTE — Telephone Encounter (Signed)
rx sent - to establish with me this month.

## 2016-02-04 NOTE — Addendum Note (Signed)
Addended by: Binnie Rail on: 02/04/2016 09:33 AM   Modules accepted: Orders

## 2016-02-26 NOTE — Progress Notes (Signed)
Subjective:    Patient ID: Donald Wilkins, male    DOB: 04-06-1950, 65 y.o.   MRN: [id number]  HPI The patient is here for a PE.   He denies changes in his history of family history since he was here last.  He has not concerns or questions.    Medications and allergies reviewed with patient and updated if appropriate.  Patient Active Problem List   Diagnosis Date Noted  . Depression 02/27/2016  . 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 Pain in joint, lower leg 02/22/2013  . Foot pain, right 02/06/2012  . 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 SKIN CANCER, HX OF 08/07/2009  . Hyperlipidemia 09/21/2008  . Palpitations 09/18/2007  . MITRAL VALVE PROLAPSE, HX OF 05/28/2007    Current Outpatient Prescriptions on File Prior to Visit  Medication Sig Dispense Refill  . aspirin 81 MG tablet Take 81 mg by mouth daily.       No current facility-administered medications on file prior to visit.     Past Medical History:  Diagnosis Date  . Basal cell cancer    X 2;[facility]  . Hyperlipidemia   . Incomplete right bundle branch block   . MVP (mitral valve prolapse)    on ECHO    Past Surgical History:  Procedure Laterality Date  . ANAL FISSURE REPAIR    . BASAL CELL CARCINOMA EXCISION  2011 & 2012   Mohs L face; back  . colonoscopy with polypectomy  2005    GI, Dr Deatra Ina; negative 2010    Social History   Social History  . Marital status: Married    Spouse name: N/A  . Number of children: N/A  . Years of education: N/A   Social History Main Topics  . Smoking status: Former Smoker    Types: Cigarettes    Quit date: 03/05/1971  . Smokeless tobacco: Never Used     Comment: smoked  1970-1973, up to 1/2 ppd  . Alcohol use Yes     Comment: socially; 2-3 / week  . Drug use: No  . Sexual activity: Not Asked   Other Topics Concern  . None   Social History Narrative   Exercise: walks, chops wood    Family History  Problem Relation Age of Onset  . Heart attack Mother  32  . Diabetes Father   . Lung cancer Father     smoker  . Heart attack Maternal Uncle     pre 55  . Cancer Brother     malignant,brain stem  . Diabetes Sister   . Hypertension Sister     Review of Systems  Constitutional: Negative for appetite change, chills, fatigue, fever and unexpected weight change.  Eyes: Negative for visual disturbance.  Respiratory: Negative for cough, shortness of breath and wheezing.   Cardiovascular: Negative for chest pain, palpitations and leg swelling.  Gastrointestinal: Negative for abdominal pain, blood in stool, constipation, diarrhea and nausea.       Occ GERD  Genitourinary: Negative for difficulty urinating, dysuria and hematuria.  Musculoskeletal: Positive for back pain (lower back occasionally). Negative for arthralgias.  Skin: Negative for color change and rash.  Neurological: Negative for light-headedness and headaches.  Psychiatric/Behavioral: Positive for dysphoric mood (mild). The patient is not nervous/anxious.        Objective:   Vitals:   02/27/16 1009  BP: 114/70  Pulse: 68  Resp: 16  Temp: 98.1 F (36.7 C)   Filed Weights   02/27/16  1009  Weight: 199 lb (90.3 kg)   Body mass index is 28.55 kg/m.   Physical Exam    Constitutional: He appears well-developed and well-nourished. No distress.  HENT:  Head: Normocephalic and atraumatic.  Right Ear: External ear normal.  Left Ear: External ear normal.  Mouth/Throat: Oropharynx is clear and moist.  Normal ear canals and TM b/l  Eyes: Conjunctivae and EOM are normal.  Neck: Neck supple. No tracheal deviation present. No thyromegaly present.  No carotid bruit  Cardiovascular: Normal rate, regular rhythm, normal heart sounds and intact distal pulses.   No murmur heard. Pulmonary/Chest: Effort normal and breath sounds normal. No respiratory distress. He has no wheezes. He has no rales.  Abdominal: Soft. Bowel sounds are normal. He exhibits no distension. There is no  tenderness.  Genitourinary:  Normal prostate in size, no nodules Musculoskeletal: He exhibits no edema.  Lymphadenopathy:    He has no cervical adenopathy.  Skin: Skin is warm and dry. He is not diaphoretic.  Psychiatric: He has a normal mood and affect. His behavior is normal.       Assessment & Plan:    Physical exam: Screening blood work  ordered Immunizations  Flu vaccine today, other up to date Colonoscopy  Up to date  Eye exams  Up to date  Exercise - regular Weight - BMI just above normal - increase exercise, healthy diet Skin  - no concerns today,  Sees derm annually Substance abuse   - none  See Problem List for Assessment and Plan of chronic medical problems.   F/u annually

## 2016-02-27 ENCOUNTER — Ambulatory Visit (INDEPENDENT_AMBULATORY_CARE_PROVIDER_SITE_OTHER): Payer: BLUE CROSS/BLUE SHIELD | Admitting: Internal Medicine

## 2016-02-27 ENCOUNTER — Encounter: Payer: Self-pay | Admitting: Internal Medicine

## 2016-02-27 ENCOUNTER — Other Ambulatory Visit (INDEPENDENT_AMBULATORY_CARE_PROVIDER_SITE_OTHER): Payer: BLUE CROSS/BLUE SHIELD

## 2016-02-27 VITALS — BP 114/70 | HR 68 | Temp 98.1°F | Resp 16 | Ht 70.0 in | Wt 199.0 lb

## 2016-02-27 DIAGNOSIS — Z23 Encounter for immunization: Secondary | ICD-10-CM

## 2016-02-27 DIAGNOSIS — Z114 Encounter for screening for human immunodeficiency virus [HIV]: Secondary | ICD-10-CM

## 2016-02-27 DIAGNOSIS — Z0000 Encounter for general adult medical examination without abnormal findings: Secondary | ICD-10-CM

## 2016-02-27 DIAGNOSIS — F32A Depression, unspecified: Secondary | ICD-10-CM

## 2016-02-27 DIAGNOSIS — E7800 Pure hypercholesterolemia, unspecified: Secondary | ICD-10-CM

## 2016-02-27 DIAGNOSIS — Z85828 Personal history of other malignant neoplasm of skin: Secondary | ICD-10-CM

## 2016-02-27 DIAGNOSIS — Z1159 Encounter for screening for other viral diseases: Secondary | ICD-10-CM

## 2016-02-27 DIAGNOSIS — F329 Major depressive disorder, single episode, unspecified: Secondary | ICD-10-CM

## 2016-02-27 DIAGNOSIS — L649 Androgenic alopecia, unspecified: Secondary | ICD-10-CM | POA: Insufficient documentation

## 2016-02-27 LAB — CBC WITH DIFFERENTIAL/PLATELET
BASOS PCT: 0.4 % (ref 0.0–3.0)
Basophils Absolute: 0 10*3/uL (ref 0.0–0.1)
EOS ABS: 0.1 10*3/uL (ref 0.0–0.7)
EOS PCT: 1.4 % (ref 0.0–5.0)
HEMATOCRIT: 41.9 % (ref 39.0–52.0)
HEMOGLOBIN: 14.5 g/dL (ref 13.0–17.0)
Lymphocytes Relative: 24.6 % (ref 12.0–46.0)
Lymphs Abs: 1.3 10*3/uL (ref 0.7–4.0)
MCHC: 34.6 g/dL (ref 30.0–36.0)
MCV: 92.7 fl (ref 78.0–100.0)
MONO ABS: 0.5 10*3/uL (ref 0.1–1.0)
Monocytes Relative: 9.5 % (ref 3.0–12.0)
NEUTROS ABS: 3.5 10*3/uL (ref 1.4–7.7)
Neutrophils Relative %: 64.1 % (ref 43.0–77.0)
PLATELETS: 222 10*3/uL (ref 150.0–400.0)
RBC: 4.52 Mil/uL (ref 4.22–5.81)
RDW: 12.9 % (ref 11.5–15.5)
WBC: 5.5 10*3/uL (ref 4.0–10.5)

## 2016-02-27 LAB — LIPID PANEL
CHOL/HDL RATIO: 3
Cholesterol: 184 mg/dL (ref 0–200)
HDL: 57 mg/dL (ref >39.00)
LDL Cholesterol: 113 mg/dL — ABNORMAL HIGH (ref 0–99)
NONHDL: 127.36
Triglycerides: 73 mg/dL (ref 0.0–149.0)
VLDL: 14.6 mg/dL (ref 0.0–40.0)

## 2016-02-27 LAB — COMPREHENSIVE METABOLIC PANEL
ALK PHOS: 59 U/L (ref 39–117)
ALT: 21 U/L (ref 0–53)
AST: 22 U/L (ref 0–37)
Albumin: 4.2 g/dL (ref 3.5–5.2)
BILIRUBIN TOTAL: 0.6 mg/dL (ref 0.2–1.2)
BUN: 16 mg/dL (ref 6–23)
CO2: 26 meq/L (ref 19–32)
CREATININE: 0.92 mg/dL (ref 0.40–1.50)
Calcium: 9.2 mg/dL (ref 8.4–10.5)
Chloride: 105 mEq/L (ref 96–112)
GFR: 88.08 mL/min (ref >60.00)
GLUCOSE: 97 mg/dL (ref 70–99)
Potassium: 4.5 mEq/L (ref 3.5–5.1)
Sodium: 139 mEq/L (ref 135–145)
TOTAL PROTEIN: 6.7 g/dL (ref 6.0–8.3)

## 2016-02-27 LAB — TSH: TSH: 1.43 u[IU]/mL (ref 0.35–4.50)

## 2016-02-27 MED ORDER — FINASTERIDE 5 MG PO TABS: ORAL_TABLET | ORAL | 3 refills | Status: DC

## 2016-02-27 MED ORDER — FLUOXETINE HCL 10 MG PO CAPS: ORAL_CAPSULE | ORAL | 3 refills | Status: DC

## 2016-02-27 NOTE — Assessment & Plan Note (Signed)
Continue proscar daily - this has been effective

## 2016-02-27 NOTE — Assessment & Plan Note (Signed)
Check lipids 

## 2016-02-27 NOTE — Progress Notes (Signed)
Pre visit review using our clinic review tool, if applicable. No additional management support is needed unless otherwise documented below in the visit note. 

## 2016-02-27 NOTE — Patient Instructions (Signed)
Test(s) ordered today. Your results will be released to [facility] (or called to you) after review, usually within 72hours after test completion. If any changes need to be made, you will be notified at that same time.  All other Health Maintenance issues reviewed.   All recommended immunizations and age-appropriate screenings are up-to-date or discussed.  Flu immunization administered today.   Medications reviewed and updated.   No changes recommended at this time.  Your prescription(s) have been submitted to your pharmacy. Please take as directed and contact our office if you believe you are having problem(s) with the medication(s).   Please followup in one year for a physical   Health Maintenance, Male A healthy lifestyle and preventative care can promote health and wellness.  Maintain regular health, dental, and eye exams.  Eat a healthy diet. Foods like vegetables, fruits, whole grains, low-fat dairy products, and lean protein foods contain the nutrients you need and are low in calories. Decrease your intake of foods high in solid fats, added sugars, and salt. Get information about a proper diet from your health care provider, if necessary.  Regular physical exercise is one of the most important things you can do for your health. Most adults should get at least 150 minutes of moderate-intensity exercise (any activity that increases your heart rate and causes you to sweat) each week. In addition, most adults need muscle-strengthening exercises on 2 or more days a week.   Maintain a healthy weight. The body mass index (BMI) is a screening tool to identify possible weight problems. It provides an estimate of body fat based on height and weight. Your health care provider can find your BMI and can help you achieve or maintain a healthy weight. For males 20 years and older:  A BMI below 18.5 is considered underweight.  A BMI of 18.5 to 24.9 is normal.  A BMI of 25 to 29.9 is considered  overweight.  A BMI of 30 and above is considered obese.  Maintain normal blood lipids and cholesterol by exercising and minimizing your intake of saturated fat. Eat a balanced diet with plenty of fruits and vegetables. Blood tests for lipids and cholesterol should begin at age 31 and be repeated every 5 years. If your lipid or cholesterol levels are high, you are over age 26, or you are at high risk for heart disease, you may need your cholesterol levels checked more frequently.Ongoing high lipid and cholesterol levels should be treated with medicines if diet and exercise are not working.  If you smoke, find out from your health care provider how to quit. If you do not use tobacco, do not start.  Lung cancer screening is recommended for adults aged 30-80 years who are at high risk for developing lung cancer because of a history of smoking. A yearly low-dose CT scan of the lungs is recommended for people who have at least a 30-pack-year history of smoking and are current smokers or have quit within the past 15 years. A pack year of smoking is smoking an average of 1 pack of cigarettes a day for 1 year (for example, a 30-pack-year history of smoking could mean smoking 1 pack a day for 30 years or 2 packs a day for 15 years). Yearly screening should continue until the smoker has stopped smoking for at least 15 years. Yearly screening should be stopped for people who develop a health problem that would prevent them from having lung cancer treatment.  If you choose to drink alcohol,  do not have more than 2 drinks per day. One drink is considered to be 12 oz (360 mL) of beer, 5 oz (150 mL) of wine, or 1.5 oz (45 mL) of liquor.  Avoid the use of street drugs. Do not share needles with anyone. Ask for help if you need support or instructions about stopping the use of drugs.  High blood pressure causes heart disease and increases the risk of stroke. High blood pressure is more likely to develop in:  People  who have blood pressure in the end of the normal range (100-139/85-89 mm Hg).  People who are overweight or obese.  People who are African American.  If you are 84-43 years of age, have your blood pressure checked every 3-5 years. If you are 62 years of age or older, have your blood pressure checked every year. You should have your blood pressure measured twice-once when you are at a hospital or clinic, and once when you are not at a hospital or clinic. Record the average of the two measurements. To check your blood pressure when you are not at a hospital or clinic, you can use:  An automated blood pressure machine at a pharmacy.  A home blood pressure monitor.  If you are 67-82 years old, ask your health care provider if you should take aspirin to prevent heart disease.  Diabetes screening involves taking a blood sample to check your fasting blood sugar level. This should be done once every 3 years after age 101 if you are at a normal weight and without risk factors for diabetes. Testing should be considered at a younger age or be carried out more frequently if you are overweight and have at least 1 risk factor for diabetes.  Colorectal cancer can be detected and often prevented. Most routine colorectal cancer screening begins at the age of 13 and continues through age 26. However, your health care provider may recommend screening at an earlier age if you have risk factors for colon cancer. On a yearly basis, your health care provider may provide home test kits to check for hidden blood in the stool. A small camera at the end of a tube may be used to directly examine the colon (sigmoidoscopy or colonoscopy) to detect the earliest forms of colorectal cancer. Talk to your health care provider about this at age 91 when routine screening begins. A direct exam of the colon should be repeated every 5-10 years through age 66, unless early forms of precancerous polyps or small growths are found.  People  who are at an increased risk for hepatitis B should be screened for this virus. You are considered at high risk for hepatitis B if:  You were born in a country where hepatitis B occurs often. Talk with your health care provider about which countries are considered high risk.  Your parents were born in a high-risk country and you have not received a shot to protect against hepatitis B (hepatitis B vaccine).  You have HIV or AIDS.  You use needles to inject street drugs.  You live with, or have sex with, someone who has hepatitis B.  You are a man who has sex with other men (MSM).  You get hemodialysis treatment.  You take certain medicines for conditions like cancer, organ transplantation, and autoimmune conditions.  Hepatitis C blood testing is recommended for all people born from [birth year] through 1963 and any individual with known risk factors for hepatitis C.  Healthy men should no longer  receive prostate-specific antigen (PSA) blood tests as part of routine cancer screening. Talk to your health care provider about prostate cancer screening.  Testicular cancer screening is not recommended for adolescents or adult males who have no symptoms. Screening includes self-exam, a health care provider exam, and other screening tests. Consult with your health care provider about any symptoms you have or any concerns you have about testicular cancer.  Practice safe sex. Use condoms and avoid high-risk sexual practices to reduce the spread of sexually transmitted infections (STIs).  You should be screened for STIs, including gonorrhea and chlamydia if:  You are sexually active and are younger than 24 years.  You are older than 24 years, and your health care provider tells you that you are at risk for this type of infection.  Your sexual activity has changed since you were last screened, and you are at an increased risk for chlamydia or gonorrhea. Ask your health care provider if you are at  risk.  If you are at risk of being infected with HIV, it is recommended that you take a prescription medicine daily to prevent HIV infection. This is called pre-exposure prophylaxis (PrEP). You are considered at risk if:  You are a man who has sex with other men (MSM).  You are a heterosexual man who is sexually active with multiple partners.  You take drugs by injection.  You are sexually active with a partner who has HIV.  Talk with your health care provider about whether you are at high risk of being infected with HIV. If you choose to begin PrEP, you should first be tested for HIV. You should then be tested every 3 months for as long as you are taking PrEP.  Use sunscreen. Apply sunscreen liberally and repeatedly throughout the day. You should seek shade when your shadow is shorter than you. Protect yourself by wearing long sleeves, pants, a wide-brimmed hat, and sunglasses year round whenever you are outdoors.  Tell your health care provider of new moles or changes in moles, especially if there is a change in shape or color. Also, tell your health care provider if a mole is larger than the size of a pencil eraser.  A one-time screening for abdominal aortic aneurysm (AAA) and surgical repair of large AAAs by ultrasound is recommended for men aged 39-75 years who are current or former smokers.  Stay current with your vaccines (immunizations). This information is not intended to replace advice given to you by your health care provider. Make sure you discuss any questions you have with your health care provider. Document Released: 08/17/2007 Document Revised: 03/11/2014 Document Reviewed: 11/22/2014 Elsevier Interactive Patient Education  2017 [employer].

## 2016-02-27 NOTE — Assessment & Plan Note (Signed)
Controlled, stable Continue current dose of medication  

## 2016-02-27 NOTE — Assessment & Plan Note (Signed)
Sees derm annually 

## 2016-02-28 LAB — HIV ANTIBODY (ROUTINE TESTING W REFLEX): HIV 1&2 Ab, 4th Generation: NONREACTIVE

## 2016-02-28 LAB — PSA, TOTAL AND FREE
PSA, % FREE: 40 % (ref >25)
PSA, FREE: 0.2 ng/mL
PSA, TOTAL: 0.5 ng/mL (ref <=4.0)

## 2016-02-28 LAB — HEPATITIS C ANTIBODY: HCV Ab: NEGATIVE

## 2016-03-03 ENCOUNTER — Encounter: Payer: Self-pay | Admitting: Internal Medicine

## 2016-04-29 ENCOUNTER — Ambulatory Visit (INDEPENDENT_AMBULATORY_CARE_PROVIDER_SITE_OTHER): Payer: BLUE CROSS/BLUE SHIELD | Admitting: Internal Medicine

## 2016-04-29 ENCOUNTER — Ambulatory Visit (INDEPENDENT_AMBULATORY_CARE_PROVIDER_SITE_OTHER)
Admission: RE | Admit: 2016-04-29 | Discharge: 2016-04-29 | Disposition: A | Discharge status: Home / Self Care | Payer: BLUE CROSS/BLUE SHIELD | Source: Ambulatory Visit | Attending: Internal Medicine | Admitting: Internal Medicine

## 2016-04-29 ENCOUNTER — Encounter: Payer: Self-pay | Admitting: Internal Medicine

## 2016-04-29 VITALS — BP 112/70 | HR 101 | Temp 98.5°F | Resp 16 | Wt 198.0 lb

## 2016-04-29 DIAGNOSIS — J209 Acute bronchitis, unspecified: Secondary | ICD-10-CM | POA: Insufficient documentation

## 2016-04-29 MED ORDER — DOXYCYCLINE HYCLATE 100 MG PO TABS: 100.0000 mg | ORAL_TABLET | Freq: Two times a day (BID) | ORAL | 0 refills | Status: DC

## 2016-04-29 NOTE — Assessment & Plan Note (Signed)
Symptoms for over a week and getting worse-concern for bacterial bronchitis Will check chest x-ray to rule out pneumonia Start doxycycline twice a day 10 days Over-the-counter cold medication for symptomatic relief Follow-up if no improvement

## 2016-04-29 NOTE — Progress Notes (Signed)
Pre visit review using our clinic review tool, if applicable. No additional management support is needed unless otherwise documented below in the visit note. 

## 2016-04-29 NOTE — Progress Notes (Signed)
Subjective:    Patient ID: Donald Wilkins, male    DOB: 04-25-1950, 66 y.o.   MRN: [id number]  HPI He is here for an acute visit for cold symptoms.  His symptoms started just over one week ago.  He is experiencing chest congestion.  He has had low energy, decreased appetite, mild rhinorrhea, postnasal drip and nasal congestion.  He has been coughing on occasion and bringing up phlegm. He states mild body aches. He was concerned because the past several days his symptoms have seemed to get worse.  He denies fevers, shortness of breath, wheezing, chest pain or tightness. He has not had any gastrointestinal symptoms.  He has not tried taking anything for his symptoms.    Medications and allergies reviewed with patient and updated if appropriate.  Patient Active Problem List   Diagnosis Date Noted  . Depression 02/27/2016  . 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 Pain in joint, lower leg 02/22/2013  . Foot pain, right 02/06/2012  . 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 History of skin cancer 08/07/2009  . Hyperlipidemia 09/21/2008  . Palpitations 09/18/2007  . MITRAL VALVE PROLAPSE, HX OF 05/28/2007    Current Outpatient Prescriptions on File Prior to Visit  Medication Sig Dispense Refill  . aspirin 81 MG tablet Take 81 mg by mouth daily.      . finasteride (PROSCAR) 5 MG tablet TAKE 1/2 TABLET BY MOUTH EVERY 2 DAYS 90 tablet 3  . FLUoxetine (PROZAC) 10 MG capsule TAKE ONE CAPSULE BY MOUTH ONCE DAILY 90 capsule 3   No current facility-administered medications on file prior to visit.     Past Medical History:  Diagnosis Date  . Basal cell cancer    X 2;[facility]  . Hyperlipidemia   . Incomplete right bundle branch block   . MVP (mitral valve prolapse)    on ECHO    Past Surgical History:  Procedure Laterality Date  . ANAL FISSURE REPAIR    . BASAL CELL CARCINOMA EXCISION  2011 & 2012   Mohs L face; back  . colonoscopy with polypectomy   2005   [facility], Dr Deatra Ina; negative 2010    Social History   Social History  . Marital status: Married    Spouse name: N/A  . Number of children: N/A  . Years of education: N/A   Social History Main Topics  . Smoking status: Former Smoker    Types: Cigarettes    Quit date: 03/05/1971  . Smokeless tobacco: Never Used     Comment: smoked  1970-1973, up to 1/2 ppd  . Alcohol use Yes     Comment: socially; 2-3 / week  . Drug use: No  . Sexual activity: Not on file   Other Topics Concern  . Not on file   Social History Narrative   Exercise: walks, chops wood    Family History  Problem Relation Age of Onset  . Heart attack Mother 76  . Diabetes Father   . Lung cancer Father     smoker  . Heart attack Maternal Uncle     pre 55  . Cancer Brother     malignant,brain stem  . Diabetes Sister   . Hypertension Sister     Review of Systems  Constitutional: Positive for appetite change (decreased) and fatigue. Negative for chills and fever.  HENT: Positive for congestion (mild), postnasal drip and rhinorrhea. Negative for ear pain, sinus pain, sinus pressure  and sore throat.   Respiratory: Positive for cough (occasionally cough up mucus). Negative for chest tightness, shortness of breath and wheezing.        Chest congestion  Cardiovascular: Negative for chest pain.  Gastrointestinal: Negative for abdominal pain, diarrhea and nausea.  Musculoskeletal: Positive for myalgias (minor).  Neurological: Negative for light-headedness and headaches.       Objective:   Vitals:   04/29/16 1555  BP: 112/70  Pulse: (!) 101  Resp: 16  Temp: 98.5 F (36.9 C)   Filed Weights   04/29/16 1555  Weight: 198 lb (89.8 kg)   Body mass index is 28.41 kg/m.  Wt Readings from Last 3 Encounters:  04/29/16 198 lb (89.8 kg)  02/27/16 199 lb (90.3 kg)  07/05/15 189 lb 8 oz (86 kg)     Physical Exam GENERAL APPEARANCE: Appears stated age, well appearing, NAD EYES: conjunctiva  clear, no icterus HEENT: bilateral tympanic membranes and ear canals normal, oropharynx with no erythema, no thyromegaly, trachea midline, no cervical or supraclavicular lymphadenopathy LUNGS: Clear to auscultation without wheeze or crackles, unlabored breathing, good air entry bilaterally HEART: Normal S1,S2 without murmurs EXTREMITIES: Without clubbing, cyanosis, or edema        Assessment & Plan:   See Problem List for Assessment and Plan of chronic medical problems.

## 2016-04-29 NOTE — Patient Instructions (Addendum)
Have a chest xray today.  We will you with the results.   Your prescription(s) have been submitted to your pharmacy or been printed and provided for you. Please take as directed and contact our office if you believe you are having problem(s) with the medication(s) or have any questions.  If your symptoms worsen or fail to improve, please contact our office for further instruction, or in case of emergency go directly to the emergency room at the closest medical facility.   General Recommendations:    Please drink plenty of fluids.  Get plenty of rest   Sleep in humidified air  Use saline nasal sprays  Netti pot  OTC Medications:  Decongestants - helps relieve congestion   Flonase (generic fluticasone) or Nasacort (generic triamcinolone) - please make sure to use the "cross-over" technique at a 45 degree angle towards the opposite eye as opposed to straight up the nasal passageway.   Sudafed (generic pseudoephedrine - Note this is the one that is available behind the pharmacy counter); Products with phenylephrine (-PE) may also be used but is often not as effective as pseudoephedrine.   If you have HIGH BLOOD PRESSURE - Coricidin HBP; AVOID any product that is -D as this contains pseudoephedrine which may increase your blood pressure.  Afrin (oxymetazoline) every 6-8 hours for up to 3 days.  Allergies - helps relieve runny nose, itchy eyes and sneezing   Claritin (generic loratidine), Allegra (fexofenidine), or Zyrtec (generic cyrterizine) for runny nose. These medications should not cause drowsiness.  Note - Benadryl (generic diphenhydramine) may be used however may cause drowsiness  Cough -   Delsym or Robitussin (generic dextromethorphan)  Expectorants - helps loosen mucus to ease removal   Mucinex (generic guaifenesin) as directed on the package.  Headaches / General Aches   Tylenol (generic acetaminophen) - DO NOT EXCEED 3 grams (3,000 mg) in a 24  hour time period  Advil/Motrin (generic ibuprofen)  Sore Throat -   Salt water gargle   Chloraseptic (generic benzocaine) spray or lozenges / Sucrets (generic dyclonine)

## 2016-05-15 ENCOUNTER — Telehealth: Payer: Self-pay | Admitting: Internal Medicine

## 2016-05-15 NOTE — Telephone Encounter (Signed)
Please advise 

## 2016-05-15 NOTE — Telephone Encounter (Signed)
Patient called in and said that he is still not feeling 100% since he finished his abx. He would like to know if he could have something else called in. Please advise. Thank you.

## 2016-05-15 NOTE — Telephone Encounter (Signed)
He should be re-evaluated.  Most people of having a lingering cough and not feeling well. We want to avoid additional antibiotics if possible, especially if they are not needed.

## 2016-05-16 NOTE — Telephone Encounter (Signed)
Pt is scheduled for Friday 05/17/16. Gareth Eagle

## 2016-05-17 ENCOUNTER — Ambulatory Visit (INDEPENDENT_AMBULATORY_CARE_PROVIDER_SITE_OTHER): Payer: BLUE CROSS/BLUE SHIELD | Admitting: Internal Medicine

## 2016-05-17 ENCOUNTER — Encounter: Payer: Self-pay | Admitting: Internal Medicine

## 2016-05-17 VITALS — BP 114/74 | HR 63 | Temp 98.3°F | Resp 16 | Wt 194.0 lb

## 2016-05-17 DIAGNOSIS — J209 Acute bronchitis, unspecified: Secondary | ICD-10-CM | POA: Diagnosis not present

## 2016-05-17 MED ORDER — LEVOFLOXACIN 750 MG PO TABS: 750.0000 mg | ORAL_TABLET | Freq: Every day | ORAL | 0 refills | Status: DC

## 2016-05-17 MED ORDER — METHYLPREDNISOLONE 4 MG PO TBPK: ORAL_TABLET | ORAL | 0 refills | Status: DC

## 2016-05-17 NOTE — Assessment & Plan Note (Signed)
Slightly better since round of doxy, but still with significant chest congestion and some wheeze Concern for incompletely treated bronchitis with reactive airway disease Start medrol dose pack Levaquin 750 mg x 5 days Call with side effects Call if no improvement

## 2016-05-17 NOTE — Progress Notes (Signed)
Subjective:    Patient ID: Donald Wilkins, male    DOB: 06/14/1950, 66 y.o.   MRN: [id number]  HPI He is here for an acute visit.   I saw him over two weeks ago (04/29/16) and at that time he had symptoms for over one week.  He took doxycycline for ten days for presumed bacterial bronchitis.  A chest xray on that day was normal.  He did not see much improvement in his symptoms except his energy is better.   When he exerts himself he coughs more.  He is bringing up some phlegm that is whitish.  He stills feels like he has a lot of chest congestion. He is wheezing.  His cough is not getting better.   He denies fever, chills, sob, ear pain, sinus pain and sore throat.      Medications and allergies reviewed with patient and updated if appropriate.  Patient Active Problem List   Diagnosis Date Noted  . Acute bronchitis 04/29/2016  . Depression 02/27/2016  . 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 Pain in joint, lower leg 02/22/2013  . Foot pain, right 02/06/2012  . 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 History of skin cancer 08/07/2009  . Hyperlipidemia 09/21/2008  . Palpitations 09/18/2007  . MITRAL VALVE PROLAPSE, HX OF 05/28/2007    Current Outpatient Prescriptions on File Prior to Visit  Medication Sig Dispense Refill  . aspirin 81 MG tablet Take 81 mg by mouth daily.      . finasteride (PROSCAR) 5 MG tablet TAKE 1/2 TABLET BY MOUTH EVERY 2 DAYS 90 tablet 3  . FLUoxetine (PROZAC) 10 MG capsule TAKE ONE CAPSULE BY MOUTH ONCE DAILY 90 capsule 3   No current facility-administered medications on file prior to visit.     Past Medical History:  Diagnosis Date  . Basal cell cancer    X 2;[facility]  . Hyperlipidemia   . Incomplete right bundle branch block   . MVP (mitral valve prolapse)    on ECHO    Past Surgical History:  Procedure Laterality Date  . ANAL FISSURE REPAIR    . BASAL CELL CARCINOMA EXCISION  2011 & 2012   Mohs L face; back  .  colonoscopy with polypectomy  2005   [facility], Dr Deatra Ina; negative 2010    Social History   Social History  . Marital status: Married    Spouse name: N/A  . Number of children: N/A  . Years of education: N/A   Social History Main Topics  . Smoking status: Former Smoker    Types: Cigarettes    Quit date: 03/05/1971  . Smokeless tobacco: Never Used     Comment: smoked  1970-1973, up to 1/2 ppd  . Alcohol use Yes     Comment: socially; 2-3 / week  . Drug use: No  . Sexual activity: Not on file   Other Topics Concern  . Not on file   Social History Narrative   Exercise: walks, chops wood    Family History  Problem Relation Age of Onset  . Heart attack Mother 84  . Diabetes Father   . Lung cancer Father     smoker  . Heart attack Maternal Uncle     pre 55  . Cancer Brother     malignant,brain stem  . Diabetes Sister   . Hypertension Sister     Review of Systems  Constitutional: Negative for appetite change, fatigue  and fever.  HENT: Positive for congestion. Negative for ear pain, sinus pain and sore throat.   Respiratory: Positive for cough and wheezing. Negative for shortness of breath.   Gastrointestinal: Negative for abdominal pain, diarrhea and nausea.  Neurological: Negative for light-headedness and headaches.       Objective:   Vitals:   05/17/16 1047  BP: 114/74  Pulse: 63  Resp: 16  Temp: 98.3 F (36.8 C)   Filed Weights   05/17/16 1047  Weight: 194 lb (88 kg)   Body mass index is 27.84 kg/m.  Wt Readings from Last 3 Encounters:  05/17/16 194 lb (88 kg)  04/29/16 198 lb (89.8 kg)  02/27/16 199 lb (90.3 kg)     Physical Exam GENERAL APPEARANCE: Appears stated age, well appearing, NAD EYES: conjunctiva clear, no icterus HEENT: bilateral tympanic membranes and ear canals normal, oropharynx with no erythema, no thyromegaly, trachea midline, no cervical or supraclavicular lymphadenopathy LUNGS: Uunlabored breathing, good air entry  bilaterally, mild wheeze and congestion heard in chest HEART: Normal S1,S2 without murmurs EXTREMITIES: Without clubbing, cyanosis, or edema        Assessment & Plan:   See Problem List for Assessment and Plan of chronic medical problems.

## 2016-05-17 NOTE — Patient Instructions (Signed)
Take the antibiotic and steroid taper as prescribed.   If your symptoms do not improve please call.

## 2016-05-17 NOTE — Progress Notes (Signed)
Pre visit review using our clinic review tool, if applicable. No additional management support is needed unless otherwise documented below in the visit note. 

## 2016-11-07 ENCOUNTER — Ambulatory Visit: Payer: BLUE CROSS/BLUE SHIELD | Admitting: Sports Medicine

## 2017-02-27 NOTE — Progress Notes (Signed)
Subjective:    Patient ID: Donald Wilkins, male    DOB: 1950/04/11, 66 y.o.   MRN: [id number]  HPI He is here for a physical exam.   He denies any changes in his history or family history.  He has no concerns or questions.  Overall he feels well.  Medications and allergies reviewed with patient and updated if appropriate.  Patient Active Problem List   Diagnosis Date Noted  . Depression 02/27/2016  . 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 Pain in joint, lower leg 02/22/2013  . Foot pain, right 02/06/2012  . 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 History of skin cancer 08/07/2009  . Hyperlipidemia 09/21/2008  . Palpitations 09/18/2007  . MITRAL VALVE PROLAPSE, HX OF 05/28/2007    Current Outpatient Medications on File Prior to Visit  Medication Sig Dispense Refill  . aspirin 81 MG tablet Take 81 mg by mouth daily.      . finasteride (PROSCAR) 5 MG tablet TAKE 1/2 TABLET BY MOUTH EVERY 2 DAYS 90 tablet 3  . FLUoxetine (PROZAC) 10 MG capsule TAKE ONE CAPSULE BY MOUTH ONCE DAILY 90 capsule 3   No current facility-administered medications on file prior to visit.     Past Medical History:  Diagnosis Date  . Basal cell cancer    X 2;[facility]  . Hyperlipidemia   . Incomplete right bundle branch block   . MVP (mitral valve prolapse)    on ECHO    Past Surgical History:  Procedure Laterality Date  . ANAL FISSURE REPAIR    . BASAL CELL CARCINOMA EXCISION  2011 & 2012   Mohs L face; back  . colonoscopy with polypectomy  2005   [facility], Dr Deatra Ina; negative 2010    Social History   Socioeconomic History  . Marital status: Married    Spouse name: None  . Number of children: None  . Years of education: None  . Highest education level: None  Social Needs  . Financial resource strain: None  . Food insecurity - worry: None  . Food insecurity - inability: None  . Transportation needs - medical: None  . Transportation needs - non-medical:  None  Occupational History  . None  Tobacco Use  . Smoking status: Former Smoker    Types: Cigarettes    Last attempt to quit: 03/05/1971    Years since quitting: 46.0  . Smokeless tobacco: Never Used  . Tobacco comment: smoked  1970-1973, up to 1/2 ppd  Substance and Sexual Activity  . Alcohol use: Yes    Comment: socially; 2-3 / week  . Drug use: No  . Sexual activity: None  Other Topics Concern  . None  Social History Narrative   Exercise: walks, chops wood    Family History  Problem Relation Age of Onset  . Heart attack Mother 27  . Diabetes Father   . Lung cancer Father        smoker  . Heart attack Maternal Uncle        pre 55  . Cancer Brother        malignant,brain stem  . Diabetes Sister   . Hypertension Sister     Review of Systems  Constitutional: Negative for appetite change, chills, fatigue, fever and unexpected weight change.  Eyes: Negative for visual disturbance.  Respiratory: Negative for cough, shortness of breath and wheezing.   Cardiovascular: Negative for chest pain, palpitations and leg swelling.  Gastrointestinal:  Negative for abdominal pain, blood in stool, constipation, diarrhea and nausea.       Gerd occ - takes tums  Genitourinary: Negative for dysuria and hematuria.  Musculoskeletal: Negative for arthralgias and back pain.  Skin: Negative for color change and rash.  Neurological: Negative for light-headedness and headaches.  Psychiatric/Behavioral: Negative for dysphoric mood. The patient is not nervous/anxious.        Objective:   Vitals:   02/28/17 1042  BP: 114/68  Pulse: 78  Resp: 16  Temp: 98 F (36.7 C)  SpO2: 98%   Filed Weights   02/28/17 1042  Weight: 199 lb (90.3 kg)   Body mass index is 28.55 kg/m.  Wt Readings from Last 3 Encounters:  02/28/17 199 lb (90.3 kg)  05/17/16 194 lb (88 kg)  04/29/16 198 lb (89.8 kg)     Physical Exam Constitutional: He appears well-developed and well-nourished. No distress.    HENT:  Head: Normocephalic and atraumatic.  Right Ear: External ear normal.  Left Ear: External ear normal.  Mouth/Throat: Oropharynx is clear and moist.  Normal ear canals and TM b/l  Eyes: Conjunctivae and EOM are normal.  Neck: Neck supple. No tracheal deviation present. No thyromegaly present.  No carotid bruit  Cardiovascular: Normal rate, regular rhythm, normal heart sounds and intact distal pulses.   No murmur heard. Pulmonary/Chest: Effort normal and breath sounds normal. No respiratory distress. He has no wheezes. He has no rales.  Abdominal: Soft. He exhibits no distension. There is no tenderness.  Genitourinary: normal prostate, no nodules Musculoskeletal: He exhibits no edema.  Lymphadenopathy:   He has no cervical adenopathy.  Skin: Skin is warm and dry. He is not diaphoretic.  Psychiatric: He has a normal mood and affect. His behavior is normal.         Assessment & Plan:   Physical exam: Screening blood work ordered Immunizations   Discussed shingrix, flu vaccine today Colonoscopy   Up to date  Eye exams  Up to date  EKG   Last done 2013 Exercise  Walking, yard work - cuts a lot of fire wood.  Thinking about joining the gym Weight   Good for age-continue healthy diet and regular exercise Skin no concerns, sees derm annually Substance abuse  none  See Problem List for Assessment and Plan of chronic medical problems.  Follow-up annually

## 2017-02-27 NOTE — Patient Instructions (Addendum)
Test(s) ordered today. Your results will be released to [facility] (or called to you) after review, usually within 72hours after test completion. If any changes need to be made, you will be notified at that same time.  All other Health Maintenance issues reviewed.   All recommended immunizations and age-appropriate screenings are up-to-date or discussed.  Flu immunization administered today.   Medications reviewed and updated.  No changes recommended at this time.  Your prescription(s) have been submitted to your pharmacy. Please take as directed and contact our office if you believe you are having problem(s) with the medication(s).   Please followup in one year    Health Maintenance, Male A healthy lifestyle and preventive care is important for your health and wellness. Ask your health care provider about what schedule of regular examinations is right for you. What should I know about weight and diet? Eat a Healthy Diet  Eat plenty of vegetables, fruits, whole grains, low-fat dairy products, and lean protein.  Do not eat a lot of foods high in solid fats, added sugars, or salt.  Maintain a Healthy Weight Regular exercise can help you achieve or maintain a healthy weight. You should:  Do at least 150 minutes of exercise each week. The exercise should increase your heart rate and make you sweat (moderate-intensity exercise).  Do strength-training exercises at least twice a week.  Watch Your Levels of Cholesterol and Blood Lipids  Have your blood tested for lipids and cholesterol every 5 years starting at 64 years of age. If you are at high risk for heart disease, you should start having your blood tested when you are 64 years old. You may need to have your cholesterol levels checked more often if: ? Your lipid or cholesterol levels are high. ? You are older than 64 years of age. ? You are at high risk for heart disease.  What should I know about cancer screening? Many types of  cancers can be detected early and may often be prevented. Lung Cancer  You should be screened every year for lung cancer if: ? You are a current smoker who has smoked for at least 30 years. ? You are a former smoker who has quit within the past 15 years.  Talk to your health care provider about your screening options, when you should start screening, and how often you should be screened.  Colorectal Cancer  Routine colorectal cancer screening usually begins at 64 years of age and should be repeated every 5-10 years until you are 64 years old. You may need to be screened more often if early forms of precancerous polyps or small growths are found. Your health care provider may recommend screening at an earlier age if you have risk factors for colon cancer.  Your health care provider may recommend using home test kits to check for hidden blood in the stool.  A small camera at the end of a tube can be used to examine your colon (sigmoidoscopy or colonoscopy). This checks for the earliest forms of colorectal cancer.  Prostate and Testicular Cancer  Depending on your age and overall health, your health care provider may do certain tests to screen for prostate and testicular cancer.  Talk to your health care provider about any symptoms or concerns you have about testicular or prostate cancer.  Skin Cancer  Check your skin from head to toe regularly.  Tell your health care provider about any new moles or changes in moles, especially if: ? There is a  change in a mole's size, shape, or color. ? You have a mole that is larger than a pencil eraser.  Always use sunscreen. Apply sunscreen liberally and repeat throughout the day.  Protect yourself by wearing long sleeves, pants, a wide-brimmed hat, and sunglasses when outside.  What should I know about heart disease, diabetes, and high blood pressure?  If you are 18-39 years of age, have your blood pressure checked every 3-5 years. If you are  40 years of age or older, have your blood pressure checked every year. You should have your blood pressure measured twice-once when you are at a hospital or clinic, and once when you are not at a hospital or clinic. Record the average of the two measurements. To check your blood pressure when you are not at a hospital or clinic, you can use: ? An automated blood pressure machine at a pharmacy. ? A home blood pressure monitor.  Talk to your health care provider about your target blood pressure.  If you are between 45-79 years old, ask your health care provider if you should take aspirin to prevent heart disease.  Have regular diabetes screenings by checking your fasting blood sugar level. ? If you are at a normal weight and have a low risk for diabetes, have this test once every three years after the age of 45. ? If you are overweight and have a high risk for diabetes, consider being tested at a younger age or more often.  A one-time screening for abdominal aortic aneurysm (AAA) by ultrasound is recommended for men aged 65-75 years who are current or former smokers. What should I know about preventing infection? Hepatitis B If you have a higher risk for hepatitis B, you should be screened for this virus. Talk with your health care provider to find out if you are at risk for hepatitis B infection. Hepatitis C Blood testing is recommended for:  Everyone born from 1945 through 1965.  Anyone with known risk factors for hepatitis C.  Sexually Transmitted Diseases (STDs)  You should be screened each year for STDs including gonorrhea and chlamydia if: ? You are sexually active and are younger than 64 years of age. ? You are older than 64 years of age and your health care provider tells you that you are at risk for this type of infection. ? Your sexual activity has changed since you were last screened and you are at an increased risk for chlamydia or gonorrhea. Ask your health care provider if you  are at risk.  Talk with your health care provider about whether you are at high risk of being infected with HIV. Your health care provider may recommend a prescription medicine to help prevent HIV infection.  What else can I do?  Schedule regular health, dental, and eye exams.  Stay current with your vaccines (immunizations).  Do not use any tobacco products, such as cigarettes, chewing tobacco, and e-cigarettes. If you need help quitting, ask your health care provider.  Limit alcohol intake to no more than 2 drinks per day. One drink equals 12 ounces of beer, 5 ounces of wine, or 1 ounces of hard liquor.  Do not use street drugs.  Do not share needles.  Ask your health care provider for help if you need support or information about quitting drugs.  Tell your health care provider if you often feel depressed.  Tell your health care provider if you have ever been abused or do not feel safe at home.   This information is not intended to replace advice given to you by your health care provider. Make sure you discuss any questions you have with your health care provider. Document Released: 08/17/2007 Document Revised: 10/18/2015 Document Reviewed: 11/22/2014 Elsevier Interactive Patient Education  2018 [employer]  

## 2017-02-28 ENCOUNTER — Other Ambulatory Visit (INDEPENDENT_AMBULATORY_CARE_PROVIDER_SITE_OTHER): Payer: BLUE CROSS/BLUE SHIELD

## 2017-02-28 ENCOUNTER — Ambulatory Visit (INDEPENDENT_AMBULATORY_CARE_PROVIDER_SITE_OTHER): Payer: BLUE CROSS/BLUE SHIELD | Admitting: Internal Medicine

## 2017-02-28 ENCOUNTER — Encounter: Payer: Self-pay | Admitting: Internal Medicine

## 2017-02-28 VITALS — BP 114/68 | HR 78 | Temp 98.0°F | Resp 16 | Ht 70.0 in | Wt 199.0 lb

## 2017-02-28 DIAGNOSIS — E7849 Other hyperlipidemia: Secondary | ICD-10-CM | POA: Diagnosis not present

## 2017-02-28 DIAGNOSIS — Z85828 Personal history of other malignant neoplasm of skin: Secondary | ICD-10-CM

## 2017-02-28 DIAGNOSIS — F32A Depression, unspecified: Secondary | ICD-10-CM

## 2017-02-28 DIAGNOSIS — Z23 Encounter for immunization: Secondary | ICD-10-CM

## 2017-02-28 DIAGNOSIS — F329 Major depressive disorder, single episode, unspecified: Secondary | ICD-10-CM

## 2017-02-28 DIAGNOSIS — Z0000 Encounter for general adult medical examination without abnormal findings: Secondary | ICD-10-CM | POA: Diagnosis not present

## 2017-02-28 DIAGNOSIS — L649 Androgenic alopecia, unspecified: Secondary | ICD-10-CM

## 2017-02-28 LAB — LIPID PANEL
CHOLESTEROL: 177 mg/dL (ref 0–200)
HDL: 54.2 mg/dL (ref >39.00)
LDL CALC: 111 mg/dL — AB (ref 0–99)
NonHDL: 122.89
TRIGLYCERIDES: 59 mg/dL (ref 0.0–149.0)
Total CHOL/HDL Ratio: 3
VLDL: 11.8 mg/dL (ref 0.0–40.0)

## 2017-02-28 LAB — COMPREHENSIVE METABOLIC PANEL
ALBUMIN: 4.2 g/dL (ref 3.5–5.2)
ALK PHOS: 61 U/L (ref 39–117)
ALT: 19 U/L (ref 0–53)
AST: 23 U/L (ref 0–37)
BUN: 15 mg/dL (ref 6–23)
CALCIUM: 8.8 mg/dL (ref 8.4–10.5)
CHLORIDE: 104 meq/L (ref 96–112)
CO2: 30 mEq/L (ref 19–32)
CREATININE: 0.95 mg/dL (ref 0.40–1.50)
GFR: 84.61 mL/min (ref >60.00)
Glucose, Bld: 94 mg/dL (ref 70–99)
POTASSIUM: 4.5 meq/L (ref 3.5–5.1)
Sodium: 139 mEq/L (ref 135–145)
TOTAL PROTEIN: 6.6 g/dL (ref 6.0–8.3)
Total Bilirubin: 0.8 mg/dL (ref 0.2–1.2)

## 2017-02-28 LAB — CBC WITH DIFFERENTIAL/PLATELET
BASOS PCT: 0.7 % (ref 0.0–3.0)
Basophils Absolute: 0 10*3/uL (ref 0.0–0.1)
EOS ABS: 0.1 10*3/uL (ref 0.0–0.7)
EOS PCT: 2.3 % (ref 0.0–5.0)
HEMATOCRIT: 43.9 % (ref 39.0–52.0)
HEMOGLOBIN: 14.4 g/dL (ref 13.0–17.0)
LYMPHS PCT: 24.1 % (ref 12.0–46.0)
Lymphs Abs: 1.2 10*3/uL (ref 0.7–4.0)
MCHC: 32.7 g/dL (ref 30.0–36.0)
MCV: 96.8 fl (ref 78.0–100.0)
Monocytes Absolute: 0.6 10*3/uL (ref 0.1–1.0)
Monocytes Relative: 11.1 % (ref 3.0–12.0)
Neutro Abs: 3.1 10*3/uL (ref 1.4–7.7)
Neutrophils Relative %: 61.8 % (ref 43.0–77.0)
Platelets: 220 10*3/uL (ref 150.0–400.0)
RBC: 4.53 Mil/uL (ref 4.22–5.81)
RDW: 12.9 % (ref 11.5–15.5)
WBC: 5 10*3/uL (ref 4.0–10.5)

## 2017-02-28 LAB — TSH: TSH: 1.53 u[IU]/mL (ref 0.35–4.50)

## 2017-02-28 MED ORDER — FINASTERIDE 5 MG PO TABS: ORAL_TABLET | ORAL | 3 refills | Status: DC

## 2017-02-28 MED ORDER — FLUOXETINE HCL 10 MG PO CAPS: ORAL_CAPSULE | ORAL | 3 refills | Status: DC

## 2017-02-28 NOTE — Assessment & Plan Note (Signed)
Taking finasteride Stable, controlled Continue low-dose finasteride

## 2017-02-28 NOTE — Assessment & Plan Note (Signed)
Controlled, stable Continue current dose of medication  

## 2017-02-28 NOTE — Assessment & Plan Note (Signed)
Sees dermatology regularly

## 2017-02-28 NOTE — Assessment & Plan Note (Signed)
Controlled with lifestyle Check lipids, cmp, tsh

## 2017-03-03 ENCOUNTER — Encounter: Payer: Self-pay | Admitting: Internal Medicine

## 2017-03-03 LAB — PSA, TOTAL AND FREE
PSA, % FREE: 17 % — AB (ref >25)
PSA, Free: 0.1 ng/mL
PSA, Total: 0.6 ng/mL (ref <=4.0)

## 2017-05-29 DIAGNOSIS — D2262 Melanocytic nevi of left upper limb, including shoulder: Secondary | ICD-10-CM | POA: Diagnosis not present

## 2017-05-29 DIAGNOSIS — Z85828 Personal history of other malignant neoplasm of skin: Secondary | ICD-10-CM | POA: Diagnosis not present

## 2017-05-29 DIAGNOSIS — D1801 Hemangioma of skin and subcutaneous tissue: Secondary | ICD-10-CM | POA: Diagnosis not present

## 2017-05-29 DIAGNOSIS — L820 Inflamed seborrheic keratosis: Secondary | ICD-10-CM | POA: Diagnosis not present

## 2017-05-29 DIAGNOSIS — D2272 Melanocytic nevi of left lower limb, including hip: Secondary | ICD-10-CM | POA: Diagnosis not present

## 2017-05-29 DIAGNOSIS — L814 Other melanin hyperpigmentation: Secondary | ICD-10-CM | POA: Diagnosis not present

## 2017-05-29 DIAGNOSIS — L821 Other seborrheic keratosis: Secondary | ICD-10-CM | POA: Diagnosis not present

## 2017-07-01 DIAGNOSIS — R69 Illness, unspecified: Secondary | ICD-10-CM | POA: Diagnosis not present

## 2017-07-17 DIAGNOSIS — D485 Neoplasm of uncertain behavior of skin: Secondary | ICD-10-CM | POA: Diagnosis not present

## 2017-07-17 DIAGNOSIS — L820 Inflamed seborrheic keratosis: Secondary | ICD-10-CM | POA: Diagnosis not present

## 2017-07-17 DIAGNOSIS — Z85828 Personal history of other malignant neoplasm of skin: Secondary | ICD-10-CM | POA: Diagnosis not present

## 2018-01-01 DIAGNOSIS — R69 Illness, unspecified: Secondary | ICD-10-CM | POA: Diagnosis not present

## 2018-02-05 ENCOUNTER — Ambulatory Visit (INDEPENDENT_AMBULATORY_CARE_PROVIDER_SITE_OTHER): Payer: Medicare HMO

## 2018-02-05 DIAGNOSIS — Z23 Encounter for immunization: Secondary | ICD-10-CM | POA: Diagnosis not present

## 2018-03-17 NOTE — Progress Notes (Signed)
Subjective:    Patient ID: Donald Wilkins, male    DOB: Jun 13, 1950, 68 y.o.   MRN: [id number]  HPI  Here for medicare wellness exam.   I have personally reviewed and have noted 1.The patient's medical and social history 2.Their use of alcohol, tobacco or illicit drugs 3.Their current medications and supplements 4.The patient's functional ability including ADL's, fall risks, home                 safety risk and hearing or visual impairment. 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 He wonders what the treatment options are.  He is taking oste biflex and omega supplements.     Are there smokers in your home (other than you)? No  Risk Factors Exercise:  Walking, heats house with wood Dietary issues discussed:  Balanced, avoid junk  Vitamin and supplement use:  Omega, osteobioflex  Opiod use:   n/a  Cardiac risk factors: advanced age  Depression Screen  Have you felt down, depressed or hopeless? No  Have you felt little interest or pleasure in doing things?  No  Activities of Daily Living In your present state of health, do you have any difficulty performing the following activities?:  Driving? No Managing money?  No Feeding yourself? No Getting from bed to chair? No Climbing a flight of stairs? No Preparing food and eating?: No Bathing or showering? No Getting dressed: No Getting to/using the toilet? No Moving around from place to place: No In the past year have you fallen or had a near fall?: No   Do you have more than one partner?  No  Hearing Difficulties: No Do you often ask people to speak up or repeat themselves? No Do you experience ringing or noises in your ears? yes Do you have difficulty understanding soft or whispered voices? No Vision:              Any  change in vision:  no             Up to date with eye exam: yes   Memory:  Do you feel that you have a problem with memory? No  Do you often misplace items? No  Do you feel safe at home?  Yes  Cognitive Testing  Alert, Orientated? Yes  Normal Appearance? Yes  Recall of three objects?  Yes  Can perform simple calculations? Yes  Displays appropriate judgment? Yes  Can read the correct time from a watch face? Yes   Advanced Directives have been discussed with the patient? Yes - not in place     Medications and allergies reviewed with patient and updated if appropriate.  Patient Active Problem List   Diagnosis Date Noted  . Depression 02/27/2016  . 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 Pain in joint, lower leg 02/22/2013  . Foot pain, right 02/06/2012  . 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 History of skin cancer 08/07/2009  . Palpitations 09/18/2007  . MITRAL VALVE PROLAPSE, HX OF 05/28/2007    Current Outpatient Medications on File Prior to Visit  Medication Sig Dispense Refill  . aspirin 81 MG tablet Take 81 mg by mouth daily.       No current facility-administered medications on file prior to visit.     Past Medical History:  Diagnosis  Date  . Basal cell cancer    X 2;[facility]  . Hyperlipidemia   . Incomplete right bundle branch block   . MVP (mitral valve prolapse)    on ECHO    Past Surgical History:  Procedure Laterality Date  . ANAL FISSURE REPAIR    . BASAL CELL CARCINOMA EXCISION  2011 & 2012   Mohs L face; back  . colonoscopy with polypectomy  2005   [facility], Dr Deatra Ina; negative 2010    Social History   Socioeconomic History  . Marital status: Married    Spouse name: Not on file  . Number of children: Not on file  . Years of education: Not on file  . Highest education level: Not on file  Occupational History  . Not on file  Social Needs  . Financial resource strain: Not on file  . Food insecurity:     Worry: Not on file    Inability: Not on file  . Transportation needs:    Medical: Not on file    Non-medical: Not on file  Tobacco Use  . Smoking status: Former Smoker    Types: Cigarettes    Last attempt to quit: 03/05/1971    Years since quitting: 47.0  . Smokeless tobacco: Never Used  . Tobacco comment: smoked  1970-1973, up to 1/2 ppd  Substance and Sexual Activity  . Alcohol use: Yes    Comment: socially; 2-3 / week  . Drug use: No  . Sexual activity: Not on file  Lifestyle  . Physical activity:    Days per week: Not on file    Minutes per session: Not on file  . Stress: Not on file  Relationships  . Social connections:    Talks on phone: Not on file    Gets together: Not on file    Attends religious service: Not on file    Active member of club or organization: Not on file    Attends meetings of clubs or organizations: Not on file    Relationship status: Not on file  Other Topics Concern  . Not on file  Social History Narrative   Exercise: walks, chops wood    Family History  Problem Relation Age of Onset  . Heart attack Mother 78  . Diabetes Father   . Lung cancer Father        smoker  . Heart attack Maternal Uncle        pre 55  . Cancer Brother        malignant,brain stem  . Diabetes Sister   . Hypertension Sister     Review of Systems  Constitutional: Negative for chills and fever.  HENT: Positive for tinnitus. Negative for hearing loss.   Eyes: Negative for visual disturbance.  Respiratory: Negative for cough, shortness of breath and wheezing.   Cardiovascular: Negative for chest pain, palpitations and leg swelling.  Gastrointestinal: Negative for abdominal pain, blood in stool, constipation, diarrhea and nausea.       Rare gerd  Genitourinary: Negative for difficulty urinating, dysuria and hematuria.  Musculoskeletal: Positive for arthralgias (right thumb) and back pain (lower back).  Skin: Negative for color change and rash.  Neurological:  Negative for light-headedness and headaches.  Psychiatric/Behavioral: Negative for dysphoric mood. The patient is not nervous/anxious.        Objective:   Vitals:   03/18/18 0810  BP: 122/78  Pulse: (!) 54  Resp: 16  Temp: 98 F (36.7 C)  SpO2: 99%  Filed Weights   03/18/18 0810  Weight: 194 lb 12.8 oz (88.4 kg)   Body mass index is 27.95 kg/m.  Wt Readings from Last 3 Encounters:  03/18/18 194 lb 12.8 oz (88.4 kg)  02/28/17 199 lb (90.3 kg)  05/17/16 194 lb (88 kg)     Visual Acuity Screening   Right eye Left eye Both eyes  Without correction: 20/200 20/50 20/30  With correction:         Physical Exam Constitutional: He appears well-developed and well-nourished. No distress.  HENT:  Head: Normocephalic and atraumatic.  Right Ear: External ear normal.  Left Ear: External ear normal.  Mouth/Throat: Oropharynx is clear and moist.  Normal ear canals and TM b/l  Eyes: Conjunctivae and EOM are normal.  Neck: Neck supple. No tracheal deviation present. No thyromegaly present.  No carotid bruit  Cardiovascular: Normal rate, regular rhythm, normal heart sounds and intact distal pulses.   No murmur heard. Pulmonary/Chest: Effort normal and breath sounds normal. No respiratory distress. He has no wheezes. He has no rales.  Abdominal: Soft. He exhibits no distension. There is no tenderness.  Genitourinary: deferred by patient - will just do psa Musculoskeletal: He exhibits no edema.  Lymphadenopathy:   He has no cervical adenopathy.  Skin: Skin is warm and dry. He is not diaphoretic.  Psychiatric: He has a normal mood and affect. His behavior is normal.         Assessment & Plan:   Wellness Exam: Immunizations  prevnar today, tdap discussed, shingrix discussed Colonoscopy    Up to date  Eye exams   Up to date  Hearing loss  None  Memory concerns/difficulties   none Independent of ADLs   Fully independent Stressed the importance of regular exercise EKG  today:  Sinus bradycardia at 43 bpm, 1 PAC, RSR, no change compared to prior EKG on file   Patient received copy of preventative screening tests/immunizations recommended for the next 5-10 years.   Physical exam: Screening blood work  ordered Immunizations  prevnar today, tdap discussed, shingrix discussed Colonoscopy    Up to date  Eye exams   Up to date  Exercise  Walks a lot, does a lot of yard work - Teacher, adult education - [place] Weight   BMI good for age Skin   Sees derm annually Substance abuse   none  See Problem List for Assessment and Plan of chronic medical problems.    FU in one year

## 2018-03-17 NOTE — Patient Instructions (Addendum)
Tests ordered today. Your results will be released to Donald Wilkins (or called to you) after review, usually within 72hours after test completion. If any changes need to be made, you will be notified at that same time.  All other Health Maintenance issues reviewed.   All recommended immunizations and age-appropriate screenings are up-to-date or discussed.  Prevnar pneumonia administered today.   Medications reviewed and updated.  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  Your prescription(s) have been submitted to your pharmacy. Please take as directed and contact our office if you believe you are having problem(s) with the medication(s).   Please followup in one year for a physical    Health Maintenance, Male A healthy lifestyle and preventive care is important for your health and wellness. Ask your health care provider about what schedule of regular examinations is right for you. What should I know about weight and diet? Eat a Healthy Diet  Eat plenty of vegetables, fruits, whole grains, low-fat dairy products, and lean protein.  Do not eat a lot of foods high in solid fats, added sugars, or salt.  Maintain a Healthy Weight Regular exercise can help you achieve or maintain a healthy weight. You should:  Do at least 150 minutes of exercise each week. The exercise should increase your heart rate and make you sweat (moderate-intensity exercise).  Do strength-training exercises at least twice a week. Watch Your Levels of Cholesterol and Blood Lipids  Have your blood tested for lipids and cholesterol every 5 years starting at 66 years of age. If you are at high risk for heart disease, you should start having your blood tested when you are 66 years old. You may need to have your cholesterol levels checked more often if: ? Your lipid or cholesterol levels are high. ? You are older than 66 years of age. ? You are at high risk for heart disease. What should I know  about cancer screening? Many types of cancers can be detected early and may often be prevented. Lung Cancer  You should be screened every year for lung cancer if: ? You are a current smoker who has smoked for at least 30 years. ? You are a former smoker who has quit within the past 15 years.  Talk to your health care provider about your screening options, when you should start screening, and how often you should be screened. Colorectal Cancer  Routine colorectal cancer screening usually begins at 66 years of age and should be repeated every 5-10 years until you are 66 years old. You may need to be screened more often if early forms of precancerous polyps or small growths are found. Your health care provider may recommend screening at an earlier age if you have risk factors for colon cancer.  Your health care provider may recommend using home test kits to check for hidden blood in the stool.  A small camera at the end of a tube can be used to examine your colon (sigmoidoscopy or colonoscopy). This checks for the earliest forms of colorectal cancer. Prostate and Testicular Cancer  Depending on your age and overall health, your health care provider may do certain tests to screen for prostate and testicular cancer.  Talk to your health care provider about any symptoms or concerns you have about testicular or prostate cancer. Skin Cancer  Check your skin from head to toe regularly.  Tell your health care provider about any new moles or changes in moles, especially if: ?  There is a change in a mole's size, shape, or color. ? You have a mole that is larger than a pencil eraser.  Always use sunscreen. Apply sunscreen liberally and repeat throughout the day.  Protect yourself by wearing long sleeves, pants, a wide-brimmed hat, and sunglasses when outside. What should I know about heart disease, diabetes, and high blood pressure?  If you are 5-64 years of age, have your blood pressure  checked every 3-5 years. If you are 3 years of age or older, have your blood pressure checked every year. You should have your blood pressure measured twice-once when you are at a hospital or clinic, and once when you are not at a hospital or clinic. Record the average of the two measurements. To check your blood pressure when you are not at a hospital or clinic, you can use: ? An automated blood pressure machine at a pharmacy. ? A home blood pressure monitor.  Talk to your health care provider about your target blood pressure.  If you are between 8-47 years old, ask your health care provider if you should take aspirin to prevent heart disease.  Have regular diabetes screenings by checking your fasting blood sugar level. ? If you are at a normal weight and have a low risk for diabetes, have this test once every three years after the age of 42. ? If you are overweight and have a high risk for diabetes, consider being tested at a younger age or more often.  A one-time screening for abdominal aortic aneurysm (AAA) by ultrasound is recommended for men aged 25-75 years who are current or former smokers. What should I know about preventing infection? Hepatitis B If you have a higher risk for hepatitis B, you should be screened for this virus. Talk with your health care provider to find out if you are at risk for hepatitis B infection. Hepatitis C Blood testing is recommended for:  Everyone born from [birth year] through 1963.  Anyone with known risk factors for hepatitis C. Sexually Transmitted Diseases (STDs)  You should be screened each year for STDs including gonorrhea and chlamydia if: ? You are sexually active and are younger than 66 years of age. ? You are older than 66 years of age and your health care provider tells you that you are at risk for this type of infection. ? Your sexual activity has changed since you were last screened and you are at an increased risk for chlamydia or gonorrhea.  Ask your health care provider if you are at risk.  Talk with your health care provider about whether you are at high risk of being infected with HIV. Your health care provider may recommend a prescription medicine to help prevent HIV infection. What else can I do?  Schedule regular health, dental, and eye exams.  Stay current with your vaccines (immunizations).  Do not use any tobacco products, such as cigarettes, chewing tobacco, and e-cigarettes. If you need help quitting, ask your health care provider.  Limit alcohol intake to no more than 2 drinks per day. One drink equals 12 ounces of beer, 5 ounces of wine, or 1 ounces of hard liquor.  Do not use street drugs.  Do not share needles.  Ask your health care provider for help if you need support or information about quitting drugs.  Tell your health care provider if you often feel depressed.  Tell your health care provider if you have ever been abused or do not feel safe at home.  This information is not intended to replace advice given to you by your health care provider. Make sure you discuss any questions you have with your health care provider. Document Released: 08/17/2007 Document Revised: 10/18/2015 Document Reviewed: 11/22/2014 Elsevier Interactive Patient Education  2019 [employer].

## 2018-03-18 ENCOUNTER — Ambulatory Visit (INDEPENDENT_AMBULATORY_CARE_PROVIDER_SITE_OTHER): Payer: Medicare HMO | Admitting: Internal Medicine

## 2018-03-18 ENCOUNTER — Encounter: Payer: Self-pay | Admitting: Internal Medicine

## 2018-03-18 ENCOUNTER — Other Ambulatory Visit (INDEPENDENT_AMBULATORY_CARE_PROVIDER_SITE_OTHER): Payer: Medicare HMO

## 2018-03-18 VITALS — BP 122/78 | HR 54 | Temp 98.0°F | Resp 16 | Ht 70.0 in | Wt 194.8 lb

## 2018-03-18 DIAGNOSIS — Z0000 Encounter for general adult medical examination without abnormal findings: Secondary | ICD-10-CM

## 2018-03-18 DIAGNOSIS — Z125 Encounter for screening for malignant neoplasm of prostate: Secondary | ICD-10-CM | POA: Diagnosis not present

## 2018-03-18 DIAGNOSIS — F329 Major depressive disorder, single episode, unspecified: Secondary | ICD-10-CM

## 2018-03-18 DIAGNOSIS — Z23 Encounter for immunization: Secondary | ICD-10-CM | POA: Diagnosis not present

## 2018-03-18 DIAGNOSIS — F32A Depression, unspecified: Secondary | ICD-10-CM

## 2018-03-18 DIAGNOSIS — Z85828 Personal history of other malignant neoplasm of skin: Secondary | ICD-10-CM

## 2018-03-18 DIAGNOSIS — L649 Androgenic alopecia, unspecified: Secondary | ICD-10-CM

## 2018-03-18 DIAGNOSIS — R69 Illness, unspecified: Secondary | ICD-10-CM | POA: Diagnosis not present

## 2018-03-18 LAB — LIPID PANEL
Cholesterol: 171 mg/dL (ref 0–200)
HDL: 52.5 mg/dL (ref >39.00)
LDL Cholesterol: 107 mg/dL — ABNORMAL HIGH (ref 0–99)
NonHDL: 118.32
Total CHOL/HDL Ratio: 3
Triglycerides: 55 mg/dL (ref 0.0–149.0)
VLDL: 11 mg/dL (ref 0.0–40.0)

## 2018-03-18 LAB — COMPREHENSIVE METABOLIC PANEL
ALT: 14 U/L (ref 0–53)
AST: 16 U/L (ref 0–37)
Albumin: 3.9 g/dL (ref 3.5–5.2)
Alkaline Phosphatase: 51 U/L (ref 39–117)
BUN: 11 mg/dL (ref 6–23)
CALCIUM: 9 mg/dL (ref 8.4–10.5)
CO2: 27 mEq/L (ref 19–32)
Chloride: 104 mEq/L (ref 96–112)
Creatinine, Ser: 0.85 mg/dL (ref 0.40–1.50)
GFR: 95.88 mL/min (ref >60.00)
Glucose, Bld: 86 mg/dL (ref 70–99)
Potassium: 4.1 mEq/L (ref 3.5–5.1)
Sodium: 138 mEq/L (ref 135–145)
Total Bilirubin: 0.6 mg/dL (ref 0.2–1.2)
Total Protein: 6.2 g/dL (ref 6.0–8.3)

## 2018-03-18 LAB — CBC WITH DIFFERENTIAL/PLATELET
BASOS PCT: 0.7 % (ref 0.0–3.0)
Basophils Absolute: 0 10*3/uL (ref 0.0–0.1)
Eosinophils Absolute: 0.1 10*3/uL (ref 0.0–0.7)
Eosinophils Relative: 2.1 % (ref 0.0–5.0)
HCT: 38.7 % — ABNORMAL LOW (ref 39.0–52.0)
Hemoglobin: 13.4 g/dL (ref 13.0–17.0)
Lymphocytes Relative: 24.8 % (ref 12.0–46.0)
Lymphs Abs: 1.2 10*3/uL (ref 0.7–4.0)
MCHC: 34.7 g/dL (ref 30.0–36.0)
MCV: 93.3 fl (ref 78.0–100.0)
Monocytes Absolute: 0.6 10*3/uL (ref 0.1–1.0)
Monocytes Relative: 11.9 % (ref 3.0–12.0)
Neutro Abs: 3 10*3/uL (ref 1.4–7.7)
Neutrophils Relative %: 60.5 % (ref 43.0–77.0)
Platelets: 238 10*3/uL (ref 150.0–400.0)
RBC: 4.15 Mil/uL — ABNORMAL LOW (ref 4.22–5.81)
RDW: 12.9 % (ref 11.5–15.5)
WBC: 4.9 10*3/uL (ref 4.0–10.5)

## 2018-03-18 LAB — PSA, MEDICARE: PSA: 0.69 ng/ml (ref 0.10–4.00)

## 2018-03-18 LAB — TSH: TSH: 1.83 u[IU]/mL (ref 0.35–4.50)

## 2018-03-18 MED ORDER — DICLOFENAC SODIUM 1 % TD GEL: 4.0000 g | Freq: Four times a day (QID) | TRANSDERMAL | 5 refills | Status: DC

## 2018-03-18 MED ORDER — FINASTERIDE 5 MG PO TABS: ORAL_TABLET | ORAL | 3 refills | Status: DC

## 2018-03-18 MED ORDER — FLUOXETINE HCL 10 MG PO CAPS: ORAL_CAPSULE | ORAL | 3 refills | Status: DC

## 2018-03-18 NOTE — Assessment & Plan Note (Signed)
Controlled, stable Continue current dose of medication  

## 2018-03-18 NOTE — Assessment & Plan Note (Signed)
Taking proscar  Will continue - refilled

## 2018-03-18 NOTE — Assessment & Plan Note (Signed)
Sees derm annually - up to date with exams

## 2018-03-19 ENCOUNTER — Encounter: Payer: Self-pay | Admitting: Internal Medicine

## 2018-03-27 ENCOUNTER — Telehealth: Payer: Self-pay

## 2018-03-27 NOTE — Telephone Encounter (Signed)
PA started on CoverMyMeds KEY: [id number]

## 2018-03-31 DIAGNOSIS — L570 Actinic keratosis: Secondary | ICD-10-CM | POA: Diagnosis not present

## 2018-03-31 DIAGNOSIS — L821 Other seborrheic keratosis: Secondary | ICD-10-CM | POA: Diagnosis not present

## 2018-03-31 DIAGNOSIS — Z85828 Personal history of other malignant neoplasm of skin: Secondary | ICD-10-CM | POA: Diagnosis not present

## 2018-04-06 ENCOUNTER — Other Ambulatory Visit: Payer: Self-pay | Admitting: Internal Medicine

## 2018-04-06 MED ORDER — DICLOFENAC SODIUM 1 % TD GEL: 4.0000 g | Freq: Four times a day (QID) | TRANSDERMAL | 5 refills | Status: DC

## 2018-04-06 NOTE — Telephone Encounter (Signed)
Copied from [place] [id number]. Topic: Quick Communication - See Telephone Encounter >> Apr 06, 2018 12:06 PM Bea Graff, NT wrote: CRM for notification. See Telephone encounter for: 04/06/18. Pt states that [facility] does not have the diclofenac sodium (VOLTAREN) 1 % GEL in stock and he would like to see if this can be sent in to [facility], [address] [phone] (Phone) [phone] (Fax)

## 2018-04-08 NOTE — Telephone Encounter (Signed)
Coverage Starts on: 03/02/2018, Coverage Ends on: 03/04/2019

## 2018-07-20 ENCOUNTER — Ambulatory Visit (INDEPENDENT_AMBULATORY_CARE_PROVIDER_SITE_OTHER)
Admission: RE | Admit: 2018-07-20 | Discharge: 2018-07-20 | Disposition: A | Discharge status: Home / Self Care | Payer: Medicare HMO | Source: Ambulatory Visit | Attending: Internal Medicine | Admitting: Internal Medicine

## 2018-07-20 ENCOUNTER — Ambulatory Visit: Payer: Self-pay | Admitting: *Deleted

## 2018-07-20 ENCOUNTER — Other Ambulatory Visit (INDEPENDENT_AMBULATORY_CARE_PROVIDER_SITE_OTHER): Payer: Medicare HMO

## 2018-07-20 ENCOUNTER — Other Ambulatory Visit: Payer: Self-pay

## 2018-07-20 ENCOUNTER — Encounter: Payer: Self-pay | Admitting: Internal Medicine

## 2018-07-20 ENCOUNTER — Ambulatory Visit (INDEPENDENT_AMBULATORY_CARE_PROVIDER_SITE_OTHER): Payer: Medicare HMO | Admitting: Internal Medicine

## 2018-07-20 ENCOUNTER — Telehealth: Payer: Self-pay | Admitting: *Deleted

## 2018-07-20 ENCOUNTER — Other Ambulatory Visit: Payer: Self-pay | Admitting: Internal Medicine

## 2018-07-20 DIAGNOSIS — K529 Noninfective gastroenteritis and colitis, unspecified: Secondary | ICD-10-CM | POA: Insufficient documentation

## 2018-07-20 DIAGNOSIS — R1031 Right lower quadrant pain: Secondary | ICD-10-CM | POA: Diagnosis not present

## 2018-07-20 LAB — COMPREHENSIVE METABOLIC PANEL
ALT: 18 U/L (ref 0–53)
AST: 19 U/L (ref 0–37)
Albumin: 4.2 g/dL (ref 3.5–5.2)
Alkaline Phosphatase: 69 U/L (ref 39–117)
BUN: 13 mg/dL (ref 6–23)
CO2: 27 mEq/L (ref 19–32)
Calcium: 9 mg/dL (ref 8.4–10.5)
Chloride: 102 mEq/L (ref 96–112)
Creatinine, Ser: 0.93 mg/dL (ref 0.40–1.50)
GFR: 81.23 mL/min (ref >60.00)
Glucose, Bld: 96 mg/dL (ref 70–99)
Potassium: 3.8 mEq/L (ref 3.5–5.1)
Sodium: 138 mEq/L (ref 135–145)
Total Bilirubin: 0.7 mg/dL (ref 0.2–1.2)
Total Protein: 6.9 g/dL (ref 6.0–8.3)

## 2018-07-20 LAB — CBC WITH DIFFERENTIAL/PLATELET
Basophils Absolute: 0 10*3/uL (ref 0.0–0.1)
Basophils Relative: 0.5 % (ref 0.0–3.0)
Eosinophils Absolute: 0.1 10*3/uL (ref 0.0–0.7)
Eosinophils Relative: 1.2 % (ref 0.0–5.0)
HCT: 42 % (ref 39.0–52.0)
Hemoglobin: 14.5 g/dL (ref 13.0–17.0)
Lymphocytes Relative: 18 % (ref 12.0–46.0)
Lymphs Abs: 1.2 10*3/uL (ref 0.7–4.0)
MCHC: 34.4 g/dL (ref 30.0–36.0)
MCV: 93.5 fl (ref 78.0–100.0)
Monocytes Absolute: 0.7 10*3/uL (ref 0.1–1.0)
Monocytes Relative: 10.3 % (ref 3.0–12.0)
Neutro Abs: 4.7 10*3/uL (ref 1.4–7.7)
Neutrophils Relative %: 70 % (ref 43.0–77.0)
Platelets: 221 10*3/uL (ref 150.0–400.0)
RBC: 4.5 Mil/uL (ref 4.22–5.81)
RDW: 13.2 % (ref 11.5–15.5)
WBC: 6.6 10*3/uL (ref 4.0–10.5)

## 2018-07-20 MED ORDER — CIPROFLOXACIN HCL 500 MG PO TABS: 500.0000 mg | ORAL_TABLET | Freq: Two times a day (BID) | ORAL | 0 refills | Status: AC

## 2018-07-20 MED ORDER — IOHEXOL 240 MG/ML SOLN
100.0000 mL | Freq: Once | INTRAMUSCULAR | Status: AC | PRN
  Administered 2018-07-20: 100 mL via INTRAVENOUS

## 2018-07-20 MED ORDER — METRONIDAZOLE 500 MG PO TABS: 500.0000 mg | ORAL_TABLET | Freq: Three times a day (TID) | ORAL | 0 refills | Status: AC

## 2018-07-20 NOTE — Telephone Encounter (Signed)
DOXY appointment has been made for today at [facility].

## 2018-07-20 NOTE — Telephone Encounter (Signed)
Pt called with complaints of abdominal pain that goes from his belly button to his right side; he states that it started on 07/15/2018 but worsened on 07/17/18  after eating a spicy meal; the pt says that the pain has continued, and is worse at night; his abdomen is swollen and tender; he denies fever, n/v, and has been having normal BM's; he took Tums but is not sure if it helped; recommendations made per nurse triage protocol; he verbalized understanding; pt transferred to [place] for appointment scheduling.  Reason for Disposition . Age > 60 years  Answer Assessment - Initial Assessment Questions 1. LOCATION: "Where does it hurt?"      Belly button to lower right side 2. RADIATION: "Does the pain shoot anywhere else?" (e.g., chest, back)   no 3. ONSET: "When did the pain begin?" (Minutes, hours or days ago)     07/15/2018 4. SUDDEN: "Gradual or sudden onset?"     gradual 5. PATTERN "Does the pain come and go, or is it constant?"    - If constant: "Is it getting better, staying the same, or worsening?"      (Note: Constant means the pain never goes away completely; most serious pain is constant and it progresses)     - If intermittent: "How long does it last?" "Do you have pain now?"     (Note: Intermittent means the pain goes away completely between bouts)     Intermittent; worse at night 6. SEVERITY: "How bad is the pain?"  (e.g., Scale 1-10; mild, moderate, or severe)    - MILD (1-3): doesn't interfere with normal activities, abdomen soft and not tender to touch     - MODERATE (4-7): interferes with normal activities or awakens from sleep, tender to touch     - SEVERE (8-10): excruciating pain, doubled over, unable to do any normal activities     5 out of 10 7. RECURRENT SYMPTOM: "Have you ever had this type of abdominal pain before?" If so, ask: "When was the last time?" and "What happened that time?"      no 8. CAUSE: "What do you think is causing the abdominal pain?"     Ulcer,  appendix, gall bladder 9. RELIEVING/AGGRAVATING FACTORS: "What makes it better or worse?" (e.g., movement, antacids, bowel movement)     Eating spicy food makes it worse; been eating bland food since 07/17/2018 10. OTHER SYMPTOMS: "Has there been any vomiting, diarrhea, constipation, or urine problems?"     no  Protocols used: ABDOMINAL PAIN - MALE-A-AH

## 2018-07-20 NOTE — Assessment & Plan Note (Signed)
Pain from umbilicus to RLQ Pain at 5/10 and not improving Virtual visit so unable to examine ? Appendicitis, atypical GB disease or atypical GERD/ulcer Normal colonoscopy 2015 so unlikely diverticulitis Cbc, cmp Ct abd and pelvis

## 2018-07-20 NOTE — Telephone Encounter (Signed)

## 2018-07-20 NOTE — Progress Notes (Signed)
Virtual Visit via Video Note  I connected with Donald Wilkins on 07/20/18 at  9:00 AM EDT by a video enabled telemedicine application and verified that I am speaking with the correct person using two identifiers.   I discussed the limitations of evaluation and management by telemedicine and the availability of in person appointments. The patient expressed understanding and agreed to proceed.  The patient is currently at home and I am in the office.    No referring provider.    History of Present Illness: This is an acute visit for right sided abdominal pain.  It started last Wednesday night after eating spicy ribs - he had indigestion after he went to bed.  Every night he has had some pain.  Friday night it got worse.    He wakes up at 2 am and it hurts for a couple of hrs and then he can go back to sleep.  It hurts intermittently during the day.  It is a 5/10 pain.  Starts in umbilicus and goes down to RLQ. The pain has not been getting better.    He denies fever, nausea, GERD, diarrhea and blood in the stool.  He has had a little constipation.   The past two days bland diet.  Has not taken anything.  Eating does not seem to make it worse.  He did try doing yard work and the pain was too much for him to continue.   He had a normal colonoscopy in 2015.     Review of Systems  Constitutional: Negative for chills and fever.  Gastrointestinal: Positive for abdominal pain and constipation (mild). Negative for blood in stool, diarrhea, heartburn and nausea.  Genitourinary: Negative for dysuria, frequency and hematuria.      Social History   Socioeconomic History  . Marital status: Married    Spouse name: Not on file  . Number of children: Not on file  . Years of education: Not on file  . Highest education level: Not on file  Occupational History  . Not on file  Social Needs  . Financial resource strain: Not on file  . Food insecurity:    Worry: Not on file    Inability: Not on  file  . Transportation needs:    Medical: Not on file    Non-medical: Not on file  Tobacco Use  . Smoking status: Former Smoker    Types: Cigarettes    Last attempt to quit: 03/05/1971    Years since quitting: 47.4  . Smokeless tobacco: Never Used  . Tobacco comment: smoked  1970-1973, up to 1/2 ppd  Substance and Sexual Activity  . Alcohol use: Yes    Comment: socially; 2-3 / week  . Drug use: No  . Sexual activity: Not on file  Lifestyle  . Physical activity:    Days per week: Not on file    Minutes per session: Not on file  . Stress: Not on file  Relationships  . Social connections:    Talks on phone: Not on file    Gets together: Not on file    Attends religious service: Not on file    Active member of club or organization: Not on file    Attends meetings of clubs or organizations: Not on file    Relationship status: Not on file  Other Topics Concern  . Not on file  Social History Narrative   Exercise: walks, chops wood     Observations/Objective: Appears well in NAD  Assessment and Plan:  See Problem List for Assessment and Plan of chronic medical problems.   Follow Up Instructions:    I discussed the assessment and treatment plan with the patient. The patient was provided an opportunity to ask questions and all were answered. The patient agreed with the plan and demonstrated an understanding of the instructions.   The patient was advised to call back or seek an in-person evaluation if the symptoms worsen or if the condition fails to improve as anticipated.    Binnie Rail, MD

## 2018-08-24 DIAGNOSIS — Z03818 Encounter for observation for suspected exposure to other biological agents ruled out: Secondary | ICD-10-CM | POA: Diagnosis not present

## 2018-10-12 ENCOUNTER — Encounter: Payer: Self-pay | Admitting: Internal Medicine

## 2018-10-12 ENCOUNTER — Ambulatory Visit (INDEPENDENT_AMBULATORY_CARE_PROVIDER_SITE_OTHER): Payer: Medicare HMO | Admitting: Internal Medicine

## 2018-10-12 ENCOUNTER — Other Ambulatory Visit: Payer: Self-pay

## 2018-10-12 DIAGNOSIS — R21 Rash and other nonspecific skin eruption: Secondary | ICD-10-CM | POA: Insufficient documentation

## 2018-10-12 DIAGNOSIS — J029 Acute pharyngitis, unspecified: Secondary | ICD-10-CM | POA: Diagnosis not present

## 2018-10-12 DIAGNOSIS — Z20822 Contact with and (suspected) exposure to covid-19: Secondary | ICD-10-CM

## 2018-10-12 NOTE — Progress Notes (Signed)
Virtual Visit via Video Note  I connected with Donald Wilkins on 10/12/18 at  1:00 PM EDT by a video enabled telemedicine application and verified that I am speaking with the correct person using two identifiers.   I discussed the limitations of evaluation and management by telemedicine and the availability of in person appointments. The patient expressed understanding and agreed to proceed.  The patient is currently at home and I am in the office.    No referring provider.    History of Present Illness: This is an acute visit for rash.   Rash on legs, ? COVID related:  Last Friday afternoon he developed itching in various places on his left leg.  He later broke out in red bumps on both legs, genital area and torso.  The rash is very itchy.  He was concerned this could be possible symptom of COVID.  He denies any fever-he checked his temperature today and it was 97.3.  He has had a sore throat a couple of mornings, but it goes away after he drinks coffee and he did not think it was anything major.  He denies any other concerning symptoms.  Overall he thinks he has been careful about wearing a mask when he goes to the store.  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  He denies other possible causes of the rash including change in products.  He did mow his lawn the morning that his symptoms started, but was on [employer] merchand[employer].  He has taken benadryl and used cortisone cream for the rash, which has helped.      Review of Systems  Constitutional: Negative for fever and malaise/fatigue.  HENT: Positive for sore throat (first thing in the morning). Negative for congestion, ear pain and sinus pain.   Respiratory: Negative for cough, shortness of breath and wheezing.   Gastrointestinal: Negative for diarrhea and nausea.  Musculoskeletal: Negative for myalgias.  Skin: Positive for rash.  Neurological: Negative for headaches.      Social History    Socioeconomic History  . Marital status: Married    Spouse name: Not on file  . Number of children: Not on file  . Years of education: Not on file  . Highest education level: Not on file  Occupational History  . Not on file  Social Needs  . Financial resource strain: Not on file  . Food insecurity    Worry: Not on file    Inability: Not on file  . Transportation needs    Medical: Not on file    Non-medical: Not on file  Tobacco Use  . Smoking status: Former Smoker    Types: Cigarettes    Quit date: 03/05/1971    Years since quitting: 47.6  . Smokeless tobacco: Never Used  . Tobacco comment: smoked  1970-1973, up to 1/2 ppd  Substance and Sexual Activity  . Alcohol use: Yes    Comment: socially; 2-3 / week  . Drug use: No  . Sexual activity: Not on file  Lifestyle  . Physical activity    Days per week: Not on file    Minutes per session: Not on file  . Stress: Not on file  Relationships  . Social Herbalist on phone: Not on file    Gets together: Not on file    Attends religious service: Not on file    Active member of club or organization:  Not on file    Attends meetings of clubs or organizations: Not on file    Relationship status: Not on file  Other Topics Concern  . Not on file  Social History Narrative   Exercise: walks, chops wood     Observations/Objective: Appears well in NAD Breathing normally Legs: Macular papular rash that is raised  Assessment and Plan:  See Problem List for Assessment and Plan of chronic medical problems.   Follow Up Instructions:    I discussed the assessment and treatment plan with the patient. The patient was provided an opportunity to ask questions and all were answered. The patient agreed with the plan and demonstrated an understanding of the instructions.   The patient was advised to call back or seek an in-person evaluation if the symptoms worsen or if the condition fails to improve as  anticipated.    Binnie Rail, MD

## 2018-10-12 NOTE — Assessment & Plan Note (Addendum)
Maculopapular rash on legs, genital region and torso Does not look petechial in nature Itchy No obvious cause-?  Plant dermatitis, allergic dermatitis versus possibility of COVID Will have him get tested for COVID Continue Benadryl and cortisone cream as needed Depending on results of COVID can determine other treatment

## 2018-10-12 NOTE — Assessment & Plan Note (Signed)
Mild sore throat a couple of mornings, but no other concerning symptoms for COVID besides rash, which may have another cause Symptomatic treatment Will test for COVID

## 2018-10-13 ENCOUNTER — Encounter: Payer: Self-pay | Admitting: Internal Medicine

## 2018-10-13 LAB — NOVEL CORONAVIRUS, NAA: SARS-CoV-2, NAA: NOT DETECTED

## 2018-11-04 DIAGNOSIS — L738 Other specified follicular disorders: Secondary | ICD-10-CM | POA: Diagnosis not present

## 2018-11-04 DIAGNOSIS — L720 Epidermal cyst: Secondary | ICD-10-CM | POA: Diagnosis not present

## 2018-11-04 DIAGNOSIS — L821 Other seborrheic keratosis: Secondary | ICD-10-CM | POA: Diagnosis not present

## 2018-11-04 DIAGNOSIS — Z85828 Personal history of other malignant neoplasm of skin: Secondary | ICD-10-CM | POA: Diagnosis not present

## 2018-11-04 DIAGNOSIS — D2271 Melanocytic nevi of right lower limb, including hip: Secondary | ICD-10-CM | POA: Diagnosis not present

## 2018-11-04 DIAGNOSIS — L239 Allergic contact dermatitis, unspecified cause: Secondary | ICD-10-CM | POA: Diagnosis not present

## 2018-11-04 DIAGNOSIS — D1801 Hemangioma of skin and subcutaneous tissue: Secondary | ICD-10-CM | POA: Diagnosis not present

## 2018-11-04 DIAGNOSIS — L820 Inflamed seborrheic keratosis: Secondary | ICD-10-CM | POA: Diagnosis not present

## 2018-11-05 DIAGNOSIS — R69 Illness, unspecified: Secondary | ICD-10-CM | POA: Diagnosis not present

## 2018-12-02 ENCOUNTER — Ambulatory Visit (INDEPENDENT_AMBULATORY_CARE_PROVIDER_SITE_OTHER): Payer: Medicare HMO

## 2018-12-02 DIAGNOSIS — Z23 Encounter for immunization: Secondary | ICD-10-CM

## 2018-12-02 DIAGNOSIS — Z299 Encounter for prophylactic measures, unspecified: Secondary | ICD-10-CM

## 2018-12-02 DIAGNOSIS — R69 Illness, unspecified: Secondary | ICD-10-CM | POA: Diagnosis not present

## 2019-01-01 ENCOUNTER — Other Ambulatory Visit: Payer: Self-pay

## 2019-01-01 MED ORDER — FINASTERIDE 5 MG PO TABS: ORAL_TABLET | ORAL | 1 refills | Status: DC

## 2019-02-01 ENCOUNTER — Other Ambulatory Visit: Payer: Self-pay

## 2019-02-01 DIAGNOSIS — Z20822 Contact with and (suspected) exposure to covid-19: Secondary | ICD-10-CM

## 2019-02-02 LAB — NOVEL CORONAVIRUS, NAA: SARS-CoV-2, NAA: NOT DETECTED

## 2019-03-20 NOTE — Progress Notes (Signed)
Subjective:    Patient ID: Donald Wilkins, male    DOB: 1950-06-19, 69 y.o.   MRN: [id number]  HPI He is here for a physical exam.   He denies changes in his history.  He is interested in trying viagra. He has never used it before.   He has no compliants.    Medications and allergies reviewed with patient and updated if appropriate.  Patient Active Problem List   Diagnosis Date Noted  . Rash 10/12/2018  . Right lower quadrant abdominal pain 07/20/2018  . Enteritis 07/20/2018  . Depression 02/27/2016  . 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 Pain in joint, lower leg 02/22/2013  . Foot pain, right 02/06/2012  . 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 History of skin cancer 08/07/2009  . Palpitations 09/18/2007  . MITRAL VALVE PROLAPSE, HX OF 05/28/2007    Current Outpatient Medications on File Prior to Visit  Medication Sig Dispense Refill  . aspirin 81 MG tablet Take 81 mg by mouth daily.      . diclofenac sodium (VOLTAREN) 1 % GEL Apply 4 g topically 4 (four) times daily. 100 g 5  . finasteride (PROSCAR) 5 MG tablet TAKE 1/2 TABLET BY MOUTH EVERY 2 DAYS 90 tablet 1  . FLUoxetine (PROZAC) 10 MG capsule TAKE ONE CAPSULE BY MOUTH ONCE DAILY 90 capsule 3   No current facility-administered medications on file prior to visit.    Past Medical History:  Diagnosis Date  . Basal cell cancer    X 2;[facility]  . Hyperlipidemia   . Incomplete right bundle branch block   . MVP (mitral valve prolapse)    on ECHO    Past Surgical History:  Procedure Laterality Date  . ANAL FISSURE REPAIR    . BASAL CELL CARCINOMA EXCISION  2011 & 2012   Mohs L face; back  . colonoscopy with polypectomy  2005    GI, Dr Deatra Ina; negative 2010    Social History   Socioeconomic History  . Marital status: Married    Spouse name: Not on file  . Number of children: Not on file  . Years of education: Not on file  . Highest education level: Not on file    Occupational History  . Not on file  Tobacco Use  . Smoking status: Former Smoker    Types: Cigarettes    Quit date: 03/05/1971    Years since quitting: 48.0  . Smokeless tobacco: Never Used  . Tobacco comment: smoked  1970-1973, up to 1/2 ppd  Substance and Sexual Activity  . Alcohol use: Yes    Comment: socially; 2-3 / week  . Drug use: No  . Sexual activity: Not on file  Other Topics Concern  . Not on file  Social History Narrative   Exercise: walks, chops wood   Social Determinants of Health   Financial Resource Strain:   . Difficulty of Paying Living Expenses: Not on file  Food Insecurity:   . Worried About Charity fundraiser in the Last Year: Not on file  . Ran Out of Food in the Last Year: Not on file  Transportation Needs:   . Lack of Transportation (Medical): Not on file  . Lack of Transportation (Non-Medical): Not on file  Physical Activity:   . Days of Exercise per Week: Not on file  . Minutes of Exercise per Session: Not on file  Stress:   . Feeling of Stress : Not  on file  Social Connections:   . Frequency of Communication with Friends and Family: Not on file  . Frequency of Social Gatherings with Friends and Family: Not on file  . Attends Religious Services: Not on file  . Active Member of Clubs or Organizations: Not on file  . Attends Archivist Meetings: Not on file  . Marital Status: Not on file    Family History  Problem Relation Age of Onset  . Heart attack Mother 70  . Diabetes Father   . Lung cancer Father        smoker  . Heart attack Maternal Uncle        pre 55  . Cancer Brother        malignant,brain stem  . Diabetes Sister   . Hypertension Sister     Review of Systems  Constitutional: Negative for fever.  Eyes: Negative for visual disturbance.  Respiratory: Negative for cough, shortness of breath and wheezing.   Cardiovascular: Negative for chest pain, palpitations and leg swelling.  Gastrointestinal: Negative for  abdominal pain, blood in stool, constipation, diarrhea and nausea.       Occ gerd  Genitourinary: Negative for dysuria and hematuria.  Musculoskeletal: Positive for back pain (lower back - aspercream or advil). Negative for arthralgias.  Skin: Negative for color change and rash.  Neurological: Negative for dizziness, light-headedness and headaches.  Psychiatric/Behavioral: Positive for dysphoric mood. The patient is not nervous/anxious.        Objective:   Vitals:   03/22/19 0809  BP: 132/80  Pulse: 84  Resp: 16  Temp: 98.2 F (36.8 C)  SpO2: 98%   Filed Weights   03/22/19 0809  Weight: 195 lb (88.5 kg)   Body mass index is 27.98 kg/m.  BP Readings from Last 3 Encounters:  03/22/19 132/80  03/18/18 122/78  02/28/17 114/68    Wt Readings from Last 3 Encounters:  03/22/19 195 lb (88.5 kg)  03/18/18 194 lb 12.8 oz (88.4 kg)  02/28/17 199 lb (90.3 kg)     Physical Exam Constitutional: He appears well-developed and well-nourished. No distress.  HENT:  Head: Normocephalic and atraumatic.  Right Ear: External ear normal.  Left Ear: External ear normal.  Mouth/Throat: Oropharynx is clear and moist.  Normal ear canals and TM b/l  Eyes: Conjunctivae and EOM are normal.  Neck: Neck supple. No tracheal deviation present. No thyromegaly present.  No carotid bruit  Cardiovascular: Normal rate, regular rhythm, normal heart sounds and intact distal pulses.   No murmur heard. Pulmonary/Chest: Effort normal and breath sounds normal. No respiratory distress. He has no wheezes. He has no rales.  Abdominal: Soft. He exhibits no distension. There is no tenderness.  Genitourinary: normal sized prostate, no nodules Musculoskeletal: He exhibits no edema.  Lymphadenopathy:   He has no cervical adenopathy.  Skin: Skin is warm and dry. He is not diaphoretic.  Psychiatric: He has a normal mood and affect. His behavior is normal.         Assessment & Plan:   Physical  exam: Screening blood work  ordered Immunizations  PPSV23 deferred , discussed shingrix, covid Colonoscopy   Up to date  Eye exams   Up to date  Exercise   Walking, yardwork - [place] big lot Weight  Ok for age Substance abuse  None Sees derm annually   See Problem List for Assessment and Plan of chronic medical problems.   This visit occurred during the SARS-CoV-2 public health  emergency.  Safety protocols were in place, including screening questions prior to the visit, additional usage of staff PPE, and extensive cleaning of exam room while observing appropriate contact time as indicated for disinfecting solutions.

## 2019-03-20 NOTE — Patient Instructions (Addendum)
Blood work was ordered.    All other Health Maintenance issues reviewed.   All recommended immunizations and age-appropriate screenings are up-to-date or discussed.  No immunization administered today.   Medications reviewed and updated.  Changes include :   viagra as needed   Your prescription(s) have been submitted to your pharmacy. Please take as directed and contact our office if you believe you are having problem(s) with the medication(s).    Please followup in 1 year    Health Maintenance, Male Adopting a healthy lifestyle and getting preventive care are important in promoting health and wellness. Ask your health care provider about:  The right schedule for you to have regular tests and exams.  Things you can do on your own to prevent diseases and keep yourself healthy. What should I know about diet, weight, and exercise? Eat a healthy diet   Eat a diet that includes plenty of vegetables, fruits, low-fat dairy products, and lean protein.  Do not eat a lot of foods that are high in solid fats, added sugars, or sodium. Maintain a healthy weight Body mass index (BMI) is a measurement that can be used to identify possible weight problems. It estimates body fat based on height and weight. Your health care provider can help determine your BMI and help you achieve or maintain a healthy weight. Get regular exercise Get regular exercise. This is one of the most important things you can do for your health. Most adults should:  Exercise for at least 150 minutes each week. The exercise should increase your heart rate and make you sweat (moderate-intensity exercise).  Do strengthening exercises at least twice a week. This is in addition to the moderate-intensity exercise.  Spend less time sitting. Even light physical activity can be beneficial. Watch cholesterol and blood lipids Have your blood tested for lipids and cholesterol at 67 years of age, then have this test every 5  years. You may need to have your cholesterol levels checked more often if:  Your lipid or cholesterol levels are high.  You are older than 67 years of age.  You are at high risk for heart disease. What should I know about cancer screening? Many types of cancers can be detected early and may often be prevented. Depending on your health history and family history, you may need to have cancer screening at various ages. This may include screening for:  Colorectal cancer.  Prostate cancer.  Skin cancer.  Lung cancer. What should I know about heart disease, diabetes, and high blood pressure? Blood pressure and heart disease  High blood pressure causes heart disease and increases the risk of stroke. This is more likely to develop in people who have high blood pressure readings, are of African descent, or are overweight.  Talk with your health care provider about your target blood pressure readings.  Have your blood pressure checked: ? Every 3-5 years if you are 22-48 years of age. ? Every year if you are 44 years old or older.  If you are between the ages of 35 and 64 and are a current or former smoker, ask your health care provider if you should have a one-time screening for abdominal aortic aneurysm (AAA). Diabetes Have regular diabetes screenings. This checks your fasting blood sugar level. Have the screening done:  Once every three years after age 62 if you are at a normal weight and have a low risk for diabetes.  More often and at a younger age if you are overweight  or have a high risk for diabetes. What should I know about preventing infection? Hepatitis B If you have a higher risk for hepatitis B, you should be screened for this virus. Talk with your health care provider to find out if you are at risk for hepatitis B infection. Hepatitis C Blood testing is recommended for:  Everyone born from [birth year] through 1963.  Anyone with known risk factors for hepatitis C. Sexually  transmitted infections (STIs)  You should be screened each year for STIs, including gonorrhea and chlamydia, if: ? You are sexually active and are younger than 67 years of age. ? You are older than 67 years of age and your health care provider tells you that you are at risk for this type of infection. ? Your sexual activity has changed since you were last screened, and you are at increased risk for chlamydia or gonorrhea. Ask your health care provider if you are at risk.  Ask your health care provider about whether you are at high risk for HIV. Your health care provider may recommend a prescription medicine to help prevent HIV infection. If you choose to take medicine to prevent HIV, you should first get tested for HIV. You should then be tested every 3 months for as long as you are taking the medicine. Follow these instructions at home: Lifestyle  Do not use any products that contain nicotine or tobacco, such as cigarettes, e-cigarettes, and chewing tobacco. If you need help quitting, ask your health care provider.  Do not use street drugs.  Do not share needles.  Ask your health care provider for help if you need support or information about quitting drugs. Alcohol use  Do not drink alcohol if your health care provider tells you not to drink.  If you drink alcohol: ? Limit how much you have to 0-2 drinks a day. ? Be aware of how much alcohol is in your drink. In the [place] one drink equals one 12 oz bottle of beer (355 mL), one 5 oz glass of wine (148 mL), or one 1 oz glass of hard liquor (44 mL). General instructions  Schedule regular health, dental, and eye exams.  Stay current with your vaccines.  Tell your health care provider if: ? You often feel depressed. ? You have ever been abused or do not feel safe at home. Summary  Adopting a healthy lifestyle and getting preventive care are important in promoting health and wellness.  Follow your health care provider's  instructions about healthy diet, exercising, and getting tested or screened for diseases.  Follow your health care provider's instructions on monitoring your cholesterol and blood pressure. This information is not intended to replace advice given to you by your health care provider. Make sure you discuss any questions you have with your health care provider. Document Revised: 02/11/2018 Document Reviewed: 02/11/2018 Elsevier Patient Education  2020 [employer].

## 2019-03-22 ENCOUNTER — Ambulatory Visit (INDEPENDENT_AMBULATORY_CARE_PROVIDER_SITE_OTHER): Payer: Medicare HMO | Admitting: Internal Medicine

## 2019-03-22 ENCOUNTER — Encounter: Payer: Self-pay | Admitting: Internal Medicine

## 2019-03-22 ENCOUNTER — Other Ambulatory Visit: Payer: Self-pay

## 2019-03-22 VITALS — BP 132/80 | HR 84 | Temp 98.2°F | Resp 16 | Ht 70.0 in | Wt 195.0 lb

## 2019-03-22 DIAGNOSIS — Z0000 Encounter for general adult medical examination without abnormal findings: Secondary | ICD-10-CM

## 2019-03-22 DIAGNOSIS — F32A Depression, unspecified: Secondary | ICD-10-CM

## 2019-03-22 DIAGNOSIS — F329 Major depressive disorder, single episode, unspecified: Secondary | ICD-10-CM

## 2019-03-22 DIAGNOSIS — L649 Androgenic alopecia, unspecified: Secondary | ICD-10-CM

## 2019-03-22 DIAGNOSIS — Z125 Encounter for screening for malignant neoplasm of prostate: Secondary | ICD-10-CM

## 2019-03-22 DIAGNOSIS — R69 Illness, unspecified: Secondary | ICD-10-CM | POA: Diagnosis not present

## 2019-03-22 DIAGNOSIS — N529 Male erectile dysfunction, unspecified: Secondary | ICD-10-CM

## 2019-03-22 LAB — CBC WITH DIFFERENTIAL/PLATELET
Basophils Absolute: 0 10*3/uL (ref 0.0–0.1)
Basophils Relative: 0.6 % (ref 0.0–3.0)
Eosinophils Absolute: 0.1 10*3/uL (ref 0.0–0.7)
Eosinophils Relative: 2.4 % (ref 0.0–5.0)
HCT: 41.4 % (ref 39.0–52.0)
Hemoglobin: 14 g/dL (ref 13.0–17.0)
Lymphocytes Relative: 29.5 % (ref 12.0–46.0)
Lymphs Abs: 1.4 10*3/uL (ref 0.7–4.0)
MCHC: 33.9 g/dL (ref 30.0–36.0)
MCV: 94.7 fl (ref 78.0–100.0)
Monocytes Absolute: 0.6 10*3/uL (ref 0.1–1.0)
Monocytes Relative: 12.4 % — ABNORMAL HIGH (ref 3.0–12.0)
Neutro Abs: 2.6 10*3/uL (ref 1.4–7.7)
Neutrophils Relative %: 55.1 % (ref 43.0–77.0)
Platelets: 189 10*3/uL (ref 150.0–400.0)
RBC: 4.37 Mil/uL (ref 4.22–5.81)
RDW: 13.4 % (ref 11.5–15.5)
WBC: 4.7 10*3/uL (ref 4.0–10.5)

## 2019-03-22 LAB — COMPREHENSIVE METABOLIC PANEL
ALT: 17 U/L (ref 0–53)
AST: 19 U/L (ref 0–37)
Albumin: 4.3 g/dL (ref 3.5–5.2)
Alkaline Phosphatase: 60 U/L (ref 39–117)
BUN: 13 mg/dL (ref 6–23)
CO2: 28 mEq/L (ref 19–32)
Calcium: 9.3 mg/dL (ref 8.4–10.5)
Chloride: 104 mEq/L (ref 96–112)
Creatinine, Ser: 0.86 mg/dL (ref 0.40–1.50)
GFR: 88.73 mL/min (ref >60.00)
Glucose, Bld: 94 mg/dL (ref 70–99)
Potassium: 4.2 mEq/L (ref 3.5–5.1)
Sodium: 139 mEq/L (ref 135–145)
Total Bilirubin: 0.8 mg/dL (ref 0.2–1.2)
Total Protein: 6.4 g/dL (ref 6.0–8.3)

## 2019-03-22 LAB — TSH: TSH: 1.59 u[IU]/mL (ref 0.35–4.50)

## 2019-03-22 LAB — LIPID PANEL
Cholesterol: 177 mg/dL (ref 0–200)
HDL: 61.5 mg/dL (ref >39.00)
LDL Cholesterol: 102 mg/dL — ABNORMAL HIGH (ref 0–99)
NonHDL: 115.35
Total CHOL/HDL Ratio: 3
Triglycerides: 66 mg/dL (ref 0.0–149.0)
VLDL: 13.2 mg/dL (ref 0.0–40.0)

## 2019-03-22 LAB — PSA, MEDICARE: PSA: 0.67 ng/ml (ref 0.10–4.00)

## 2019-03-22 MED ORDER — FLUOXETINE HCL 10 MG PO CAPS: ORAL_CAPSULE | ORAL | 3 refills | Status: DC

## 2019-03-22 MED ORDER — SILDENAFIL CITRATE 20 MG PO TABS: ORAL_TABLET | ORAL | 5 refills | Status: DC

## 2019-03-22 MED ORDER — FINASTERIDE 5 MG PO TABS: ORAL_TABLET | ORAL | 3 refills | Status: DC

## 2019-03-22 NOTE — Assessment & Plan Note (Signed)
New Trial of generic viagra Discussed possible side effects - can try cialis if he has side effects

## 2019-03-22 NOTE — Assessment & Plan Note (Signed)
proscar daily - working well, no side effects Would like to continue

## 2019-03-22 NOTE — Assessment & Plan Note (Signed)
Chronic Controlled, stable Continue current dose of medication -  prozac 10 mg daily

## 2019-04-01 ENCOUNTER — Ambulatory Visit: Payer: Medicare HMO

## 2019-04-06 ENCOUNTER — Ambulatory Visit: Payer: Medicare HMO

## 2019-04-10 ENCOUNTER — Ambulatory Visit: Payer: Medicare HMO | Attending: Internal Medicine

## 2019-04-10 DIAGNOSIS — Z23 Encounter for immunization: Secondary | ICD-10-CM | POA: Insufficient documentation

## 2019-04-10 NOTE — Progress Notes (Signed)
   Covid-19 Vaccination Clinic  Name:  Donald Wilkins    MRN: [id number] DOB: [birth year]  04/10/2019  [name] was observed post Covid-19 immunization for 15 minutes without incidence. He was provided with Vaccine Information Sheet and instruction to access the V-Safe system.   Mr. Nor was instructed to call 911 with any severe reactions post vaccine: [name] Difficulty breathing  . Swelling of your face and throat  . A fast heartbeat  . A bad rash all over your body  . Dizziness and weakness    Immunizations Administered    Name Date Dose VIS Date Route   Pfizer COVID-19 Vaccine 04/10/2019 10:08 AM 0.3 mL 02/12/2019 Intramuscular   Manufacturer: [place]   Lot: [id number]   [name]: [id number]

## 2019-05-04 ENCOUNTER — Ambulatory Visit: Payer: Medicare HMO

## 2019-05-04 ENCOUNTER — Ambulatory Visit: Payer: Medicare HMO | Attending: Internal Medicine

## 2019-05-04 DIAGNOSIS — Z23 Encounter for immunization: Secondary | ICD-10-CM | POA: Insufficient documentation

## 2019-05-04 NOTE — Progress Notes (Signed)
   Covid-19 Vaccination Clinic  Name:  Donald Wilkins    MRN: [id number] DOB: [birth year]  05/04/2019  [name] was observed post Covid-19 immunization for 15 minutes without incident. He was provided with Vaccine Information Sheet and instruction to access the V-Safe system.   [name] was instructed to call 911 with any severe reactions post vaccine: [name] Difficulty breathing  . Swelling of face and throat  . A fast heartbeat  . A bad rash all over body  . Dizziness and weakness   Immunizations Administered    Name Date Dose VIS Date Route   Pfizer COVID-19 Vaccine 05/04/2019  9:13 AM 0.3 mL 02/12/2019 Intramuscular   Manufacturer: [place]   Lot: [id number]   [place]: [id number]

## 2019-06-21 ENCOUNTER — Ambulatory Visit: Payer: Medicare HMO

## 2019-06-21 DIAGNOSIS — R69 Illness, unspecified: Secondary | ICD-10-CM | POA: Diagnosis not present

## 2019-06-23 ENCOUNTER — Ambulatory Visit: Payer: Medicare HMO

## 2019-07-01 ENCOUNTER — Ambulatory Visit (INDEPENDENT_AMBULATORY_CARE_PROVIDER_SITE_OTHER): Payer: Medicare HMO | Admitting: *Deleted

## 2019-07-01 ENCOUNTER — Other Ambulatory Visit: Payer: Self-pay

## 2019-07-01 DIAGNOSIS — Z23 Encounter for immunization: Secondary | ICD-10-CM | POA: Diagnosis not present

## 2019-09-07 ENCOUNTER — Ambulatory Visit: Payer: Medicare HMO

## 2019-09-16 ENCOUNTER — Ambulatory Visit (INDEPENDENT_AMBULATORY_CARE_PROVIDER_SITE_OTHER): Payer: Medicare HMO

## 2019-09-16 DIAGNOSIS — Z0000 Encounter for general adult medical examination without abnormal findings: Secondary | ICD-10-CM | POA: Diagnosis not present

## 2019-09-16 NOTE — Progress Notes (Signed)
I connected with Donald Wilkins today by telephone and verified that I am speaking with the correct person using two identifiers. Location patient: home Location provider: work Persons participating in the virtual visit: Donald Wilkins and Donald Abu, LPN   I discussed the limitations, risks, security and privacy concerns of performing an evaluation and management service by telephone and the availability of in person appointments. I also discussed with the patient that there may be a patient responsible charge related to this service. The patient expressed understanding and verbally consented to this telephonic visit.    Interactive audio and video telecommunications were attempted between this provider and patient, however failed, due to patient having technical difficulties OR patient did not have access to video capability.  We continued and completed visit with audio only.  Some vital signs may be absent or patient reported.   Time Spent with patient on telephone encounter: 20 minutes  Subjective:   Donald Wilkins is a 69 y.o. male who presents for Medicare Annual/Subsequent preventive examination.  Review of Systems     Cardiac Risk Factors include: advanced age (>45men, >58 women);family history of premature cardiovascular disease;male gender     Objective:    There were no vitals filed for this visit. There is no height or weight on file to calculate BMI.  Advanced Directives 09/16/2019  Does Patient Have a Medical Advance Directive? No  Would patient like information on creating a medical advance directive? No - Patient declined    Current Medications (verified) Outpatient Encounter Medications as of 09/16/2019  Medication Sig  . aspirin 81 MG tablet Take 81 mg by mouth daily.    . diclofenac sodium (VOLTAREN) 1 % GEL Apply 4 g topically 4 (four) times daily.  . finasteride (PROSCAR) 5 MG tablet TAKE 1/2 TABLET BY MOUTH EVERY 2 DAYS  . FLUoxetine (PROZAC) 10 MG capsule TAKE  ONE CAPSULE BY MOUTH ONCE DAILY  . sildenafil (REVATIO) 20 MG tablet Take 2-5 pills as needed   No facility-administered encounter medications on file as of 09/16/2019.    Allergies (verified) Patient has no known allergies.   History: Past Medical History:  Diagnosis Date  . Basal cell cancer    X 2;[facility]  . Hyperlipidemia   . Incomplete right bundle branch block   . MVP (mitral valve prolapse)    on ECHO   Past Surgical History:  Procedure Laterality Date  . ANAL FISSURE REPAIR    . BASAL CELL CARCINOMA EXCISION  2011 & 2012   Mohs L face; back  . colonoscopy with polypectomy  2005   [facility], Dr Deatra Ina; negative 2010   Family History  Problem Relation Age of Onset  . Heart attack Mother 73  . Diabetes Father   . Lung cancer Father        smoker  . Heart attack Maternal Uncle        pre 55  . Cancer Brother        malignant,brain stem  . Diabetes Sister   . Hypertension Sister    Social History   Socioeconomic History  . Marital status: Married    Spouse name: Not on file  . Number of children: Not on file  . Years of education: Not on file  . Highest education level: Not on file  Occupational History  . Not on file  Tobacco Use  . Smoking status: Former Smoker    Types: Cigarettes    Quit date: 03/05/1971  Years since quitting: 48.5  . Smokeless tobacco: Never Used  . Tobacco comment: smoked  1970-1973, up to 1/2 ppd  Substance and Sexual Activity  . Alcohol use: Yes    Comment: socially; 2-3 / week  . Drug use: No  . Sexual activity: Not on file  Other Topics Concern  . Not on file  Social History Narrative   Exercise: walks, chops wood   Social Determinants of Health   Financial Resource Strain: Low Risk   . Difficulty of Paying Living Expenses: Not hard at all  Food Insecurity: No Food Insecurity  . Worried About Charity fundraiser in the Last Year: Never true  . Ran Out of Food in the Last Year: Never true  Transportation Needs: No  Transportation Needs  . Lack of Transportation (Medical): No  . Lack of Transportation (Non-Medical): No  Physical Activity: Sufficiently Active  . Days of Exercise per Week: 5 days  . Minutes of Exercise per Session: 30 min  Stress: No Stress Concern Present  . Feeling of Stress : Not at all  Social Connections: Socially Integrated  . Frequency of Communication with Friends and Family: More than three times a week  . Frequency of Social Gatherings with Friends and Family: More than three times a week  . Attends Religious Services: More than 4 times per year  . Active Member of Clubs or Organizations: Yes  . Attends Archivist Meetings: More than 4 times per year  . Marital Status: Married    Tobacco Counseling Counseling given: Not Answered Comment: smoked  1970-1973, up to 1/2 ppd   Clinical Intake:  Pre-visit preparation completed: Yes  Pain : No/denies pain     Nutritional Risks: None Diabetes: No  What is the last grade level you completed in school?: Master's degree  Diabetic? no  Interpreter Needed?: No  Information entered by :: Donald Kintzel N. Rahkim Rabalais, LPN   Activities of Daily Living In your present state of health, do you have any difficulty performing the following activities: 09/16/2019  Wilkins? N  Vision? N  Difficulty concentrating or making decisions? N  Walking or climbing stairs? N  Dressing or bathing? N  Doing errands, shopping? N  Preparing Food and eating ? N  Using the Toilet? N  In the past six months, have you accidently leaked urine? N  Do you have problems with loss of bowel control? N  Managing your Medications? N  Managing your Finances? N  Housekeeping or managing your Housekeeping? N  Some recent data might be hidden    Patient Care Team: Binnie Rail, MD as PCP - General (Internal Medicine) Rutherford Guys, MD as Consulting Physician (Ophthalmology) Sydnee Levans, MD as Referring Physician  (Dermatology)  Indicate any recent Medical Services you may have received from other than [facility] providers in the past year (date may be approximate).     Assessment:   This is a routine wellness examination for [name].  Wilkins/Vision screen No exam data present  Dietary issues and exercise activities discussed: Current Exercise Habits: Home exercise routine, Type of exercise: walking, Time (Minutes): 30 (yard work, chop wood and play drums), Frequency (Times/Week): 5, Weekly Exercise (Minutes/Week): 150, Intensity: Moderate, Exercise limited by: None identified  Goals    .  Client understands the importance of follow-up with providers by attending scheduled visits (pt-stated)      I would like to lose around 5-10 pounds.      Depression Screen PHQ 2/9 Scores 09/16/2019  03/22/2019 03/18/2018 02/28/2017 10/14/2012  PHQ - 2 Score 0 0 0 0 0  PHQ- 9 Score - 1 - 0 -    Fall Risk Fall Risk  09/16/2019 03/22/2019 03/18/2018 10/14/2012  Falls in the past year? 0 0 0 No  Number falls in past yr: 0 0 0 -  Injury with Fall? 0 - - -  Risk for fall due to : No Fall Risks - - -  Follow up Falls evaluation completed - - -    Any stairs in or around the home? Yes  If so, are there any without handrails? No  Home free of loose throw rugs in walkways, pet beds, electrical cords, etc? Yes  Adequate lighting in your home to reduce risk of falls? Yes   ASSISTIVE DEVICES UTILIZED TO PREVENT FALLS:  Life alert? No  Use of a cane, walker or w/c? No  Grab bars in the bathroom? No  Shower chair or bench in shower? No  Elevated toilet seat or a handicapped toilet? No   TIMED UP AND GO:  Was the test performed? No .  Length of time to ambulate 10 feet: 0 sec.   Gait steady and fast without use of assistive device  Cognitive Function: not indicated; patient is cogitatively intact.        Immunizations Immunization History  Administered Date(s) Administered  . Fluad Quad(high Dose 65+)  12/02/2018  . Influenza, High Dose Seasonal PF 02/05/2018  . Influenza,inj,Quad PF,6+ Mos 03/21/2015, 02/27/2016, 02/28/2017  . PFIZER SARS-COV-2 Vaccination 04/10/2019, 05/04/2019  . Pneumococcal Conjugate-13 03/18/2018  . Pneumococcal Polysaccharide-23 07/01/2019  . Td 05/28/2007  . Tdap 12/02/2018  . Zoster 03/21/2015  . Zoster Recombinat (Shingrix) 07/12/2019    TDAP status: Up to date Flu Vaccine status: Up to date Pneumococcal vaccine status: Up to date Covid-19 vaccine status: Completed vaccines  Qualifies for Shingles Vaccine? Yes   Zostavax completed Yes   Shingrix Completed?: Yes  Screening Tests Health Maintenance  Topic Date Due  . INFLUENZA VACCINE  10/03/2019  . COLONOSCOPY  12/06/2020  . TETANUS/TDAP  12/01/2028  . COVID-19 Vaccine  Completed  . Hepatitis C Screening  Completed  . PNA vac Low Risk Adult  Completed    Health Maintenance  There are no preventive care reminders to display for this patient.  Colorectal cancer screening: Completed 12/06/2013. Repeat every 7 years  Lung Cancer Screening: (Low Dose CT Chest recommended if Age 25-80 years, 30 pack-year currently smoking OR have quit w/in 15years.) does not qualify.   Lung Cancer Screening Referral: no  Additional Screening:  Hepatitis C Screening: does not qualify; Completed: yes  Vision Screening: Recommended annual ophthalmology exams for early detection of glaucoma and other disorders of the eye. Is the patient up to date with their annual eye exam?  No  Who is the provider or what is the name of the office in which the patient attends annual eye exams? [facility]; patient stated that he will schedule an eye exam. If pt is not established with a provider, would they like to be referred to a provider to establish care? No .   Dental Screening: Recommended annual dental exams for proper oral hygiene  Community Resource Referral / Chronic Care Management: CRR required this visit?  No    CCM required this visit?  No      Plan:     I have personally reviewed and noted the following in the patient's chart:   . Medical and  social history . Use of alcohol, tobacco or illicit drugs  . Current medications and supplements . Functional ability and status . Nutritional status . Physical activity . Advanced directives . List of other physicians . Hospitalizations, surgeries, and ER visits in previous 12 months . Vitals . Screenings to include cognitive, depression, and falls . Referrals and appointments  In addition, I have reviewed and discussed with patient certain preventive protocols, quality metrics, and best practice recommendations. A written personalized care plan for preventive services as well as general preventive health recommendations were provided to patient.     Sheral Flow, LPN   [id number]/50/[id number]   Nurse Notes:  Patient is cogitatively intact. There were no vitals filed for this visit. There is no height or weight on file to calculate BMI. Patient stated that he has no issues with gait or balance; does not use any assistive devices.

## 2019-09-16 NOTE — Patient Instructions (Signed)
[name] , Thank you for taking time to come for your Medicare Wellness Visit. I appreciate your ongoing commitment to your health goals. Please review the following plan we discussed and let me know if I can assist you in the future.   Screening recommendations/referrals: Colonoscopy: 12/06/2013; due every 7 years Recommended yearly ophthalmology/optometry visit for glaucoma screening and checkup Recommended yearly dental visit for hygiene and checkup  Vaccinations: Influenza vaccine: 12/02/2018 Pneumococcal vaccine: completed Tdap vaccine: 12/02/2018 Shingles vaccine: completed   Covid-19: completed  Advanced directives: Advance directive discussed with you today. Even though you declined this today please call our office should you change your mind and we can give you the proper paperwork for you to fill out.   Conditions/risks identified: Yes; Please schedule an eye exam for routine eye check.  Next appointment: Please schedule your next Annual Wellness Visit for Medicare in 1 year.  Preventive Care 67 Years and Older, Male Preventive care refers to lifestyle choices and visits with your health care provider that can promote health and wellness. What does preventive care include?  A yearly physical exam. This is also called an annual well check.  Dental exams once or twice a year.  Routine eye exams. Ask your health care provider how often you should have your eyes checked.  Personal lifestyle choices, including:  Daily care of your teeth and gums.  Regular physical activity.  Eating a healthy diet.  Avoiding tobacco and drug use.  Limiting alcohol use.  Practicing safe sex.  Taking low doses of aspirin every day.  Taking vitamin and mineral supplements as recommended by your health care provider. What happens during an annual well check? The services and screenings done by your health care provider during your annual well check will depend on your age, overall  health, lifestyle risk factors, and family history of disease. Counseling  Your health care provider may ask you questions about your:  Alcohol use.  Tobacco use.  Drug use.  Emotional well-being.  Home and relationship well-being.  Sexual activity.  Eating habits.  History of falls.  Memory and ability to understand (cognition).  Work and work Statistician. Screening  You may have the following tests or measurements:  Height, weight, and BMI.  Blood pressure.  Lipid and cholesterol levels. These may be checked every 5 years, or more frequently if you are over 23 years old.  Skin check.  Lung cancer screening. You may have this screening every year starting at age 81 if you have a 30-pack-year history of smoking and currently smoke or have quit within the past 15 years.  Fecal occult blood test (FOBT) of the stool. You may have this test every year starting at age 30.  Flexible sigmoidoscopy or colonoscopy. You may have a sigmoidoscopy every 5 years or a colonoscopy every 10 years starting at age 59.  Prostate cancer screening. Recommendations will vary depending on your family history and other risks.  Hepatitis C blood test.  Hepatitis B blood test.  Sexually transmitted disease (STD) testing.  Diabetes screening. This is done by checking your blood sugar (glucose) after you have not eaten for a while (fasting). You may have this done every 1-3 years.  Abdominal aortic aneurysm (AAA) screening. You may need this if you are a current or former smoker.  Osteoporosis. You may be screened starting at age 72 if you are at high risk. Talk with your health care provider about your test results, treatment options, and if necessary, the  need for more tests. Vaccines  Your health care provider may recommend certain vaccines, such as:  Influenza vaccine. This is recommended every year.  Tetanus, diphtheria, and acellular pertussis (Tdap, Td) vaccine. You may need a Td  booster every 10 years.  Zoster vaccine. You may need this after age 13.  Pneumococcal 13-valent conjugate (PCV13) vaccine. One dose is recommended after age 38.  Pneumococcal polysaccharide (PPSV23) vaccine. One dose is recommended after age 44. Talk to your health care provider about which screenings and vaccines you need and how often you need them. This information is not intended to replace advice given to you by your health care provider. Make sure you discuss any questions you have with your health care provider. Document Released: 03/17/2015 Document Revised: 11/08/2015 Document Reviewed: 12/20/2014 Elsevier Interactive Patient Education  2017 [place] Prevention in the Home Falls can cause injuries. They can happen to people of all ages. There are many things you can do to make your home safe and to help prevent falls. What can I do on the outside of my home?  Regularly fix the edges of walkways and driveways and fix any cracks.  Remove anything that might make you trip as you walk through a door, such as a raised step or threshold.  Trim any bushes or trees on the path to your home.  Use bright outdoor lighting.  Clear any walking paths of anything that might make someone trip, such as rocks or tools.  Regularly check to see if handrails are loose or broken. Make sure that both sides of any steps have handrails.  Any raised decks and porches should have guardrails on the edges.  Have any leaves, snow, or ice cleared regularly.  Use sand or salt on walking paths during winter.  Clean up any spills in your garage right away. This includes oil or grease spills. What can I do in the bathroom?  Use night lights.  Install grab bars by the toilet and in the tub and shower. Do not use towel bars as grab bars.  Use non-skid mats or decals in the tub or shower.  If you need to sit down in the shower, use a plastic, non-slip stool.  Keep the floor dry. Clean up  any water that spills on the floor as soon as it happens.  Remove soap buildup in the tub or shower regularly.  Attach bath mats securely with double-sided non-slip rug tape.  Do not have throw rugs and other things on the floor that can make you trip. What can I do in the bedroom?  Use night lights.  Make sure that you have a light by your bed that is easy to reach.  Do not use any sheets or blankets that are too big for your bed. They should not hang down onto the floor.  Have a firm chair that has side arms. You can use this for support while you get dressed.  Do not have throw rugs and other things on the floor that can make you trip. What can I do in the kitchen?  Clean up any spills right away.  Avoid walking on wet floors.  Keep items that you use a lot in easy-to-reach places.  If you need to reach something above you, use a strong step stool that has a grab bar.  Keep electrical cords out of the way.  Do not use floor polish or wax that makes floors slippery. If you must use wax,  use non-skid floor wax.  Do not have throw rugs and other things on the floor that can make you trip. What can I do with my stairs?  Do not leave any items on the stairs.  Make sure that there are handrails on both sides of the stairs and use them. Fix handrails that are broken or loose. Make sure that handrails are as long as the stairways.  Check any carpeting to make sure that it is firmly attached to the stairs. Fix any carpet that is loose or worn.  Avoid having throw rugs at the top or bottom of the stairs. If you do have throw rugs, attach them to the floor with carpet tape.  Make sure that you have a light switch at the top of the stairs and the bottom of the stairs. If you do not have them, ask someone to add them for you. What else can I do to help prevent falls?  Wear shoes that:  Do not have high heels.  Have rubber bottoms.  Are comfortable and fit you well.  Are  closed at the toe. Do not wear sandals.  If you use a stepladder:  Make sure that it is fully opened. Do not climb a closed stepladder.  Make sure that both sides of the stepladder are locked into place.  Ask someone to hold it for you, if possible.  Clearly mark and make sure that you can see:  Any grab bars or handrails.  First and last steps.  Where the edge of each step is.  Use tools that help you move around (mobility aids) if they are needed. These include:  Canes.  Walkers.  Scooters.  Crutches.  Turn on the lights when you go into a dark area. Replace any light bulbs as soon as they burn out.  Set up your furniture so you have a clear path. Avoid moving your furniture around.  If any of your floors are uneven, fix them.  If there are any pets around you, be aware of where they are.  Review your medicines with your doctor. Some medicines can make you feel dizzy. This can increase your chance of falling. Ask your doctor what other things that you can do to help prevent falls. This information is not intended to replace advice given to you by your health care provider. Make sure you discuss any questions you have with your health care provider. Document Released: 12/15/2008 Document Revised: 07/27/2015 Document Reviewed: 03/25/2014 Elsevier Interactive Patient Education  2017 [employer].

## 2019-11-04 DIAGNOSIS — Z85828 Personal history of other malignant neoplasm of skin: Secondary | ICD-10-CM | POA: Diagnosis not present

## 2019-11-04 DIAGNOSIS — D225 Melanocytic nevi of trunk: Secondary | ICD-10-CM | POA: Diagnosis not present

## 2019-11-04 DIAGNOSIS — D2262 Melanocytic nevi of left upper limb, including shoulder: Secondary | ICD-10-CM | POA: Diagnosis not present

## 2019-11-04 DIAGNOSIS — L820 Inflamed seborrheic keratosis: Secondary | ICD-10-CM | POA: Diagnosis not present

## 2019-11-04 DIAGNOSIS — L821 Other seborrheic keratosis: Secondary | ICD-10-CM | POA: Diagnosis not present

## 2019-11-04 DIAGNOSIS — D485 Neoplasm of uncertain behavior of skin: Secondary | ICD-10-CM | POA: Diagnosis not present

## 2019-11-04 DIAGNOSIS — D2272 Melanocytic nevi of left lower limb, including hip: Secondary | ICD-10-CM | POA: Diagnosis not present

## 2019-11-04 DIAGNOSIS — D1801 Hemangioma of skin and subcutaneous tissue: Secondary | ICD-10-CM | POA: Diagnosis not present

## 2019-11-25 ENCOUNTER — Ambulatory Visit: Payer: Medicare HMO | Attending: Internal Medicine

## 2019-11-25 DIAGNOSIS — Z23 Encounter for immunization: Secondary | ICD-10-CM

## 2019-11-25 NOTE — Progress Notes (Signed)
   Covid-[facility] Vaccination Clinic  Name:  Donald Wilkins    MRN: [id number] DOB: 1950-08-20  11/25/2019  Donald Wilkins was observed post Covid-19 immunization for 15 minutes without incident. He was provided with Vaccine Information Sheet and instruction to access the V-Safe system.   Donald Wilkins was instructed to call 911 with any severe reactions post vaccine: [name] Difficulty breathing  . Swelling of face and throat  . A fast heartbeat  . A bad rash all over body  . Dizziness and weakness

## 2019-12-27 DIAGNOSIS — R69 Illness, unspecified: Secondary | ICD-10-CM | POA: Diagnosis not present

## 2020-01-19 ENCOUNTER — Telehealth (INDEPENDENT_AMBULATORY_CARE_PROVIDER_SITE_OTHER): Payer: Medicare HMO | Admitting: Internal Medicine

## 2020-01-19 ENCOUNTER — Encounter: Payer: Self-pay | Admitting: Internal Medicine

## 2020-01-19 DIAGNOSIS — K529 Noninfective gastroenteritis and colitis, unspecified: Secondary | ICD-10-CM | POA: Diagnosis not present

## 2020-01-19 MED ORDER — METRONIDAZOLE 500 MG PO TABS: 500.0000 mg | ORAL_TABLET | Freq: Three times a day (TID) | ORAL | 0 refills | Status: AC

## 2020-01-19 MED ORDER — CIPROFLOXACIN HCL 500 MG PO TABS: 500.0000 mg | ORAL_TABLET | Freq: Two times a day (BID) | ORAL | 0 refills | Status: AC

## 2020-01-19 NOTE — Progress Notes (Signed)
Virtual Visit via failed Video Note  I connected with Donald Wilkins on 01/19/20 at  2:15 PM EST by a video and he was able to see me, but I was unable to see him.  Our audio was working well.  I verified that I am speaking with the correct person using two identifiers.   I discussed the limitations of evaluation and management by telemedicine and the availability of in person appointments. The patient expressed understanding and agreed to proceed.  Present for the visit:  Myself, Dr Billey Gosling, Vickki Hearing.  The patient is currently at home and I am in the office.    No referring provider.    History of Present Illness: This is an acute visit for diarrhea and cramping   The past couple of nights - he was waking at 3-4 am and having intestinal cramps.  It would last 1-2 hours and then go away.  Today he has had intermittent slight pain, but it is mild.  It is much worse at night.   The pain is central abdomen.  It feels similar to his 07/2018 pain where he had enteritis.    No change in medications or supplements.  He denies fevers, but may have had chills.  He denies feeling sick.   Review of Systems  Constitutional: Positive for chills (the first night). Negative for fever.       Appetite normal  Gastrointestinal: Positive for abdominal pain. Negative for blood in stool, constipation, diarrhea, melena and nausea.  Genitourinary: Negative for dysuria, frequency and hematuria.  Musculoskeletal: Negative for back pain.  Neurological: Negative for dizziness and headaches.      Social History   Socioeconomic History  . Marital status: Married    Spouse name: Not on file  . Number of children: Not on file  . Years of education: Not on file  . Highest education level: Not on file  Occupational History  . Not on file  Tobacco Use  . Smoking status: Former Smoker    Types: Cigarettes    Quit date: 03/05/1971    Years since quitting: 48.9  . Smokeless tobacco: Never Used  . Tobacco  comment: smoked  1970-1973, up to 1/2 ppd  Substance and Sexual Activity  . Alcohol use: Yes    Comment: socially; 2-3 / week  . Drug use: No  . Sexual activity: Not on file  Other Topics Concern  . Not on file  Social History Narrative   Exercise: walks, chops wood   Social Determinants of Health   Financial Resource Strain: Low Risk   . Difficulty of Paying Living Expenses: Not hard at all  Food Insecurity: No Food Insecurity  . Worried About Charity fundraiser in the Last Year: Never true  . Ran Out of Food in the Last Year: Never true  Transportation Needs: No Transportation Needs  . Lack of Transportation (Medical): No  . Lack of Transportation (Non-Medical): No  Physical Activity: Sufficiently Active  . Days of Exercise per Week: 5 days  . Minutes of Exercise per Session: 30 min  Stress: No Stress Concern Present  . Feeling of Stress : Not at all  Social Connections: Socially Integrated  . Frequency of Communication with Friends and Family: More than three times a week  . Frequency of Social Gatherings with Friends and Family: More than three times a week  . Attends Religious Services: More than 4 times per year  . Active Member of Clubs or  Organizations: Yes  . Attends Archivist Meetings: More than 4 times per year  . Marital Status: Married     Observations/Objective:    Assessment and Plan:  See Problem List for Assessment and Plan of chronic medical problems.   Follow Up Instructions:    I discussed the assessment and treatment plan with the patient. The patient was provided an opportunity to ask questions and all were answered. The patient agreed with the plan and demonstrated an understanding of the instructions.   The patient was advised to call back or seek an in-person evaluation if the symptoms worsen or if the condition fails to improve as anticipated.    Binnie Rail, MD

## 2020-01-19 NOTE — Assessment & Plan Note (Signed)
Acute Central abdominal pain/cramping - similar to 18 months ago - suggestive of recurrent enteritis Will treat empirically - cipro/flagyl - this did help last episode cipro 500mg  BID and Flagyl 500vmg TID  X 7 days Will refer to GI - ? Need further evaluation  Call if no improvement

## 2020-01-21 ENCOUNTER — Telehealth: Payer: Self-pay | Admitting: Internal Medicine

## 2020-01-21 NOTE — Telephone Encounter (Signed)
Spoke with patient today and info given. 

## 2020-01-21 NOTE — Telephone Encounter (Signed)
Should complete the 7 days

## 2020-01-21 NOTE — Telephone Encounter (Signed)
   Patient wants to know if he needs to continue taking ciprofloxacin (CIPRO) 500 MG tablet and metroNIDAZOLE (FLAGYL) 500 MG tablet  Patient states he is not having diarrhea and cramping any longer

## 2020-02-16 NOTE — Progress Notes (Signed)
Subjective:    Patient ID: Donald Wilkins, male    DOB: 08-Jul-1950, 69 y.o.   MRN: [id number]  HPI The patient is here for an acute visit for occasional dizziness and fatigue..  Over the past 4 weeks he has had several episodes of fatigue, SOB, dizziness/lightheadedness and chest tightness.  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  He can play the drums w/o any issues.  Yesterday he carried something 30-40 lbs down the stairs and outside and he felt very winded.[name]  He walks 2 miles every other day and has no symptoms.  He thinks it occurs more with anaerobic activity.  After he sits for several minutes he is fine.     One situation he had not eaten for  several hours, but typically he eats and drinks regularly.   Medications and allergies reviewed with patient and updated if appropriate.  Patient Active Problem List   Diagnosis Date Noted  . Erectile dysfunction 03/22/2019  . Rash 10/12/2018  . Right lower quadrant abdominal pain 07/20/2018  . Enteritis 07/20/2018  . Depression 02/27/2016  . 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 Pain in joint, lower leg 02/22/2013  . Foot pain, right 02/06/2012  . 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 History of skin cancer 08/07/2009  . Palpitations 09/18/2007  . MITRAL VALVE PROLAPSE, HX OF 05/28/2007    Current Outpatient Medications on File Prior to Visit  Medication Sig Dispense Refill  . aspirin 81 MG tablet Take 81 mg by mouth daily.    . diclofenac sodium (VOLTAREN) 1 % GEL Apply 4 g topically 4 (four) times daily. 100 g 5  . finasteride (PROSCAR) 5 MG tablet TAKE 1/2 TABLET BY MOUTH EVERY 2 DAYS 90 tablet 3  . FLUoxetine (PROZAC) 10 MG capsule TAKE ONE CAPSULE BY MOUTH ONCE DAILY 90 capsule 3  . sildenafil (REVATIO) 20 MG tablet Take 2-5 pills as needed 40 tablet 5   No current facility-administered medications on file prior to visit.    Past Medical History:  Diagnosis Date  .  Basal cell cancer    X 2;[facility]  . Hyperlipidemia   . Incomplete right bundle branch block   . MVP (mitral valve prolapse)    on ECHO    Past Surgical History:  Procedure Laterality Date  . ANAL FISSURE REPAIR    . BASAL CELL CARCINOMA EXCISION  2011 & 2012   Mohs L face; back  . colonoscopy with polypectomy  2005   [facility], Dr Deatra Ina; negative 2010    Social History   Socioeconomic History  . Marital status: Married    Spouse name: Not on file  . Number of children: Not on file  . Years of education: Not on file  . Highest education level: Not on file  Occupational History  . Not on file  Tobacco Use  . Smoking status: Former Smoker    Types: Cigarettes    Quit date: 03/05/1971    Years since quitting: 48.9  . Smokeless tobacco: Never Used  . Tobacco comment: smoked  1970-1973, up to 1/2 ppd  Substance and Sexual Activity  . Alcohol use: Yes    Comment: socially; 2-3 / week  . Drug use: No  . Sexual activity: Not on file  Other Topics Concern  . Not on file  Social History Narrative   Exercise: walks, chops wood   Social Determinants of  Health   Financial Resource Strain: Low Risk   . Difficulty of Paying Living Expenses: Not hard at all  Food Insecurity: No Food Insecurity  . Worried About Charity fundraiser in the Last Year: Never true  . Ran Out of Food in the Last Year: Never true  Transportation Needs: No Transportation Needs  . Lack of Transportation (Medical): No  . Lack of Transportation (Non-Medical): No  Physical Activity: Sufficiently Active  . Days of Exercise per Week: 5 days  . Minutes of Exercise per Session: 30 min  Stress: No Stress Concern Present  . Feeling of Stress : Not at all  Social Connections: Socially Integrated  . Frequency of Communication with Friends and Family: More than three times a week  . Frequency of Social Gatherings with Friends and Family: More than three times a week  . Attends Religious Services: More than 4  times per year  . Active Member of Clubs or Organizations: Yes  . Attends Archivist Meetings: More than 4 times per year  . Marital Status: Married    Family History  Problem Relation Age of Onset  . Heart attack Mother 57  . Diabetes Father   . Lung cancer Father        smoker  . Heart attack Maternal Uncle        pre 55  . Cancer Brother        malignant,brain stem  . Diabetes Sister   . Hypertension Sister     Review of Systems  Constitutional: Positive for fatigue. Negative for chills, diaphoresis and fever.  Respiratory: Positive for shortness of breath. Negative for cough and wheezing.   Cardiovascular: Positive for chest pain (tightness in chest ). Negative for palpitations and leg swelling.  Gastrointestinal: Negative for nausea.  Genitourinary: Positive for scrotal swelling.  Neurological: Positive for dizziness and light-headedness. Negative for headaches.       Objective:   Vitals:   02/17/20 1125  BP: 134/72  Pulse: 62  Temp: 98.2 F (36.8 C)  SpO2: 97%   BP Readings from Last 3 Encounters:  02/17/20 134/72  03/22/19 132/80  03/18/18 122/78   Wt Readings from Last 3 Encounters:  02/17/20 199 lb (90.3 kg)  03/22/19 195 lb (88.5 kg)  03/18/18 194 lb 12.8 oz (88.4 kg)   Body mass index is 28.55 kg/m.   Physical Exam    Constitutional: Appears well-developed and well-nourished. No distress.  Head: Normocephalic and atraumatic.  Neck: Neck supple. No tracheal deviation present. No thyromegaly present.  No cervical lymphadenopathy Cardiovascular: Normal rate, irregular rhythm and normal heart sounds.  No murmur heard. No carotid bruit .  No edema Pulmonary/Chest: Effort normal and breath sounds normal. No respiratory distress. No has no wheezes. No rales.  Skin: Skin is warm and dry. Not diaphoretic.  Psychiatric: Normal mood and affect. Behavior is normal.      Assessment & Plan:    See Problem List for Assessment and Plan of  chronic medical problems.    This visit occurred during the SARS-CoV-2 public health emergency.  Safety protocols were in place, including screening questions prior to the visit, additional usage of staff PPE, and extensive cleaning of exam room while observing appropriate contact time as indicated for disinfecting solutions.

## 2020-02-17 ENCOUNTER — Encounter: Payer: Self-pay | Admitting: Internal Medicine

## 2020-02-17 ENCOUNTER — Ambulatory Visit (INDEPENDENT_AMBULATORY_CARE_PROVIDER_SITE_OTHER): Payer: Medicare HMO | Admitting: Internal Medicine

## 2020-02-17 ENCOUNTER — Other Ambulatory Visit: Payer: Self-pay

## 2020-02-17 VITALS — BP 134/72 | HR 62 | Temp 98.2°F | Ht 70.0 in | Wt 199.0 lb

## 2020-02-17 DIAGNOSIS — I4892 Unspecified atrial flutter: Secondary | ICD-10-CM | POA: Insufficient documentation

## 2020-02-17 DIAGNOSIS — I499 Cardiac arrhythmia, unspecified: Secondary | ICD-10-CM | POA: Diagnosis not present

## 2020-02-17 DIAGNOSIS — R0789 Other chest pain: Secondary | ICD-10-CM | POA: Diagnosis not present

## 2020-02-17 LAB — COMPREHENSIVE METABOLIC PANEL
ALT: 29 U/L (ref 0–53)
AST: 27 U/L (ref 0–37)
Albumin: 4.3 g/dL (ref 3.5–5.2)
Alkaline Phosphatase: 58 U/L (ref 39–117)
BUN: 16 mg/dL (ref 6–23)
CO2: 26 mEq/L (ref 19–32)
Calcium: 9.5 mg/dL (ref 8.4–10.5)
Chloride: 103 mEq/L (ref 96–112)
Creatinine, Ser: 0.94 mg/dL (ref 0.40–1.50)
GFR: 83.71 mL/min (ref >60.00)
Glucose, Bld: 91 mg/dL (ref 70–99)
Potassium: 4.2 mEq/L (ref 3.5–5.1)
Sodium: 137 mEq/L (ref 135–145)
Total Bilirubin: 0.8 mg/dL (ref 0.2–1.2)
Total Protein: 7.1 g/dL (ref 6.0–8.3)

## 2020-02-17 LAB — CBC WITH DIFFERENTIAL/PLATELET
Basophils Absolute: 0 10*3/uL (ref 0.0–0.1)
Basophils Relative: 0.5 % (ref 0.0–3.0)
Eosinophils Absolute: 0.1 10*3/uL (ref 0.0–0.7)
Eosinophils Relative: 1.1 % (ref 0.0–5.0)
HCT: 44.5 % (ref 39.0–52.0)
Hemoglobin: 15.4 g/dL (ref 13.0–17.0)
Lymphocytes Relative: 20.9 % (ref 12.0–46.0)
Lymphs Abs: 1.2 10*3/uL (ref 0.7–4.0)
MCHC: 34.5 g/dL (ref 30.0–36.0)
MCV: 94.3 fl (ref 78.0–100.0)
Monocytes Absolute: 0.6 10*3/uL (ref 0.1–1.0)
Monocytes Relative: 10 % (ref 3.0–12.0)
Neutro Abs: 3.9 10*3/uL (ref 1.4–7.7)
Neutrophils Relative %: 67.5 % (ref 43.0–77.0)
Platelets: 220 10*3/uL (ref 150.0–400.0)
RBC: 4.72 Mil/uL (ref 4.22–5.81)
RDW: 12.8 % (ref 11.5–15.5)
WBC: 5.8 10*3/uL (ref 4.0–10.5)

## 2020-02-17 LAB — TSH: TSH: 1.32 u[IU]/mL (ref 0.35–4.50)

## 2020-02-17 MED ORDER — METOPROLOL TARTRATE 25 MG PO TABS: 12.5000 mg | ORAL_TABLET | Freq: Two times a day (BID) | ORAL | 3 refills | Status: DC

## 2020-02-17 MED ORDER — APIXABAN 5 MG PO TABS: 5.0000 mg | ORAL_TABLET | Freq: Two times a day (BID) | ORAL | 1 refills | Status: DC

## 2020-02-17 NOTE — Assessment & Plan Note (Signed)
Acute Occurs with other symptoms - likely related to aflutter, ? Ischemia Referred to cardiology

## 2020-02-17 NOTE — Assessment & Plan Note (Signed)
Acute EKG - aflutter with variable AV block,  RSR V1, nonspecific ST abnormality.  Aflutter new since prior EKG Likely paroxysmal and the cause of his symptoms, may need ischemia w/u Start eliquis 5 mg BID Start metoprolol 12.5 mg BID Monitor BP and HR Cbc, cmp, tsh Refer to cardiology

## 2020-02-17 NOTE — Patient Instructions (Addendum)
  Blood work was ordered.    An EKG was done today.  It shows an abnormal rhythm called Atrial Flutter  Medications changes include :   Start eliquis 5 mg twice daily ( blood thinner)  And Metoprolol 12.5 mg twice daily ( low heart rate and BP and help prevent the abnormal rhythm)   Your prescription(s) have been submitted to your pharmacy. Please take as directed and contact our office if you believe you are having problem(s) with the medication(s).   A referral was ordered for cardiology.       Someone from their office will call you to schedule an appointment.

## 2020-02-29 ENCOUNTER — Encounter: Payer: Self-pay | Admitting: Gastroenterology

## 2020-03-08 ENCOUNTER — Ambulatory Visit: Payer: Medicare HMO | Admitting: Internal Medicine

## 2020-03-08 ENCOUNTER — Encounter: Payer: Self-pay | Admitting: Internal Medicine

## 2020-03-08 ENCOUNTER — Other Ambulatory Visit: Payer: Self-pay

## 2020-03-08 VITALS — BP 116/70 | HR 87 | Ht 70.0 in | Wt 200.0 lb

## 2020-03-08 DIAGNOSIS — E7849 Other hyperlipidemia: Secondary | ICD-10-CM

## 2020-03-08 DIAGNOSIS — I4892 Unspecified atrial flutter: Secondary | ICD-10-CM

## 2020-03-08 NOTE — Patient Instructions (Addendum)
Medication Instructions:  Your physician recommends that you continue on your current medications as directed. Please refer to the Current Medication list given to you today.  *If you need a refill on your cardiac medications before your next appointment, please call your pharmacy*   Lab Work: Your physician recommends that you return for lab work on Friday January 14. You do not have to fast. You may come in anytime between the hours of 7:30 am and 4:30 pm  If you have labs (blood work) drawn today and your tests are completely normal, you will receive your results only by: [name] MyChart Message (if you have MyChart) OR . A paper copy in the mail If you have any lab test that is abnormal or we need to change your treatment, we will call you to review the results.   Testing/Procedures: Your physician has requested that you have an echocardiogram. Echocardiography is a painless test that uses sound waves to create images of your heart. It provides your doctor with information about the size and shape of your heart and how well your heart's chambers and valves are working. This procedure takes approximately one hour. There are no restrictions for this procedure.   You are scheduled for a TEE/Cardioversion/TEE Cardioversion on Tues. Jan. 18 with Dr. Izora Ribas.  Please arrive at the [facility] (Main Entrance A) at [facility]: [address] [place], [place] [address] at am/pm. (1 hour prior to procedure unless lab work is needed; if lab work is needed arrive 1.5 hours ahead)  DIET: Nothing to eat or drink after midnight except a sip of water with medications (see medication instructions below)  Medication Instructions: You may take your morning medications with a sip of water Continue your anticoagulant: Eliquis You will need to continue your anticoagulant after your procedure until you  are told by your Provider that it is safe to stop   You must have a responsible person to  drive you home and stay in the waiting area during your procedure. Failure to do so could result in cancellation.  Bring your insurance cards.  *Special Note: Every effort is made to have your procedure done on time. Occasionally there are emergencies that occur at the hospital that may cause delays. Please be patient if a delay does occur.   Due to recent COVID-19 restrictions implemented by our local and state authorities and in an effort to keep both patients and staff as safe as possible, our hospital system requires COVID-19 testing prior to certain scheduled hospital procedures.  Please go to [address] [facility]. [place], [place] [address] on __Sat. Jan. 15__ at __10:00 am___  .  This is a drive up testing site.  You will not need to exit your vehicle.  You will not be billed at the time of testing but may receive a bill later depending on your insurance. You must agree to self-quarantine from the time of your testing until the procedure date on Tuesday Jan. 18.  This should included staying home with ONLY the people you live with.  Avoid take-out, grocery store shopping or leaving the house for any non-emergent reason.  Failure to have your COVID-19 test done on the date and time you have been scheduled will result in cancellation of your procedure.  Please call our office at [phone] if you have any questions.    Follow-Up: Your physician recommends that you schedule a follow-up appointment post Cardioversion with [facility]    At [facility], you and your health needs are our priority.  As part of our continuing mission to provide you with exceptional heart care, we have created designated Provider Care Teams.  These Care Teams include your primary Cardiologist (physician) and Advanced Practice Providers (APPs -  Physician Assistants and Nurse Practitioners) who all work together to provide you with the care you need, when you need it.   Your next appointment:   3 month(s)  The  format for your next appointment:   In Person  Provider:   You may see Rudean Haskell, MD or one of the following Advanced Practice Providers on your designated Care Team:    Melina Copa, PA-C  Ermalinda Barrios, PA-C

## 2020-03-08 NOTE — Addendum Note (Signed)
Addended by: Levi Aland on: 03/08/2020 01:41 PM   Modules accepted: Orders

## 2020-03-08 NOTE — Addendum Note (Signed)
Addended by: Riley Lam A on: 03/08/2020 12:48 PM   Modules accepted: Orders, SmartSet

## 2020-03-08 NOTE — Progress Notes (Signed)
Cardiology Office Note:    Date:  03/08/2020   ID:  Donald Wilkins, DOB Jun 24, 1950, MRN [id number]  PCP:  Binnie Rail, MD  [facility] Cardiologist:  No primary care provider on file.  [facility] Electrophysiologist:  None   CC: feeling winded Consulted for the evaluation of new AFl at the behest of Burns, Claudina Lick, MD  History of Present Illness:    Donald Wilkins is a 70 y.o. male with a hx of Atrial Flutter, HLD, MVP who presents for evaluation.  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  Has noted to be lightheaded. Saw PCP 02/27/20 and found to be in atrial flutter.  Feels low energy with anaerobic activity.  Started on metoprolol and feels a bit better, but has not played any gigs yet. No Has had no chest pain, chest pressure, chest tightness, chest stinging .  Still feels a lack of energy.  Patient exertion notable for  [place]working [place] and takes care of hoses without issues.  No syncope or near syncope since starting metoprolol.No leg swelling, abdominal swelling, but notes weight gain over the course of a year.  Drinks 3-4 beers a week.  Patient reports prior cardiac testing including 2015 OSH echo for chest pain while starting to run.   Past Medical History:  Diagnosis Date  . Basal cell cancer    X 2;[facility]  . Hyperlipidemia   . Incomplete right bundle branch block   . MVP (mitral valve prolapse)    on ECHO    Past Surgical History:  Procedure Laterality Date  . ANAL FISSURE REPAIR    . BASAL CELL CARCINOMA EXCISION  2011 & 2012   Mohs L face; back  . colonoscopy with polypectomy  2005   [facility], Dr Deatra Ina; negative 2010    Current Medications: Current Meds  Medication Sig  . apixaban (ELIQUIS) 5 MG TABS tablet Take 1 tablet (5 mg total) by mouth 2 (two) times daily.  . diclofenac sodium (VOLTAREN) 1 % GEL Apply 4 g topically 4 (four) times daily.  . finasteride (PROSCAR) 5 MG tablet  TAKE 1/2 TABLET BY MOUTH EVERY 2 DAYS  . FLUoxetine (PROZAC) 10 MG capsule TAKE ONE CAPSULE BY MOUTH ONCE DAILY  . metoprolol tartrate (LOPRESSOR) 25 MG tablet Take 0.5 tablets (12.5 mg total) by mouth 2 (two) times daily.  . sildenafil (REVATIO) 20 MG tablet Take 2-5 pills as needed     Allergies:   Patient has no known allergies.   Social History   Socioeconomic History  . Marital status: Married    Spouse name: Not on file  . Number of children: Not on file  . Years of education: Not on file  . Highest education level: Not on file  Occupational History  . Not on file  Tobacco Use  . Smoking status: Former Smoker    Types: Cigarettes    Quit date: 03/05/1971    Years since quitting: 49.0  . Smokeless tobacco: Never Used  . Tobacco comment: smoked  1970-1973, up to 1/2 ppd  Substance and Sexual Activity  . Alcohol use: Yes    Comment: socially; 2-3 / week  . Drug use: No  . Sexual activity: Not on file  Other Topics Concern  . Not on file  Social History Narrative   Exercise: walks, chops wood   Social Determinants of Health   Financial Resource Strain:  Low Risk   . Difficulty of Paying Living Expenses: Not hard at all  Food Insecurity: No Food Insecurity  . Worried About Programme researcher, broadcasting/film/video in the Last Year: Never true  . Ran Out of Food in the Last Year: Never true  Transportation Needs: No Transportation Needs  . Lack of Transportation (Medical): No  . Lack of Transportation (Non-Medical): No  Physical Activity: Sufficiently Active  . Days of Exercise per Week: 5 days  . Minutes of Exercise per Session: 30 min  Stress: No Stress Concern Present  . Feeling of Stress : Not at all  Social Connections: Socially Integrated  . Frequency of Communication with Friends and Family: More than three times a week  . Frequency of Social Gatherings with Friends and Family: More than three times a week  . Attends Religious Services: More than 4 times per year  . Active Member  of Clubs or Organizations: Yes  . Attends Banker Meetings: More than 4 times per year  . Marital Status: Married     Family History: The patient's family history includes Cancer in his brother; Diabetes in his father and sister; Heart attack in his maternal uncle; Heart attack (age of onset: 65) in his mother; Hypertension in his sister; Lung cancer in his father.   History of coronary artery disease notable for mother and uncle. History of heart failure notable for no members. History of arrhythmia notable for no members. No history of bicuspid aortic valve or aortic aneurysm or dissection.  ROS:   Please see the history of present illness.     All other systems reviewed and are negative.  EKGs/Labs/Other Studies Reviewed:    The following studies were reviewed today:  EKG:   03/08/20: Atrial Flutter 87 02/17/20: AFl rate 98   Recent Labs: 02/17/2020: ALT 29; BUN 16; Creatinine, Ser 0.94; Hemoglobin 15.4; Platelets 220.0; Potassium 4.2; Sodium 137; TSH 1.32  Recent Lipid Panel    Component Value Date/Time   CHOL 177 03/22/2019 0843   TRIG 66.0 03/22/2019 0843   HDL 61.50 03/22/2019 0843   CHOLHDL 3 03/22/2019 0843   VLDL 13.2 03/22/2019 0843   LDLCALC 102 (H) 03/22/2019 0843    Risk Assessment/Calculations:     CHA2DS2-VASc Score = 1  This indicates a 0.6% annual risk of stroke. The patient's score is based upon: CHF History: No HTN History: No Diabetes History: No Stroke History: No Vascular Disease History: No Age Score: 1 Gender Score: 0    Physical Exam:    VS:  BP 116/70   Pulse 87   Ht 5\' 10"  (1.778 m)   Wt 200 lb (90.7 kg)   SpO2 99%   BMI 28.70 kg/m     Wt Readings from Last 3 Encounters:  03/08/20 200 lb (90.7 kg)  02/17/20 199 lb (90.3 kg)  03/22/19 195 lb (88.5 kg)    GEN:  Well nourished, well developed in no acute distress HEENT: Normal NECK: No JVD; No carotid bruits LYMPHATICS: No lymphadenopathy CARDIAC: irregularly  irregular, no murmurs, rubs, gallops RESPIRATORY:  Clear to auscultation without rales, wheezing or rhonchi  ABDOMEN: Soft, non-tender, non-distended MUSCULOSKELETAL:  No edema; No deformity  SKIN: Warm and dry NEUROLOGIC:  Alert and oriented x 3 PSYCHIATRIC:  Normal affect   ASSESSMENT:    1. Atrial flutter, unspecified type (HCC)   2. Other hyperlipidemia    PLAN:    In order of problems listed above:  Atrial Flutter - Risk factors  include age - CHADSVASC=1. - TSH 1.32 - discussed alcohol and exercise as preventive factors - OSA Risk low presently - Will get obtain TTE.   - Continue anticoagulation with Eliquis.  - Continue rate control with metoprolol - Rhythm control options are available - Considerations for DCCV 03/20/20  Hyperlipidemia (familial/mixed) -LDL goal less than 100 - gave education on dietary changes; will check at next visit  Two-three month follow up unless new symptoms or abnormal test results warranting change in plan  Would be reasonable for  APP Follow up  Medication Adjustments/Labs and Tests Ordered: Current medicines are reviewed at length with the patient today.  Concerns regarding medicines are outlined above.  Orders Placed This Encounter  Procedures  . Basic Metabolic Panel (BMET)  . CBC  . EKG 12-Lead   No orders of the defined types were placed in this encounter.   Patient Instructions  Medication Instructions:  Your physician recommends that you continue on your current medications as directed. Please refer to the Current Medication list given to you today.  *If you need a refill on your cardiac medications before your next appointment, please call your pharmacy*   Lab Work: Your physician recommends that you return for lab work on Friday January 14. You do not have to fast. You may come in anytime between the hours of 7:30 am and 4:30 pm  If you have labs (blood work) drawn today and your tests are completely normal, you  will receive your results only by: [name] MyChart Message (if you have MyChart) OR . A paper copy in the mail If you have any lab test that is abnormal or we need to change your treatment, we will call you to review the results.   Testing/Procedures: You are scheduled for a TEE/Cardioversion/TEE Cardioversion on Tues. Jan. 18 with Dr. Gasper Sells.  Please arrive at the [facility] (Main Entrance A) at [facility]: [address] [place], [place] [address] at am/pm. (1 hour prior to procedure unless lab work is needed; if lab work is needed arrive 1.5 hours ahead)  DIET: Nothing to eat or drink after midnight except a sip of water with medications (see medication instructions below)  Medication Instructions: You may take your morning medications with a sip of water Continue your anticoagulant: Eliquis You will need to continue your anticoagulant after your procedure until you  are told by your Provider that it is safe to stop   You must have a responsible person to drive you home and stay in the waiting area during your procedure. Failure to do so could result in cancellation.  Bring your insurance cards.  *Special Note: Every effort is made to have your procedure done on time. Occasionally there are emergencies that occur at the hospital that may cause delays. Please be patient if a delay does occur.   Due to recent COVID-19 restrictions implemented by our local and state authorities and in an effort to keep both patients and staff as safe as possible, our hospital system requires COVID-19 testing prior to certain scheduled hospital procedures.  Please go to [place]. [place], [place] [address] on __Sat. Jan. 15__ at __10:00 am___  .  This is a drive up testing site.  You will not need to exit your vehicle.  You will not be billed at the time of testing but may receive a bill later depending on your insurance. You must agree to self-quarantine from the time of your testing until  the  procedure date on Tuesday Jan. 18.  This should included staying home with ONLY the people you live with.  Avoid take-out, grocery store shopping or leaving the house for any non-emergent reason.  Failure to have your COVID-19 test done on the date and time you have been scheduled will result in cancellation of your procedure.  Please call our office at [phone] if you have any questions.    Follow-Up: Your physician recommends that you schedule a follow-up appointment post Cardioversion with [facility], you and your health needs are our priority.  As part of our continuing mission to provide you with exceptional heart care, we have created designated Provider Care Teams.  These Care Teams include your primary Cardiologist (physician) and Advanced Practice Providers (APPs -  Physician Assistants and Nurse Practitioners) who all work together to provide you with the care you need, when you need it.   Your next appointment:   3 month(s)  The format for your next appointment:   In Person  Provider:   You may see Rudean Haskell, MD or one of the following Advanced Practice Providers on your designated Care Team:    Melina Copa, PA-C  Ermalinda Barrios, PA-C        Signed, Werner Lean, MD  03/08/2020 10:11 AM    [place]

## 2020-03-14 ENCOUNTER — Other Ambulatory Visit: Payer: Self-pay | Admitting: Internal Medicine

## 2020-03-17 ENCOUNTER — Other Ambulatory Visit: Payer: Medicare HMO | Admitting: *Deleted

## 2020-03-17 ENCOUNTER — Other Ambulatory Visit: Payer: Self-pay

## 2020-03-17 DIAGNOSIS — E7849 Other hyperlipidemia: Secondary | ICD-10-CM | POA: Diagnosis not present

## 2020-03-17 DIAGNOSIS — I4892 Unspecified atrial flutter: Secondary | ICD-10-CM | POA: Diagnosis not present

## 2020-03-18 ENCOUNTER — Other Ambulatory Visit (HOSPITAL_COMMUNITY)
Admission: RE | Admit: 2020-03-18 | Discharge: 2020-03-18 | Disposition: A | Discharge status: Home / Self Care | Payer: Medicare HMO | Source: Ambulatory Visit | Attending: Internal Medicine | Admitting: Internal Medicine

## 2020-03-18 DIAGNOSIS — Z20822 Contact with and (suspected) exposure to covid-19: Secondary | ICD-10-CM | POA: Diagnosis not present

## 2020-03-18 DIAGNOSIS — Z01812 Encounter for preprocedural laboratory examination: Secondary | ICD-10-CM | POA: Insufficient documentation

## 2020-03-18 LAB — BASIC METABOLIC PANEL
BUN/Creatinine Ratio: 18 (ref 10–24)
BUN: 16 mg/dL (ref 8–27)
CO2: 24 mmol/L (ref 20–29)
Calcium: 9.3 mg/dL (ref 8.6–10.2)
Chloride: 102 mmol/L (ref 96–106)
Creatinine, Ser: 0.88 mg/dL (ref 0.76–1.27)
GFR calc Af Amer: 103 mL/min/{1.73_m2} (ref >59)
GFR calc non Af Amer: 89 mL/min/{1.73_m2} (ref >59)
Glucose: 98 mg/dL (ref 65–99)
Potassium: 4.7 mmol/L (ref 3.5–5.2)
Sodium: 139 mmol/L (ref 134–144)

## 2020-03-18 LAB — CBC
Hematocrit: 40.8 % (ref 37.5–51.0)
Hemoglobin: 14.3 g/dL (ref 13.0–17.7)
MCH: 31.8 pg (ref 26.6–33.0)
MCHC: 35 g/dL (ref 31.5–35.7)
MCV: 91 fL (ref 79–97)
Platelets: 315 10*3/uL (ref 150–450)
RBC: 4.5 x10E6/uL (ref 4.14–5.80)
RDW: 11.9 % (ref 11.6–15.4)
WBC: 6.5 10*3/uL (ref 3.4–10.8)

## 2020-03-18 LAB — SARS CORONAVIRUS 2 (TAT 6-24 HRS): SARS Coronavirus 2: NEGATIVE

## 2020-03-20 ENCOUNTER — Telehealth: Payer: Self-pay | Admitting: *Deleted

## 2020-03-20 NOTE — Telephone Encounter (Signed)
Message from Werner Lean, MD sent at 03/20/2020 10:17 AM EST -----  Patient described URI sx; would benefit from deferring DCCV until improve. Would schedule in 2 weeks (week of 31st). Will need to repeat COVID-screening. I am unable to perform the procedure that week; any provider with openings for Reader B would be appropriate.  Thanks, Werner Lean, MD

## 2020-03-20 NOTE — Telephone Encounter (Signed)
Called the pt to endorse to him his pre-cardioversion labs, per Dr. Gasper Sells.   When speaking with the pt, he mentioned that he woke up this morning with a minor dry cough, and explains it as "feeling like a chest cold."  Had the pt take his temp while on the phone with me, and his current temperature is 99.5.  Pt states he has no  N/V, diarrhea, sinus issues, runny nose, sore throat, and he can taste and smell right now.  He states he has a terrible dry-hacking cough, and constantly feels like he has to clear his throat.  Pt states he woke up this morning with this complaint.  Pt states he has not been around anyone with a positive covid case.    He states he lives at home with his wife and older son, and they are both healthy at this time, with no complaints of cold-like symptoms.  Pt states he is fully vaccinated and boosted.   Pt states he has been quarantined since his covid test he had done on this past Saturday, 1/15.  Pts covid test for his DCCV was negative.    Called over to endo/PAT,  and left a message with the secretary there, to have Minna Merritts RN call me back to discuss pts new onset cough and low-grade fever, with DCCV scheduled for tomorrow 1/18.  Will await Minna Merritts RN to return a call back, to get assistance with determining if pt will need to reschedule his procedure for tomorrow due to cough and low-grade fever, or if this decision will need to come from the performing Cardiologist, Dr. Gasper Sells.     Will await Minna Merritts RN to call back.

## 2020-03-20 NOTE — Telephone Encounter (Signed)
Called Endo and spoke with Donald Wilkins about cancelling pts DCCV for tomorrow 1/18 at 1:30 pm, for Dr. Gasper Sells to do. Per Donald Wilkins, she cancelled his DCCV.  Kim verbalized understanding and agrees with this plan.

## 2020-03-20 NOTE — Telephone Encounter (Signed)
Spoke with Minna Merritts RN with PAT, and informed her of this matter.  Per Minna Merritts, she states that they do not have a policy/protocol for this, being the pt tested  negative for covid on this past Saturday, then started developing symptoms thereafter.   Per Minna Merritts RN, the decision as to if the pt needs to reschedule his DCCV or keep it as planned for tomorrow with Dr. Gasper Sells, has to come from Dr. Gasper Sells himself.   Will route this message to Dr. Gasper Sells to further review and advise on this matter, and follow-up with the pt accordingly thereafter.   Pt made aware of this, and agrees with this plan.

## 2020-03-20 NOTE — Telephone Encounter (Signed)
Spoke with the pt and informed him that Dr. Gasper Sells advised for him to cancel his DCCV as scheduled for tomorrow, due to URI.  Informed the pt that he would like for him to reschedule for 2 weeks out.  Pt states he prefers calling us back in about a week or so, and informing us how his symptoms are, and reschedule his DCCV at that time, so that he doesn't have to have any work-up repeated, more than it has to be.  Pt is aware that when he calls [place] to reschedule his DCCV based on symptom improvement, he will have to have repeated labs and covid test.  Pt states he is ok with that.  Pt states he will call us in about a week or so, when he feels better, to reschedule his DCCV.  Pt was appreciative for all the assistance provided.  Pt verbalized understanding and agrees with this plan. Best wishes provided to the pt on having a speedy recovery.

## 2020-03-20 NOTE — Telephone Encounter (Signed)
-----   Message from Werner Lean, MD sent at 03/19/2020  2:37 PM EST ----- Results: Safe for Cardioversion  Werner Lean, MD

## 2020-03-21 ENCOUNTER — Ambulatory Visit: Payer: Medicare HMO | Admitting: Gastroenterology

## 2020-03-21 ENCOUNTER — Encounter (HOSPITAL_COMMUNITY): Admission: RE | Payer: Self-pay | Source: Home / Self Care

## 2020-03-21 ENCOUNTER — Ambulatory Visit (HOSPITAL_COMMUNITY): Admission: RE | Admit: 2020-03-21 | Payer: Medicare HMO | Source: Home / Self Care | Admitting: Internal Medicine

## 2020-03-21 SURGERY — CARDIOVERSION
Anesthesia: General

## 2020-03-27 ENCOUNTER — Other Ambulatory Visit: Payer: Self-pay

## 2020-03-27 ENCOUNTER — Ambulatory Visit (HOSPITAL_COMMUNITY): Payer: Medicare HMO | Attending: Cardiology

## 2020-03-27 DIAGNOSIS — I4892 Unspecified atrial flutter: Secondary | ICD-10-CM

## 2020-03-27 LAB — ECHOCARDIOGRAM COMPLETE
Area-P 1/2: 5.56 cm2
MV M vel: 5.16 m/s
MV Peak grad: 106.5 mmHg
S' Lateral: 3.1 cm

## 2020-03-28 ENCOUNTER — Ambulatory Visit (HOSPITAL_COMMUNITY): Payer: Medicare HMO | Admitting: Physician Assistant

## 2020-03-29 ENCOUNTER — Other Ambulatory Visit: Payer: Self-pay

## 2020-03-29 ENCOUNTER — Telehealth: Payer: Self-pay | Admitting: Internal Medicine

## 2020-03-29 DIAGNOSIS — I4892 Unspecified atrial flutter: Secondary | ICD-10-CM

## 2020-03-29 NOTE — Telephone Encounter (Signed)
DCCV scheduled on 04/12/20.   Lab and COVID test appt scheduled on 04/10/20.  Instruction Letter sent to patient via mychart.  Reviewed patient instructions via phone call. Patient verbalized understanding.

## 2020-03-29 NOTE — Progress Notes (Signed)
DCCV scheduled for 04/12/20. Instruction letter has been sent through mychart. Patient made aware. All questions answered. Lab and COVID appt made for 04/10/20.

## 2020-03-29 NOTE — Telephone Encounter (Signed)
New message:    Patient calling stating he nee a order cardioversion. Please advise

## 2020-04-05 ENCOUNTER — Ambulatory Visit: Payer: Medicare HMO | Admitting: Nurse Practitioner

## 2020-04-07 ENCOUNTER — Other Ambulatory Visit: Payer: Self-pay | Admitting: Internal Medicine

## 2020-04-10 ENCOUNTER — Other Ambulatory Visit: Payer: Medicare HMO | Admitting: *Deleted

## 2020-04-10 ENCOUNTER — Other Ambulatory Visit (HOSPITAL_COMMUNITY)
Admission: RE | Admit: 2020-04-10 | Discharge: 2020-04-10 | Disposition: A | Discharge status: Home / Self Care | Payer: Medicare HMO | Source: Ambulatory Visit | Attending: Internal Medicine | Admitting: Internal Medicine

## 2020-04-10 DIAGNOSIS — I4892 Unspecified atrial flutter: Secondary | ICD-10-CM

## 2020-04-10 DIAGNOSIS — U071 COVID-19: Secondary | ICD-10-CM | POA: Insufficient documentation

## 2020-04-10 DIAGNOSIS — Z01812 Encounter for preprocedural laboratory examination: Secondary | ICD-10-CM | POA: Diagnosis not present

## 2020-04-10 LAB — BASIC METABOLIC PANEL
BUN/Creatinine Ratio: 17 (ref 10–24)
BUN: 15 mg/dL (ref 8–27)
CO2: 24 mmol/L (ref 20–29)
Calcium: 9.5 mg/dL (ref 8.6–10.2)
Chloride: 100 mmol/L (ref 96–106)
Creatinine, Ser: 0.86 mg/dL (ref 0.76–1.27)
GFR calc Af Amer: 104 mL/min/{1.73_m2} (ref >59)
GFR calc non Af Amer: 90 mL/min/{1.73_m2} (ref >59)
Glucose: 117 mg/dL — ABNORMAL HIGH (ref 65–99)
Potassium: 4.5 mmol/L (ref 3.5–5.2)
Sodium: 138 mmol/L (ref 134–144)

## 2020-04-10 LAB — CBC
Hematocrit: 42 % (ref 37.5–51.0)
Hemoglobin: 14.3 g/dL (ref 13.0–17.7)
MCH: 31.6 pg (ref 26.6–33.0)
MCHC: 34 g/dL (ref 31.5–35.7)
MCV: 93 fL (ref 79–97)
Platelets: 272 10*3/uL (ref 150–450)
RBC: 4.53 x10E6/uL (ref 4.14–5.80)
RDW: 11.4 % — ABNORMAL LOW (ref 11.6–15.4)
WBC: 5.9 10*3/uL (ref 3.4–10.8)

## 2020-04-10 LAB — SARS CORONAVIRUS 2 (TAT 6-24 HRS): SARS Coronavirus 2: POSITIVE — AB

## 2020-04-11 ENCOUNTER — Telehealth: Payer: Self-pay | Admitting: Internal Medicine

## 2020-04-11 NOTE — Telephone Encounter (Signed)
Lilia Pro from [facility] covid testing site is calling to report test results on this patient. Please advise.

## 2020-04-11 NOTE — Progress Notes (Signed)
Spoke with Altha Harm, RN at Dr. Oralia Rud office regarding pt's covid test results. MD will be notified.   Jacqlyn Larsen, RN

## 2020-04-11 NOTE — Telephone Encounter (Signed)
Per Lilia Pro pt has covid positive test  .Pt was to have DCCV on 04-12-20 Rescheduled procedure to 04/24/20 Pt aware ./cy

## 2020-04-14 ENCOUNTER — Other Ambulatory Visit: Payer: Self-pay | Admitting: Internal Medicine

## 2020-04-15 IMAGING — CT CT ABDOMEN AND PELVIS WITH CONTRAST
2 of 5 series · 16 of 46 positions shown, 18 images · IV contrast (OMNIPAQUE 300)
Comparison: None.

CLINICAL DATA: RIGHT lower quadrant pain for 5 days. Query
appendicitis.

EXAM:
CT ABDOMEN AND PELVIS WITH CONTRAST
TECHNIQUE: Multidetector CT imaging of the abdomen and pelvis was performed
using the standard protocol following bolus administration of
intravenous contrast.
CONTRAST:  100mL OMNIPAQUE IOHEXOL 240 MG/ML SOLN

[Series 3: abd/pel w · axial · 0.77mm/px · z∈[-369,+16]mm · 13 of 87 slices shown, 15 images]
[im 5/87  soft-tissue]
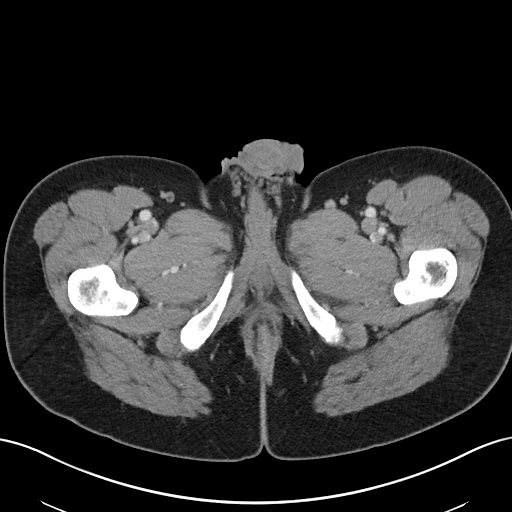
[im 5/87  bone]
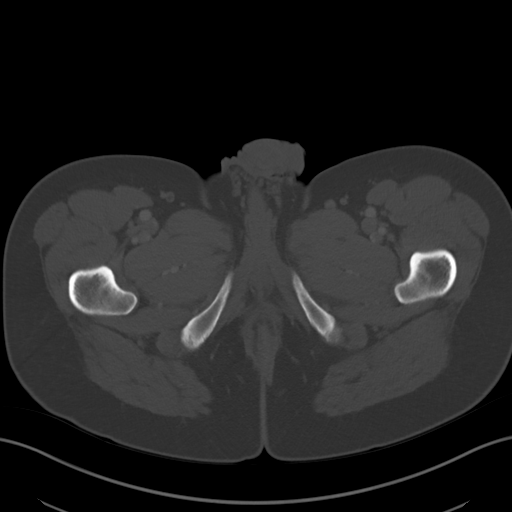
[im 14/87  soft-tissue]
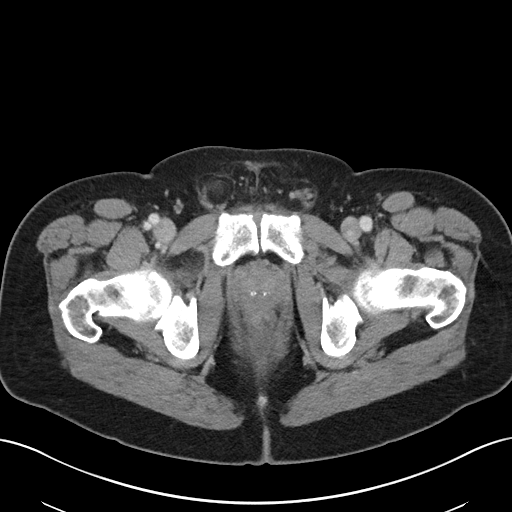
[im 19/87  soft-tissue]
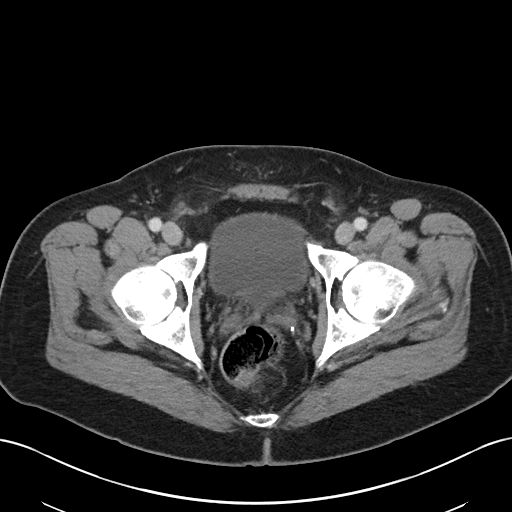
[im 23/87  soft-tissue]
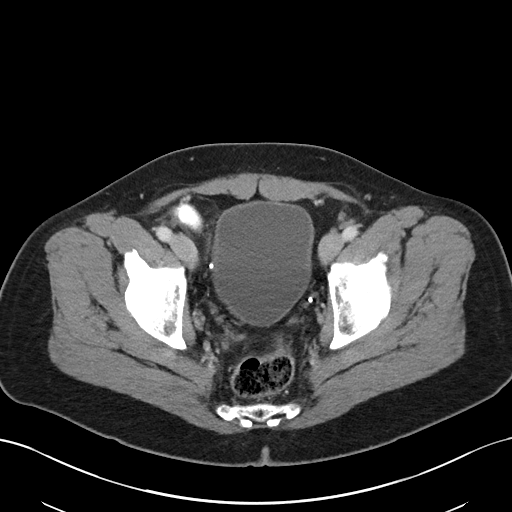
[im 32/87  soft-tissue]
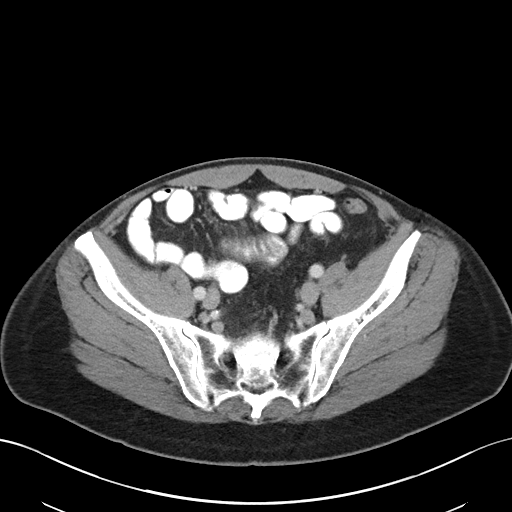
[im 37/87  soft-tissue]
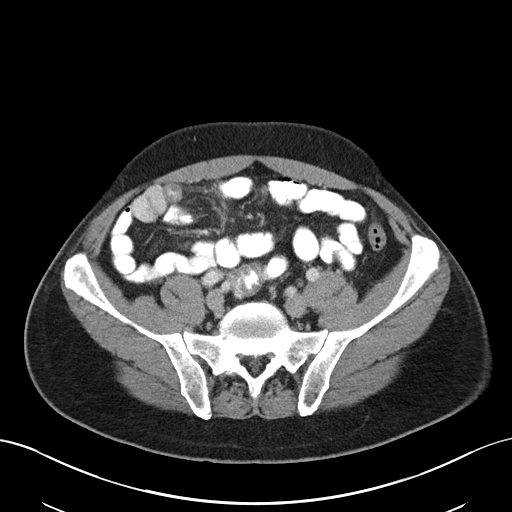
[im 46/87  soft-tissue]
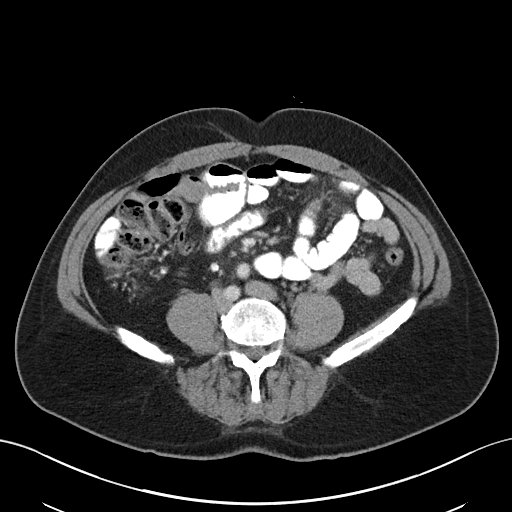
[im 50/87  soft-tissue]
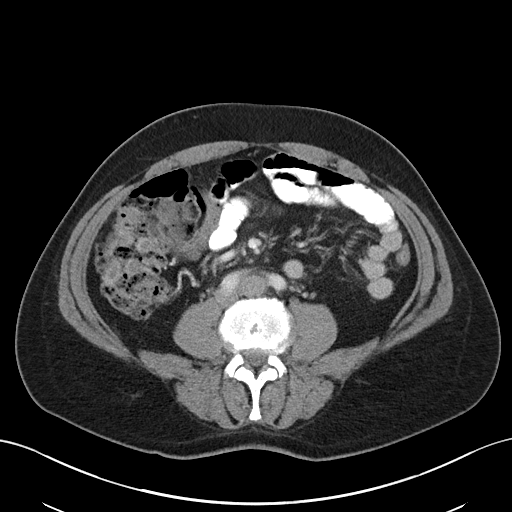
[im 55/87  soft-tissue]
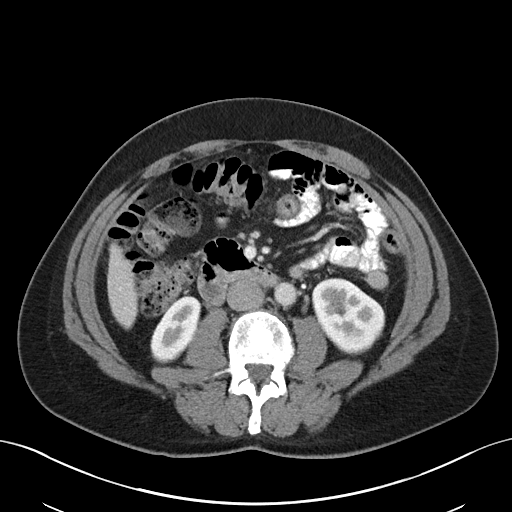
[im 55/87  bone]
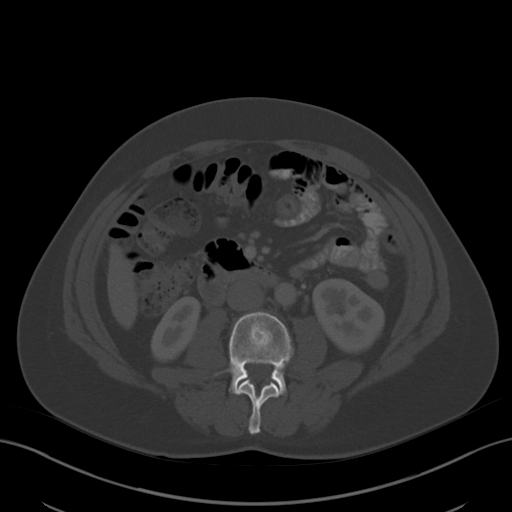
[im 64/87  soft-tissue]
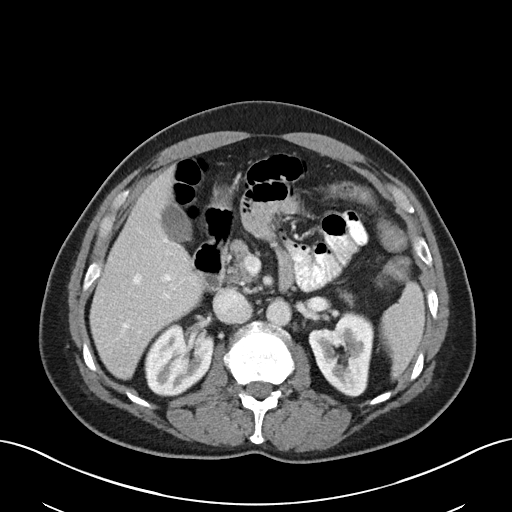
[im 68/87  soft-tissue]
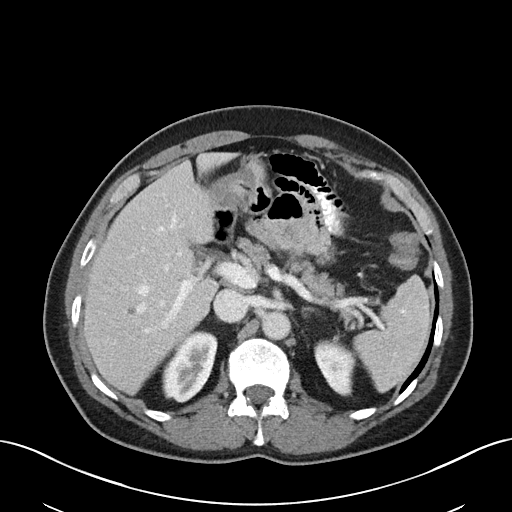
[im 73/87  soft-tissue]
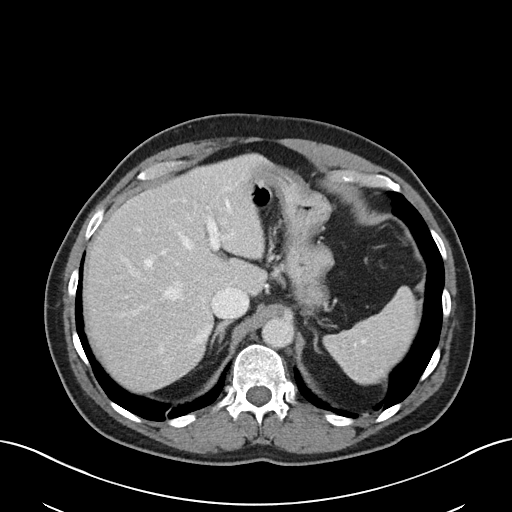
[im 82/87  soft-tissue]
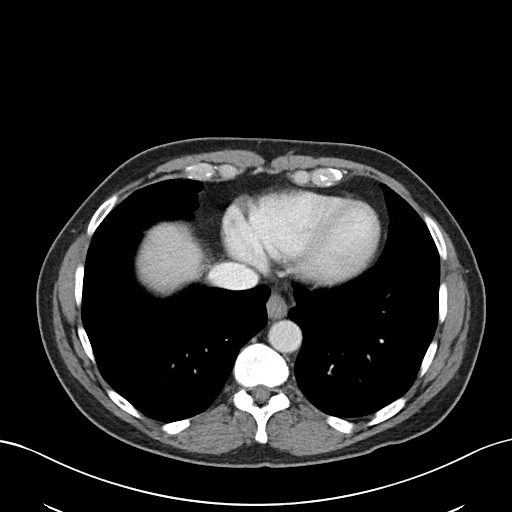

[Series 7: abd/pel w st · coronal · 0.64mm/px · 3 of 81 slices shown]
[im 27/81  soft-tissue]
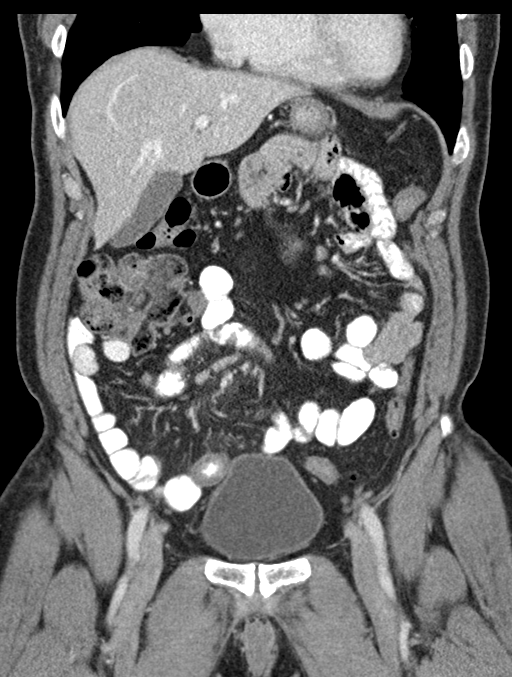
[im 36/81  soft-tissue]
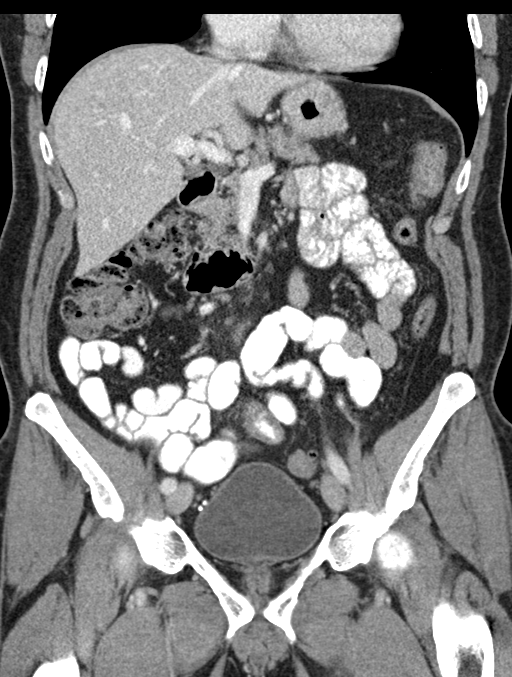
[im 45/81  soft-tissue]
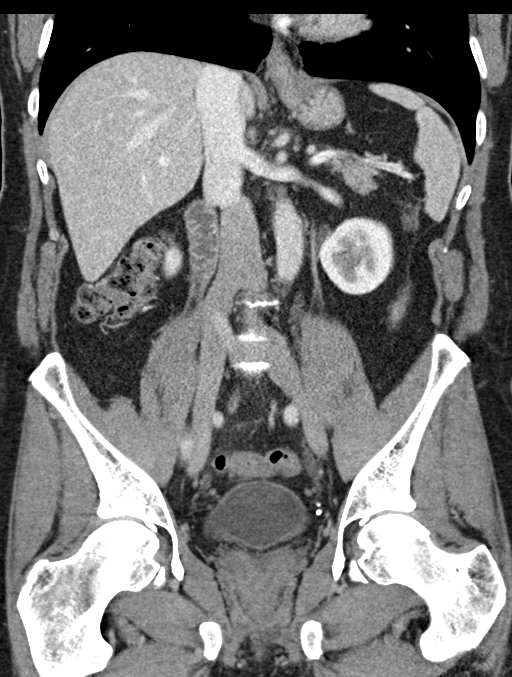

[16 of 46 positions shown; findings below may reference images not displayed]

FINDINGS: Lower chest: No acute abnormality.

Hepatobiliary: No focal liver abnormality is seen. No gallstones,
gallbladder wall thickening, or biliary dilatation.

Pancreas: Unremarkable. No pancreatic ductal dilatation or
surrounding inflammatory changes.

Spleen: Normal in size without focal abnormality.

Adrenals/Urinary Tract: Adrenal glands are unremarkable. Kidneys are
normal, without renal calculi, focal lesion, or hydronephrosis.
Bladder is unremarkable.

Stomach/Bowel:

Stomach is normal. Colon demonstrates moderate stool burden. Sigmoid
diverticulosis without changes of diverticulitis. Appendix is
normal. Terminal ileum is normal. There are a few thickened small
bowel loops distally, see for instance series 7, image 33, series 8,
image 54, or series 3, image 58. This most likely represents a
nonspecific infectious enteritis. Crohn's disease is less likely,
given the normal terminal ileum. Neoplasm is unlikely.

Vascular/Lymphatic: Aortic atherosclerosis. No enlarged abdominal or
pelvic lymph nodes.

Reproductive: Prostate is unremarkable.

Other: Fat containing RIGHT inguinal hernia.

Musculoskeletal: No acute or significant osseous findings. Schmorl's
nodes.
IMPRESSION: 1. A few thickened small bowel loops distally, most consistent with
a nonspecific infectious enteritis.
2. Normal appendix and terminal ileum.
3. Sigmoid diverticulosis without changes of diverticulitis.

Aortic Atherosclerosis ([id number]).

## 2020-04-24 ENCOUNTER — Other Ambulatory Visit: Payer: Self-pay

## 2020-04-24 ENCOUNTER — Ambulatory Visit (HOSPITAL_COMMUNITY): Payer: Medicare HMO | Admitting: Certified Registered Nurse Anesthetist

## 2020-04-24 ENCOUNTER — Ambulatory Visit (HOSPITAL_COMMUNITY)
Admission: RE | Admit: 2020-04-24 | Discharge: 2020-04-24 | Disposition: A | Discharge status: Home / Self Care | Payer: Medicare HMO | Attending: Internal Medicine | Admitting: Internal Medicine

## 2020-04-24 ENCOUNTER — Encounter (HOSPITAL_COMMUNITY)
Admission: RE | Disposition: A | Discharge status: Home / Self Care | Payer: Self-pay | Source: Home / Self Care | Attending: Internal Medicine

## 2020-04-24 ENCOUNTER — Encounter (HOSPITAL_COMMUNITY): Payer: Self-pay | Admitting: Internal Medicine

## 2020-04-24 DIAGNOSIS — I4892 Unspecified atrial flutter: Secondary | ICD-10-CM | POA: Insufficient documentation

## 2020-04-24 DIAGNOSIS — Z87891 Personal history of nicotine dependence: Secondary | ICD-10-CM | POA: Insufficient documentation

## 2020-04-24 DIAGNOSIS — I483 Typical atrial flutter: Secondary | ICD-10-CM

## 2020-04-24 DIAGNOSIS — I341 Nonrheumatic mitral (valve) prolapse: Secondary | ICD-10-CM | POA: Diagnosis not present

## 2020-04-24 DIAGNOSIS — E785 Hyperlipidemia, unspecified: Secondary | ICD-10-CM | POA: Diagnosis not present

## 2020-04-24 DIAGNOSIS — Z79899 Other long term (current) drug therapy: Secondary | ICD-10-CM | POA: Insufficient documentation

## 2020-04-24 DIAGNOSIS — Z7901 Long term (current) use of anticoagulants: Secondary | ICD-10-CM | POA: Diagnosis not present

## 2020-04-24 DIAGNOSIS — R69 Illness, unspecified: Secondary | ICD-10-CM | POA: Diagnosis not present

## 2020-04-24 HISTORY — PX: CARDIOVERSION: SHX1299

## 2020-04-24 SURGERY — CARDIOVERSION
Anesthesia: Monitor Anesthesia Care

## 2020-04-24 MED ORDER — SODIUM CHLORIDE 0.9 % IV SOLN
INTRAVENOUS | Status: AC | PRN
  Administered 2020-04-24: 500 mL via INTRAVENOUS

## 2020-04-24 MED ORDER — PROPOFOL 10 MG/ML IV BOLUS
INTRAVENOUS | Status: DC | PRN
  Administered 2020-04-24: 50 mg via INTRAVENOUS
  Administered 2020-04-24: 20 mg via INTRAVENOUS

## 2020-04-24 MED ORDER — LIDOCAINE 2% (20 MG/ML) 5 ML SYRINGE
INTRAMUSCULAR | Status: DC | PRN
  Administered 2020-04-24: 60 mg via INTRAVENOUS

## 2020-04-24 NOTE — H&P (Signed)
@LOGODEPT @ Cardiology H&P Note:    Date:  04/24/2020   ID:  Donald Wilkins, DOB 11-Oct-1950, MRN [id number]  PCP:  Binnie Rail, MD   [place]  Cardiologist:  Werner Lean, MD  Advanced Practice Provider:  No care team member to display Electrophysiologist:  None       Referring MD: No ref. provider found   Atrial fibrillation  History of Present Illness:    Donald Wilkins is a 70 y.o. male with a hx of  hx of Atrial Flutter, HLD, MVP seen 03/08/20.  Planned for DCCV; cancelled for viral illness in interim.  Echo showed LA dilation.  No chest pain or pressure .  No SOB/DOE and no PND/Orthopnea.  No weight gain or leg swelling.  No palpitations or syncope .  Still feel weaker than normal and doesn't have his full strength.   Past Medical History:  Diagnosis Date  . Basal cell cancer    X 2;[facility]  . Hyperlipidemia   . Incomplete right bundle branch block   . MVP (mitral valve prolapse)    on ECHO    Past Surgical History:  Procedure Laterality Date  . ANAL FISSURE REPAIR    . BASAL CELL CARCINOMA EXCISION  2011 & 2012   Mohs L face; back  . colonoscopy with polypectomy  2005    GI, Dr Deatra Ina; negative 2010    Current Medications: Current Meds  Medication Sig  . diclofenac sodium (VOLTAREN) 1 % GEL Apply 4 g topically 4 (four) times daily. (Patient taking differently: Apply 4 g topically 4 (four) times daily as needed (pain.).)  . ELIQUIS 5 MG TABS tablet TAKE 1 TABLET BY MOUTH TWICE A DAY  . finasteride (PROSCAR) 5 MG tablet TAKE 1/2 TABLET BY MOUTH EVERY 2 DAYS (Patient taking differently: Take 2.5 mg by mouth every other day.)  . FLUoxetine (PROZAC) 10 MG capsule TAKE 1 CAPSULE BY MOUTH EVERY DAY  . Glucosamine-Chondroitin (OSTEO BI-FLEX REGULAR STRENGTH PO) Take 2 tablets by mouth daily.  [name] ibuprofen (ADVIL) 200 MG tablet Take 400 mg by mouth every 8 (eight) hours as needed (pain.).  [name] metoprolol tartrate (LOPRESSOR) 25 MG  tablet TAKE 0.5 TABLETS BY MOUTH 2 TIMES DAILY. (Patient taking differently: Take 12.5 mg by mouth in the morning and at bedtime.)  . Multiple Vitamin (MULTIVITAMIN WITH MINERALS) TABS tablet Take 1 tablet by mouth daily. Centrum Silver  . Omega-3 Fatty Acids (SALMON OIL-1000 PO) Take 1,000 mg by mouth daily.  . sildenafil (REVATIO) 20 MG tablet Take 2-5 pills as needed (Patient taking differently: Take 40-100 mg by mouth daily as needed (erectile dysfunction.). Take 2-5 pills as needed)     Allergies:   Patient has no known allergies.   Social History   Socioeconomic History  . Marital status: Married    Spouse name: Not on file  . Number of children: Not on file  . Years of education: Not on file  . Highest education level: Not on file  Occupational History  . Not on file  Tobacco Use  . Smoking status: Former Smoker    Types: Cigarettes    Quit date: 03/05/1971    Years since quitting: 49.1  . Smokeless tobacco: Never Used  . Tobacco comment: smoked  1970-1973, up to 1/2 ppd  Substance and Sexual Activity  . Alcohol use: Yes    Comment: socially; 2-3 / week  . Drug use: No  . Sexual activity:  Not on file  Other Topics Concern  . Not on file  Social History Narrative   Exercise: walks, chops wood   Social Determinants of Health   Financial Resource Strain: Low Risk   . Difficulty of Paying Living Expenses: Not hard at all  Food Insecurity: No Food Insecurity  . Worried About Charity fundraiser in the Last Year: Never true  . Ran Out of Food in the Last Year: Never true  Transportation Needs: No Transportation Needs  . Lack of Transportation (Medical): No  . Lack of Transportation (Non-Medical): No  Physical Activity: Sufficiently Active  . Days of Exercise per Week: 5 days  . Minutes of Exercise per Session: 30 min  Stress: No Stress Concern Present  . Feeling of Stress : Not at all  Social Connections: Socially Integrated  . Frequency of Communication with Friends  and Family: More than three times a week  . Frequency of Social Gatherings with Friends and Family: More than three times a week  . Attends Religious Services: More than 4 times per year  . Active Member of Clubs or Organizations: Yes  . Attends Archivist Meetings: More than 4 times per year  . Marital Status: Married     Family History: The patient's family history includes Cancer in his brother; Diabetes in his father and sister; Heart attack in his maternal uncle; Heart attack (age of onset: 64) in his mother; Hypertension in his sister; Lung cancer in his father.  ROS:   Please see the history of present illness.     All other systems reviewed and are negative.  EKGs/Labs/Other Studies Reviewed:    The following studies were reviewed today:    Transthoracic Echocardiogram: Date: 03/27/20 Results: LA dilation mild MR 1. Left ventricular ejection fraction, by estimation, is 60 to 65%. The  left ventricle has normal function. The left ventricle has no regional  wall motion abnormalities. Left ventricular diastolic function could not  be evaluated.  2. Right ventricular systolic function is normal. The right ventricular  size is normal. There is normal pulmonary artery systolic pressure. The  estimated right ventricular systolic pressure is 81.1 mmHg.  3. Left atrial size was moderately dilated.  4. The mitral valve is normal in structure. Mild mitral valve  regurgitation. No evidence of mitral stenosis.  5. Tricuspid valve regurgitation is mild to moderate.  6. The aortic valve is normal in structure. Aortic valve regurgitation is  trivial. No aortic stenosis is present.  7. The inferior vena cava is normal in size with greater than 50%  respiratory variability, suggesting right atrial pressure of 3 mmHg.   Recent Labs: 02/17/2020: ALT 29; TSH 1.32 04/10/2020: BUN 15; Creatinine, Ser 0.86; Hemoglobin 14.3; Platelets 272; Potassium 4.5; Sodium 138  Recent  Lipid Panel    Component Value Date/Time   CHOL 177 03/22/2019 0843   TRIG 66.0 03/22/2019 0843   HDL 61.50 03/22/2019 0843   CHOLHDL 3 03/22/2019 0843   VLDL 13.2 03/22/2019 0843   LDLCALC 102 (H) 03/22/2019 0843     Risk Assessment/Calculations:    CHA2DS2-VASc Score = 1  This indicates a 0.6% annual risk of stroke. The patient's score is based upon: CHF History: No HTN History: No Diabetes History: No Stroke History: No Vascular Disease History: No Age Score: 1 Gender Score: 0      Physical Exam:    VS:  BP (!) 145/86   Pulse (!) 125   Temp (!) 97.2 F (  36.2 C) (Temporal)   Resp 14   Ht 5\' 10"  (1.778 m)   Wt 86.2 kg   SpO2 98%   BMI 27.26 kg/m     Wt Readings from Last 3 Encounters:  04/24/20 86.2 kg  03/08/20 90.7 kg  02/17/20 90.3 kg     GEN:  Well nourished, well developed in no acute distress HEENT: Normal NECK: No JVD; No carotid bruits LYMPHATICS: No lymphadenopathy CARDIAC: irregularly irregular tachycardia, no murmurs, rubs, gallops RESPIRATORY:  Clear to auscultation without rales, wheezing or rhonchi  ABDOMEN: Soft, non-tender, non-distended MUSCULOSKELETAL:  No edema; No deformity  SKIN: Warm and dry NEUROLOGIC:  Alert and oriented x 3 PSYCHIATRIC:  Normal affect   ASSESSMENT:    No diagnosis found. PLAN:    In order of problems listed above:  Mild Mitral Regurgitation Atrial Flutter- symptomatic - Presenting today for DCCV   Medication Adjustments/Labs and Tests Ordered: Current medicines are reviewed at length with the patient today.  Concerns regarding medicines are outlined above.  Orders Placed This Encounter  Procedures  . Informed Consent Details: Physician/Practitioner Attestation; Transcribe to consent form and obtain patient signature  . If patient is taking diuretic or labs are older than 2 days, place order for and obtain BMET  . If patient is on Coumadin, place order for and obtain PT-INR and document INR is  between 2-3  . If patient is on rivaroxaban [facility]), verify that patient currently has taken 3 consecutive doses  . If patient is on dabigatran (PRADAXA) or apixaban [facility]), verify that patient currently has taken 5 consecutive doses  . Verify informed consent  . If patient is a diabetic, place order for and obtain CBG  . Document by rhythm strip atrial fibrillation or atrial flutter. If patient is in NSR, place order for and obtain EKG, call MD  . Remove and safely store all jewelry.  Tape rings in place that cannot be removed.  . Shave left side of chest and back if necessary for pad placement  . Patient to wear a single hospital gown - Ask patient to remove dentures, if any  . Provide equipment / supplies at bedside  . EKG 12-Lead pre-cardioversion  . Insert peripheral IV   Meds ordered this encounter  Medications  . 0.9 %  sodium chloride infusion    There are no outpatient Patient Instructions on file for this admission.   Signed, Werner Lean, MD  04/24/2020 11:03 AM    [place]

## 2020-04-24 NOTE — Anesthesia Preprocedure Evaluation (Signed)
Anesthesia Evaluation  Patient identified by MRN, date of birth, ID band Patient awake    Reviewed: Allergy & Precautions, NPO status , Patient's Chart, lab work & pertinent test results  Airway Mallampati: II       Dental  (+) Teeth Intact   Pulmonary former smoker,    breath sounds clear to auscultation       Cardiovascular  Rhythm:Irregular Rate:Normal     Neuro/Psych    GI/Hepatic   Endo/Other    Renal/GU      Musculoskeletal   Abdominal   Peds  Hematology   Anesthesia Other Findings   Reproductive/Obstetrics                             Anesthesia Physical Anesthesia Plan  ASA: III  Anesthesia Plan: General   Post-op Pain Management:    Induction: Intravenous  PONV Risk Score and Plan:   Airway Management Planned: Mask  Additional Equipment:   Intra-op Plan:   Post-operative Plan:   Informed Consent: I have reviewed the patients History and Physical, chart, labs and discussed the procedure including the risks, benefits and alternatives for the proposed anesthesia with the patient or authorized representative who has indicated his/her understanding and acceptance.       Plan Discussed with: CRNA and Anesthesiologist  Anesthesia Plan Comments:         Anesthesia Quick Evaluation

## 2020-04-24 NOTE — Transfer of Care (Signed)
Immediate Anesthesia Transfer of Care Note  Patient: Donald Wilkins  Procedure(s) Performed: CARDIOVERSION (N/A )  Patient Location: Endoscopy Unit  Anesthesia Type:General  Level of Consciousness: drowsy and patient cooperative  Airway & Oxygen Therapy: Patient Spontanous Breathing  Post-op Assessment: Report given to RN, Post -op Vital signs reviewed and stable and Patient moving all extremities X 4  Post vital signs: Reviewed and stable  Last Vitals:  Vitals Value Taken Time  BP    Temp    Pulse    Resp    SpO2      Last Pain:  Vitals:   04/24/20 1010  TempSrc: Temporal  PainSc: 0-No pain         Complications: No complications documented.

## 2020-04-24 NOTE — Anesthesia Postprocedure Evaluation (Signed)
Anesthesia Post Note  Patient: Donald Wilkins  Procedure(s) Performed: CARDIOVERSION (N/A )     Patient location during evaluation: Endoscopy Anesthesia Type: General Level of consciousness: awake and alert Pain management: pain level controlled Vital Signs Assessment: post-procedure vital signs reviewed and stable Respiratory status: spontaneous breathing, nonlabored ventilation, respiratory function stable and patient connected to nasal cannula oxygen Cardiovascular status: blood pressure returned to baseline and stable Postop Assessment: no apparent nausea or vomiting Anesthetic complications: no   No complications documented.  Last Vitals:  Vitals:   04/24/20 1135 04/24/20 1145  BP: 92/74 119/73  Pulse: (!) 38 (!) 57  Resp: 15 19  Temp:    SpO2: 98% 99%    Last Pain:  Vitals:   04/24/20 1145  TempSrc:   PainSc: 0-No pain                 Towanna Avery COKER

## 2020-04-24 NOTE — Discharge Instructions (Signed)
Electrical Cardioversion Electrical cardioversion is the delivery of a jolt of electricity to restore a normal rhythm to the heart. A rhythm that is too fast or is not regular keeps the heart from pumping well. In this procedure, sticky patches or metal paddles are placed on the chest to deliver electricity to the heart from a device. This procedure may be done in an emergency if:  There is low or no blood pressure as a result of the heart rhythm.  Normal rhythm must be restored as fast as possible to protect the brain and heart from further damage.  It may save a life. This may also be a scheduled procedure for irregular or fast heart rhythms that are not immediately life-threatening. Tell a health care provider about:  Any allergies you have.  All medicines you are taking, including vitamins, herbs, eye drops, creams, and over-the-counter medicines.  Any problems you or family members have had with anesthetic medicines.  Any blood disorders you have.  Any surgeries you have had.  Any medical conditions you have.  Whether you are pregnant or may be pregnant. What are the risks? Generally, this is a safe procedure. However, problems may occur, including:  Allergic reactions to medicines.  A blood clot that breaks free and travels to other parts of your body.  The possible return of an abnormal heart rhythm within hours or days after the procedure.  Your heart stopping (cardiac arrest). This is rare. What happens before the procedure? Medicines  Your health care provider may have you start taking: ? Blood-thinning medicines (anticoagulants) so your blood does not clot as easily. ? Medicines to help stabilize your heart rate and rhythm.  Ask your health care provider about: ? Changing or stopping your regular medicines. This is especially important if you are taking diabetes medicines or blood thinners. ? Taking medicines such as aspirin and ibuprofen. These medicines can  thin your blood. Do not take these medicines unless your health care provider tells you to take them. ? Taking over-the-counter medicines, vitamins, herbs, and supplements. General instructions  Follow instructions from your health care provider about eating or drinking restrictions.  Plan to have someone take you home from the hospital or clinic.  If you will be going home right after the procedure, plan to have someone with you for 24 hours.  Ask your health care provider what steps will be taken to help prevent infection. These may include washing your skin with a germ-killing soap. What happens during the procedure?  An IV will be inserted into one of your veins.  Sticky patches (electrodes) or metal paddles may be placed on your chest.  You will be given a medicine to help you relax (sedative).  An electrical shock will be delivered. The procedure may vary among health care providers and hospitals.   What can I expect after the procedure?  Your blood pressure, heart rate, breathing rate, and blood oxygen level will be monitored until you leave the hospital or clinic.  Your heart rhythm will be watched to make sure it does not change.  You may have some redness on the skin where the shocks were given. Follow these instructions at home:  Do not drive for 24 hours if you were given a sedative during your procedure.  Take over-the-counter and prescription medicines only as told by your health care provider.  Ask your health care provider how to check your pulse. Check it often.  Rest for 48 hours after the procedure   or as told by your health care provider.  Avoid or limit your caffeine use as told by your health care provider.  Keep all follow-up visits as told by your health care provider. This is important. Contact a health care provider if:  You feel like your heart is beating too quickly or your pulse is not regular.  You have a serious muscle cramp that does not go  away. Get help right away if:  You have discomfort in your chest.  You are dizzy or you feel faint.  You have trouble breathing or you are short of breath.  Your speech is slurred.  You have trouble moving an arm or leg on one side of your body.  Your fingers or toes turn cold or blue. Summary  Electrical cardioversion is the delivery of a jolt of electricity to restore a normal rhythm to the heart.  This procedure may be done right away in an emergency or may be a scheduled procedure if the condition is not an emergency.  Generally, this is a safe procedure.  After the procedure, check your pulse often as told by your health care provider. This information is not intended to replace advice given to you by your health care provider. Make sure you discuss any questions you have with your health care provider. Document Revised: 09/21/2018 Document Reviewed: 09/21/2018 Elsevier Patient Education  2021 [employer]  

## 2020-04-24 NOTE — CV Procedure (Signed)
   Electrical Cardioversion Procedure Note GLEASON ARDOIN [id number] [birth year]  Procedure: Electrical Cardioversion Indications:  Atrial Flutter  Time Out: Verified patient identification, verified procedure,medications/allergies/relevent history reviewed, required imaging and test results available.  Performed  Procedure Details  The patient was NPO after midnight. Anesthesia was administered at the beside  by Dr.Turk with 60mg  of lidocaine and 70 mg propofol.  Cardioversion was done with synchronized biphasic defibrillation with AP pads with 200 Joules.  The patient converted to normal sinus rhythm. The patient tolerated the procedure well   IMPRESSION:  Successful cardioversion of atrial flutter  Per patients wishes, discussed results with his wife, Benjamine Mola.  Calix Heinbaugh A Shakerra Red 04/24/2020, 11:21 AM

## 2020-04-25 ENCOUNTER — Encounter (HOSPITAL_COMMUNITY): Payer: Self-pay | Admitting: Internal Medicine

## 2020-04-26 ENCOUNTER — Ambulatory Visit: Payer: Medicare HMO | Admitting: Nurse Practitioner

## 2020-04-26 ENCOUNTER — Encounter: Payer: Self-pay | Admitting: Nurse Practitioner

## 2020-04-26 ENCOUNTER — Other Ambulatory Visit (INDEPENDENT_AMBULATORY_CARE_PROVIDER_SITE_OTHER): Payer: Medicare HMO

## 2020-04-26 ENCOUNTER — Other Ambulatory Visit: Payer: Self-pay

## 2020-04-26 VITALS — BP 118/76 | HR 90 | Ht 70.0 in | Wt 203.0 lb

## 2020-04-26 DIAGNOSIS — R933 Abnormal findings on diagnostic imaging of other parts of digestive tract: Secondary | ICD-10-CM | POA: Diagnosis not present

## 2020-04-26 DIAGNOSIS — R1030 Lower abdominal pain, unspecified: Secondary | ICD-10-CM

## 2020-04-26 LAB — C-REACTIVE PROTEIN: CRP: <1 mg/dL (ref 0.5–20.0)

## 2020-04-26 LAB — SEDIMENTATION RATE: Sed Rate: 10 mm/hr (ref 0–20)

## 2020-04-26 NOTE — Patient Instructions (Signed)
If you are age 68 or older, your body mass index should be between 23-30. Your Body mass index is 29.13 kg/m. If this is out of the aforementioned range listed, please consider follow up with your Primary Care Provider.  If you are age 22 or younger, your body mass index should be between 19-25. Your Body mass index is 29.13 kg/m. If this is out of the aformentioned range listed, please consider follow up with your Primary Care Provider.   Your provider has requested that you go to the basement level for lab work before leaving today. Press "B" on the elevator. The lab is located at the first door on the left as you exit the elevator.  Due to recent changes in healthcare laws, you may see the results of your imaging and laboratory studies on MyChart before your provider has had a chance to review them.  We understand that in some cases there may be results that are confusing or concerning to you. Not all laboratory results come back in the same time frame and the provider may be waiting for multiple results in order to interpret others.  Please give [place] 48 hours in order for your provider to thoroughly review all the results before contacting the office for clarification of your results.   We will call you with your lab results and discuss any follow up.  Thank you for entrusting me with your care and choosing [facility].  Tye Savoy, NP-C

## 2020-04-26 NOTE — Progress Notes (Signed)
   ASSESSMENT AND PLAN    # 70 yo male with episodic lower abdominal pain. Two episodes since May 2020 when CT scan suggested distal small bowel enteritis. Second episode in Oct 2021. Both episodes responded to antibiotics. Etiology unclear, infectious etiologie doesn't seem to fit, especially in absence of diarrhea or other bowel changes.  IBD is possible but it also doesn't fit well with clinical picture.    --Recent CBC normal  --Obtain CRP, ESR --Will discuss with Dr. Ambruster. Not sure the area of concern in distal small bowel is reachable by colonoscopy with TI intubation. May need enterography or capsule? Since he is asymptomatic now we can hopefully wait until he is off Eliquis.            # Aflutter, diagnosed in mid December, s/p cardioversion last Monday. Currently on Eliquis. Has follow up with Cardiology April 8th  # Hx of adenomatous colon polyps ( 5 mm and 1 cm) in 2010. Last colonoscopy in 2015 was normal.   HISTORY OF PRESENT ILLNESS     Primary Gastroenterologist : Steven Armbruster, MD  Chief Complaint : episodic abdominal pain   Donald Wilkins is a 70 y.o. male with PMH / PSH significant for COVID19 Feb 2022, Atrial flutter adenomatous colon polyps  Episode May 2020 started having lower abdominal during the night lasting about 30 minutes. No nausea, vomiting or diarrhea. No fevers or blood in stools. After a few night of these episodes PCP ordered CT scan showing enteritis. Symptoms resolved after a course of antibiotics. He did fine until around October 2021 he developed the same pain but not quite as bad. Again the pain lasted for several days and only occurred around 2 am. Took another course of antibiotics and symptoms resolved and he has had no symptoms since. No associated urinary symptoms. Weight is up over the last couple of years.    Data Reviewed:  Feb 2022 --CBC ok --renal function okay  May 2020 CT scan w/ contrast IMPRESSION: 1. A few thickened small  bowel loops distally, most consistent with a nonspecific infectious enteritis. 2. Normal appendix and terminal ileum. 3. Sigmoid diverticulosis without changes of diverticulitis  Previous Endoscopic Evaluations / Pertinent Studies:  Oct 2015 polyp surveillance colonoscopy  --normal.  Past Medical History:  Diagnosis Date  . A-fib (HCC)   . Basal cell cancer    X 2;[facility]  . Hyperlipidemia   . Incomplete right bundle branch block   . MVP (mitral valve prolapse)    on ECHO     Past Surgical History:  Procedure Laterality Date  . ANAL FISSURE REPAIR    . BASAL CELL CARCINOMA EXCISION  2011 & 2012   Mohs L face; back  . CARDIOVERSION N/A 04/24/2020   Procedure: CARDIOVERSION;  Surgeon: Chandrasekhar, Mahesh A, MD;  Location: [facility] END[facility];  Service: Cardiovascular;  Laterality: N/A;  . colonoscopy with polypectomy  2005   [facility], Dr Kaplan; negative 2010   Family History  Problem Relation Age of Onset  . Heart attack Mother 65  . Diabetes Father   . Lung cancer Father        smoker  . Heart attack Maternal Uncle        pre 55  . Cancer Brother        malignant,brain stem  . Diabetes Sister   . Hypertension Sister   . Stomach cancer Neg Hx   . Colon cancer Neg Hx   . Esophageal cancer   Neg Hx   . Pancreatic cancer Neg Hx    Social History   Tobacco Use  . Smoking status: Former Smoker    Types: Cigarettes    Quit date: 03/05/1971    Years since quitting: 49.1  . Smokeless tobacco: Never Used  . Tobacco comment: smoked  1970-1973, up to 1/2 ppd  Vaping Use  . Vaping Use: Never used  Substance Use Topics  . Alcohol use: Yes    Comment: socially; 2-3 / week  . Drug use: No   Current Outpatient Medications  Medication Sig Dispense Refill  . diclofenac sodium (VOLTAREN) 1 % GEL Apply 4 g topically 4 (four) times daily. (Patient taking differently: Apply 4 g topically 4 (four) times daily as needed (pain.).) 100 g 5  . ELIQUIS 5 MG TABS tablet TAKE 1 TABLET BY  MOUTH TWICE A DAY 60 tablet 1  . finasteride (PROSCAR) 5 MG tablet TAKE 1/2 TABLET BY MOUTH EVERY 2 DAYS (Patient taking differently: Take 2.5 mg by mouth every other day.) 90 tablet 3  . FLUoxetine (PROZAC) 10 MG capsule TAKE 1 CAPSULE BY MOUTH EVERY DAY 90 capsule 3  . Glucosamine-Chondroitin (OSTEO BI-FLEX REGULAR STRENGTH PO) Take 2 tablets by mouth daily.    . ibuprofen (ADVIL) 200 MG tablet Take 400 mg by mouth every 8 (eight) hours as needed (pain.).    . metoprolol tartrate (LOPRESSOR) 25 MG tablet TAKE 0.5 TABLETS BY MOUTH 2 TIMES DAILY. (Patient taking differently: Take 12.5 mg by mouth in the morning and at bedtime.) 90 tablet 1  . Multiple Vitamin (MULTIVITAMIN WITH MINERALS) TABS tablet Take 1 tablet by mouth daily. Centrum Silver    . Omega-3 Fatty Acids (SALMON OIL-1000 PO) Take 1,000 mg by mouth daily.    . sildenafil (REVATIO) 20 MG tablet Take 2-5 pills as needed (Patient taking differently: Take 40-100 mg by mouth daily as needed (erectile dysfunction.). Take 2-5 pills as needed) 40 tablet 5   No current facility-administered medications for this visit.   No Known Allergies   Review of Systems: All other systems reviewed and negative except where noted in HPI.   PHYSICAL EXAM :    Wt Readings from Last 3 Encounters:  04/26/20 203 lb (92.1 kg)  04/24/20 190 lb (86.2 kg)  03/08/20 200 lb (90.7 kg)    BP 118/76   Pulse 90   Ht 5' 10" (1.778 m)   Wt 203 lb (92.1 kg)   BMI 29.13 kg/m  Constitutional:  Pleasant male in no acute distress. Psychiatric: Normal mood and affect. Behavior is normal. EENT: Pupils normal.  Conjunctivae are normal. No scleral icterus. Neck supple.  Cardiovascular: Normal rate.  No edema Pulmonary/chest: Effort normal and breath sounds normal. No wheezing, rales or rhonchi. Abdominal: Soft, nondistended, nontender. Bowel sounds active throughout. There are no masses palpable. No hepatomegaly. Neurological: Alert and oriented to person place  and time. Skin: Skin is warm and dry. No rashes noted.  Paula Guenther, NP  04/26/2020, 9:47 AM  Cc:  Referring Provider Burns, Stacy J, MD        

## 2020-04-27 NOTE — Progress Notes (Signed)
Agree with assessment as outlined with the following thoughts: Agree that infectious etiology for his symptoms does not fit if primarily just abdominal pain without change in bowel habits. With his prior CT findings, I would recommend CT enterography study if he wishes to have this further evaluated. Also would provide him some bentyl to use PRN if he has recurrent pain and see if that helps. thanks

## 2020-05-03 ENCOUNTER — Other Ambulatory Visit: Payer: Self-pay

## 2020-05-03 DIAGNOSIS — R933 Abnormal findings on diagnostic imaging of other parts of digestive tract: Secondary | ICD-10-CM

## 2020-05-03 DIAGNOSIS — R1030 Lower abdominal pain, unspecified: Secondary | ICD-10-CM

## 2020-05-16 ENCOUNTER — Ambulatory Visit: Payer: Medicare HMO | Admitting: Sports Medicine

## 2020-05-17 ENCOUNTER — Ambulatory Visit: Payer: Self-pay

## 2020-05-17 ENCOUNTER — Ambulatory Visit: Payer: Medicare HMO | Admitting: Family Medicine

## 2020-05-17 ENCOUNTER — Other Ambulatory Visit: Payer: Self-pay

## 2020-05-17 VITALS — BP 120/84 | Ht 70.0 in | Wt 195.0 lb

## 2020-05-17 DIAGNOSIS — M25511 Pain in right shoulder: Secondary | ICD-10-CM | POA: Diagnosis not present

## 2020-05-17 MED ORDER — METHYLPREDNISOLONE ACETATE 40 MG/ML IJ SUSP
40.0000 mg | Freq: Once | INTRAMUSCULAR | Status: AC
  Administered 2020-05-17: 40 mg via INTRA_ARTICULAR

## 2020-05-17 NOTE — Assessment & Plan Note (Signed)
Patient with evidence of acute subacromial bursitis of the right shoulder with evidence of chronic changes to the right shoulder such as a proximal biceps tendon rupture which appeared to be scarred down distally to the bicipital groove.  He also had some nonspecific changes to the subscap tendon with areas of hypoechoic changes that were not consistent with clear tearing but like chronic changes. -Subacromial injection as noted below - HEP with resistance bands - He can take Tylenol and or ibuprofen as needed - Follow-up in 4 weeks; if no improvement I would consider getting MRI due to the nonspecific changes seen in the subscap on ultrasound.

## 2020-05-17 NOTE — Progress Notes (Addendum)
SUBJECTIVE:   CHIEF COMPLAINT / HPI:   Right Shoulder Pain [name] is a very pleasant 70y/o male who is a new patient to this practice presenting today for right shoulder pain.  His right shoulder pain began approximately 6 to 8 weeks ago when he fell on the ice.  He did have some pain initially but not did not notice any significant bruising or swelling of the right shoulder or upper arm.  It did seem to get better with time but he still has some residual pain with specific activities such as overhead activity or prolonged drumming.  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  He has had right shoulder pain in the past and does think he probably injured it a long time ago with overhead weightlifting but never got it looked at.  He is having difficulty sleeping and can no longer lay on the right side due to the pain in the right shoulder.  It states mainly on the posterior lateral aspect of the right shoulder with nothing radiating down the arm or coming from the neck.  He can still do most activities such as overhead activities but they are uncomfortable.  PERTINENT  PMH / PSH: Hx of Atrial Flutter s/p cardioversion,   OBJECTIVE:   BP 120/84   Ht 5\' 10"  (1.778 m)   Wt 195 lb (88.5 kg)   BMI 27.98 kg/m   No flowsheet data found.  Shoulder, Right: No evidence of bony deformity, asymmetry, or muscle atrophy; No tenderness over long head of biceps (bicipital groove). No TTP at [facility] joint. Full active and passive range of motion but pain with abduction and internal rotation, Thumb to T12 with mild  tenderness. Strength 5/5 throughout. Sensation intact. Peripheral pulses intact. Special Tests:   - Crossarm test: NEG - Jobe test: NEG for weakness but painful   - Hawkins: Positive   - Neer test: Positive   - Gerber lift-off test: Equivical   - Belly press test: NEG   - Drop arm test: NEG  MSK Complete U/S: Right  Shoulder: -Biceps tendon: No proximal biceps tendon in the bicipital groove. -Pectoralis: Insertion visualized and without abnormalities. -Subscapularis: Well visualized to insertion point on humerus.  No abnormalities. Hypoechoic fluid accumulation seen superior to the supscap tendon. Some hypoechoic changes seen within the subscap tendon.  -AC joint: No osteophytes and some/no fluid collection, no significant separation -Supraspinatus: No areas of calcification in the anterior fibers distal insertion of the supraspinatus.  Hypoechoic fluid accumulation superiorly to tendon. Dynamic testing did not reveal signs of impingement. -Subacromial bursa: Positive for bursal swelling -Infraspinatus/teres minor: Insertion point on posterior humerus visualized and without abnormalities. 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    ASSESSMENT/PLAN:   Right shoulder pain Patient with evidence of acute subacromial bursitis of the right shoulder with evidence of chronic changes to the right shoulder such as a proximal biceps tendon rupture which appeared to be scarred down distally to the bicipital groove.  He also had some nonspecific changes to the subscap tendon with areas of hypoechoic changes that were not consistent with clear tearing but like chronic changes. -Subacromial injection as noted below - HEP with resistance bands - He can take Tylenol and or ibuprofen as needed - Follow-up in 4 weeks; if no improvement I would consider getting MRI due to  the nonspecific changes seen in the subscap on ultrasound.   Written and verbal consent was obtained after discussing the risks and benefits of the procedure with the patient. A timeout was performed and the correct site and side was identified and confirmed.  The right shoulder was cleaned in sterile fashion betadine and alcohol pad. 1cc 40 mg Depo-medrol and 3 cc 1% Lidocaine  was injected into the right subacromial space using a posteriolateral approach using a 5 cc syringe and 25 gauge 1 and 1/2 in needle. No complications were encountered. Minimal blood loss. A band aid was applied.    Donald Alpha, DO PGY-4, Sports Medicine Fellow [place]  Addendum:  I was the preceptor for this visit and available for immediate consultation.  Donald Lemon MD Donald Wilkins

## 2020-05-17 NOTE — Patient Instructions (Signed)
It was great to meet you today! Thank you for letting me participate in your care!  Today, we discussed your right shoulder pain which is due to bursitis in the shoulder. The injection you got today should significantly improve your symptoms. Please do the exercises I gave you 2-3 times per week. The exercises should NOT cause pain. You can take Alleve and/or Tylenol as needed.   Please return in 4 weeks for reassessment.  Be well, Donald Rutherford, DO PGY-4, Sports Medicine Fellow [place]

## 2020-06-05 ENCOUNTER — Ambulatory Visit: Payer: Medicare HMO

## 2020-06-07 ENCOUNTER — Other Ambulatory Visit: Payer: Self-pay | Admitting: Internal Medicine

## 2020-06-09 ENCOUNTER — Other Ambulatory Visit: Payer: Self-pay

## 2020-06-09 ENCOUNTER — Ambulatory Visit: Payer: Medicare HMO | Admitting: Internal Medicine

## 2020-06-09 ENCOUNTER — Encounter: Payer: Self-pay | Admitting: Internal Medicine

## 2020-06-09 VITALS — BP 118/68 | HR 76 | Ht 70.0 in | Wt 190.2 lb

## 2020-06-09 DIAGNOSIS — I4892 Unspecified atrial flutter: Secondary | ICD-10-CM

## 2020-06-09 DIAGNOSIS — M25511 Pain in right shoulder: Secondary | ICD-10-CM

## 2020-06-09 DIAGNOSIS — E782 Mixed hyperlipidemia: Secondary | ICD-10-CM | POA: Diagnosis not present

## 2020-06-09 NOTE — Patient Instructions (Signed)
Medication Instructions:  Your physician recommends that you continue on your current medications as directed. Please refer to the Current Medication list given to you today.  *If you need a refill on your cardiac medications before your next appointment, please call your pharmacy*   Lab Work: NONE If you have labs (blood work) drawn today and your tests are completely normal, you will receive your results only by: [name] MyChart Message (if you have MyChart) OR . A paper copy in the mail If you have any lab test that is abnormal or we need to change your treatment, we will call you to review the results.   Testing/Procedures: NONE   Follow-Up: At [facility], you and your health needs are our priority.  As part of our continuing mission to provide you with exceptional heart care, we have created designated Provider Care Teams.  These Care Teams include your primary Cardiologist (physician) and Advanced Practice Providers (APPs -  Physician Assistants and Nurse Practitioners) who all work together to provide you with the care you need, when you need it.  We recommend signing up for the patient portal called "MyChart".  Sign up information is provided on this After Visit Summary.  MyChart is used to connect with patients for Virtual Visits (Telemedicine).  Patients are able to view lab/test results, encounter notes, upcoming appointments, etc.  Non-urgent messages can be sent to your provider as well.   To learn more about what you can do with MyChart, go to [email or web address].    Your next appointment:   7-8 month(s)  The format for your next appointment:   In Person  Provider:   You may see Werner Lean, MD or one of the following Advanced Practice Providers on your designated Care Team:    Melina Copa, PA-C  Ermalinda Barrios, PA-C

## 2020-06-09 NOTE — Progress Notes (Signed)
Cardiology Office Note:    Date:  06/09/2020   ID:  Donald Wilkins, DOB 19-Oct-1950, MRN [id number]  PCP:  Binnie Rail, MD  [facility] Cardiologist:  Werner Lean, MD  [place] Electrophysiologist:  None   CC: Feeling better out of AF  History of Present Illness:    Donald Wilkins is a 70 y.o. male with a hx of Atrial Flutter, HLD, MVP who presents for evaluation 03/08/20.  In interim of this visit, patient had successful DCCV 04/24/20.  Patient notes that he is feeling back to his old self.  Since day last visit notes not feeling winded.  Relevant interval testing or therapy include .  There are no interval hospital/ED visit.    No chest pain or pressure .  No SOB/DOE and no PND/Orthopnea.  No weight gain or leg swelling.  No palpitations or syncope.  No issues with gigs.   Past Medical History:  Diagnosis Date  . A-fib ([place])   . Basal cell cancer    X 2;[facility]  . Hyperlipidemia   . Incomplete right bundle branch block   . MVP (mitral valve prolapse)    on ECHO    Past Surgical History:  Procedure Laterality Date  . ANAL FISSURE REPAIR    . BASAL CELL CARCINOMA EXCISION  2011 & 2012   Mohs L face; back  . CARDIOVERSION N/A 04/24/2020   Procedure: CARDIOVERSION;  Surgeon: Werner Lean, MD;  Location: [place];  Service: Cardiovascular;  Laterality: N/A;  . colonoscopy with polypectomy  2005   [facility], Dr Deatra Ina; negative 2010    Current Medications: Current Meds  Medication Sig  . diclofenac sodium (VOLTAREN) 1 % GEL Apply 4 g topically 4 (four) times daily.  [name] ELIQUIS 5 MG TABS tablet TAKE 1 TABLET BY MOUTH TWICE A DAY  . finasteride (PROSCAR) 5 MG tablet TAKE 1/2 TABLET BY MOUTH EVERY 2 DAYS  . FLUoxetine (PROZAC) 10 MG capsule TAKE 1 CAPSULE BY MOUTH EVERY DAY  . Glucosamine-Chondroitin (OSTEO BI-FLEX REGULAR STRENGTH PO) Take 2 tablets by mouth daily.  [name] ibuprofen (ADVIL) 200 MG tablet Take 400 mg by mouth every 8 (eight) hours as  needed (pain.).  [name] metoprolol tartrate (LOPRESSOR) 25 MG tablet TAKE 0.5 TABLETS BY MOUTH 2 TIMES DAILY.  . Multiple Vitamin (MULTIVITAMIN WITH MINERALS) TABS tablet Take 1 tablet by mouth daily. Centrum Silver  . Omega-3 Fatty Acids (SALMON OIL-1000 PO) Take 1,000 mg by mouth daily.  . sildenafil (REVATIO) 20 MG tablet Take 2-5 pills as needed     Allergies:   Patient has no known allergies.   Social History   Socioeconomic History  . Marital status: Married    Spouse name: Not on file  . Number of children: Not on file  . Years of education: Not on file  . Highest education level: Not on file  Occupational History  . Not on file  Tobacco Use  . Smoking status: Former Smoker    Types: Cigarettes    Quit date: 03/05/1971    Years since quitting: 49.2  . Smokeless tobacco: Never Used  . Tobacco comment: smoked  1970-1973, up to 1/2 ppd  Vaping Use  . Vaping Use: Never used  Substance and Sexual Activity  . Alcohol use: Yes    Comment: socially; 2-3 / week  . Drug use: No  . Sexual activity: Not on file  Other Topics Concern  . Not on file  Social  History Narrative   Exercise: walks, chops wood   Social Determinants of Health   Financial Resource Strain: Low Risk   . Difficulty of Paying Living Expenses: Not hard at all  Food Insecurity: No Food Insecurity  . Worried About Charity fundraiser in the Last Year: Never true  . Ran Out of Food in the Last Year: Never true  Transportation Needs: No Transportation Needs  . Lack of Transportation (Medical): No  . Lack of Transportation (Non-Medical): No  Physical Activity: Sufficiently Active  . Days of Exercise per Week: 5 days  . Minutes of Exercise per Session: 30 min  Stress: No Stress Concern Present  . Feeling of Stress : Not at all  Social Connections: Socially Integrated  . Frequency of Communication with Friends and Family: More than three times a week  . Frequency of Social Gatherings with Friends and Family:  More than three times a week  . Attends Religious Services: More than 4 times per year  . Active Member of Clubs or Organizations: Yes  . Attends Archivist Meetings: More than 4 times per year  . Marital Status: Married    Social:  Drummer in the band: [employer]  Married Decrease alcohol post AFl Dx  Family History: The patient's family history includes Cancer in his brother; Diabetes in his father and sister; Heart attack in his maternal uncle; Heart attack (age of onset: 51) in his mother; Hypertension in his sister; Lung cancer in his father. There is no history of Stomach cancer, Colon cancer, Esophageal cancer, or Pancreatic cancer.   History of coronary artery disease notable for mother and uncle. History of heart failure notable for no members. History of arrhythmia notable for no members. No history of bicuspid aortic valve or aortic aneurysm or dissection.  ROS:   Please see the history of present illness.     All other systems reviewed and are negative.  EKGs/Labs/Other Studies Reviewed:    The following studies were reviewed today:  EKG:   06/09/20: Sinus rhythm with PACs 76 04/24/20:  Sinus Bradycardia  Rate 50 with PVCs 03/08/20: Atrial Flutter 87 02/17/20: AFl rate 98   Transthoracic Echocardiogram: Date: 03/27/20 Results: 1. Left ventricular ejection fraction, by estimation, is 60 to 65%. The  left ventricle has normal function. The left ventricle has no regional  wall motion abnormalities. Left ventricular diastolic function could not  be evaluated.  2. Right ventricular systolic function is normal. The right ventricular  size is normal. There is normal pulmonary artery systolic pressure. The  estimated right ventricular systolic pressure is 81.1 mmHg.  3. Left atrial size was moderately dilated.  4. The mitral valve is normal in structure. Mild mitral valve  regurgitation. No evidence of mitral stenosis.  5. Tricuspid valve regurgitation is mild  to moderate.  6. The aortic valve is normal in structure. Aortic valve regurgitation is  trivial. No aortic stenosis is present.  7. The inferior vena cava is normal in size with greater than 50%  respiratory variability, suggesting right atrial pressure of 3 mmHg.    Recent Labs: 02/17/2020: ALT 29; TSH 1.32 04/10/2020: BUN 15; Creatinine, Ser 0.86; Hemoglobin 14.3; Platelets 272; Potassium 4.5; Sodium 138  Recent Lipid Panel    Component Value Date/Time   CHOL 177 03/22/2019 0843   TRIG 66.0 03/22/2019 0843   HDL 61.50 03/22/2019 0843   CHOLHDL 3 03/22/2019 0843   VLDL 13.2 03/22/2019 0843   LDLCALC 102 (H) 03/22/2019 9147  Risk Assessment/Calculations:     CHA2DS2-VASc Score = 1  This indicates a 0.6% annual risk of stroke. The patient's score is based upon: CHF History: No HTN History: No Diabetes History: No Stroke History: No Vascular Disease History: No Age Score: 1 Gender Score: 0    Physical Exam:    VS:  BP 118/68   Pulse 76   Ht 5\' 10"  (1.778 m)   Wt 190 lb 3.2 oz (86.3 kg)   SpO2 98%   BMI 27.29 kg/m     Wt Readings from Last 3 Encounters:  06/09/20 190 lb 3.2 oz (86.3 kg)  05/17/20 195 lb (88.5 kg)  04/26/20 203 lb (92.1 kg)    GEN:  Well nourished, well developed in no acute distress HEENT: Normal NECK: No JVD; No carotid bruits LYMPHATICS: No lymphadenopathy CARDIAC: no murmurs, rubs, gallops RESPIRATORY:  Clear to auscultation without rales, wheezing or rhonchi  ABDOMEN: Soft, non-tender, non-distended MUSCULOSKELETAL:  No edema; No deformity  SKIN: Warm and dry NEUROLOGIC:  Alert and oriented x 3 PSYCHIATRIC:  Normal affect   ASSESSMENT:    1. Atrial flutter, unspecified type ([place])   2. Mixed hyperlipidemia   3. Right shoulder pain, unspecified chronicity    PLAN:    In order of problems listed above:  Atrial Flutter - Risk factors include age - CHADSVASC=1: received AC for 30 days after DCCV - TSH 1.32 - discussed alcohol  and exercise as preventive factors - OSA Risk low presently - SHARED DECISION MAKING: discussed pros and cons of AC; presently will keep AC but if has bleeding issues or issues with cost will instead switch to ASA - Continue rate control with metoprolol   Surgery for R Arm Bursitis - can stop AC as needed for surgery; no further cardiac risk stratification needed  Hyperlipidemia (mixed) -LDL goal less than 100 - gave education on dietary changes; will check at next visit (deferred X2 with plans for life style changes)  Six months follow up unless new symptoms or abnormal test results warranting change in plan  Would be reasonable for  APP Follow up   Medication Adjustments/Labs and Tests Ordered: Current medicines are reviewed at length with the patient today.  Concerns regarding medicines are outlined above.  Orders Placed This Encounter  Procedures  . EKG 12-Lead   No orders of the defined types were placed in this encounter.   Patient Instructions  Medication Instructions:  Your physician recommends that you continue on your current medications as directed. Please refer to the Current Medication list given to you today.  *If you need a refill on your cardiac medications before your next appointment, please call your pharmacy*   Lab Work: NONE If you have labs (blood work) drawn today and your tests are completely normal, you will receive your results only by: [name] MyChart Message (if you have MyChart) OR . A paper copy in the mail If you have any lab test that is abnormal or we need to change your treatment, we will call you to review the results.   Testing/Procedures: NONE   Follow-Up: At [facility], you and your health needs are our priority.  As part of our continuing mission to provide you with exceptional heart care, we have created designated Provider Care Teams.  These Care Teams include your primary Cardiologist (physician) and Advanced Practice Providers  (APPs -  Physician Assistants and Nurse Practitioners) who all work together to provide you with the care you need, when you  need it.  We recommend signing up for the patient portal called "MyChart".  Sign up information is provided on this After Visit Summary.  MyChart is used to connect with patients for Virtual Visits (Telemedicine).  Patients are able to view lab/test results, encounter notes, upcoming appointments, etc.  Non-urgent messages can be sent to your provider as well.   To learn more about what you can do with MyChart, go to [email or web address].    Your next appointment:   7-8 month(s)  The format for your next appointment:   In Person  Provider:   You may see Werner Lean, MD or one of the following Advanced Practice Providers on your designated Care Team:    Melina Copa, PA-C  Ermalinda Barrios, PA-C          Signed, Werner Lean, MD  06/09/2020 10:51 AM    [place]

## 2020-06-15 ENCOUNTER — Ambulatory Visit: Payer: Medicare HMO | Admitting: Sports Medicine

## 2020-06-22 ENCOUNTER — Ambulatory Visit: Payer: Medicare HMO | Admitting: Sports Medicine

## 2020-06-22 ENCOUNTER — Other Ambulatory Visit: Payer: Self-pay

## 2020-06-22 VITALS — BP 118/86 | Ht 70.0 in | Wt 185.0 lb

## 2020-06-22 DIAGNOSIS — M25511 Pain in right shoulder: Secondary | ICD-10-CM | POA: Diagnosis not present

## 2020-06-23 NOTE — Progress Notes (Signed)
   Subjective:    Patient ID: Donald Wilkins, male    DOB: 07/15/1950, 70 y.o.   MRN: [id number]  HPI   Donald Wilkins presents today for follow-up on right shoulder pain.  He has not noticed any real improvement since his subacromial cortisone injection.  He still endorses lateral shoulder pain and weakness.  Pain all began several weeks ago when he slipped on some ice and fell directly on his right shoulder.    Review of Systems As above    Objective:   Physical Exam  Well-developed, well-nourished.  No acute distress  Right shoulder: Patient has full active forward flexion and abduction.  Full external rotation.  Limited internal rotation.  No tenderness to palpation.  4/5 strength with resisted supraspinatus and external rotation on the right.  Neurovascularly intact distally.  Left shoulder: Full active and passive range of motion.  Rotator cuff strength is 5/5.  Neurovascularly intact distally.      Assessment & Plan:   Persistent right shoulder pain likely secondary to rotator cuff tear  Given his persistent pain despite recent subacromial cortisone injection, coupled with his weakness on exam today, we need to proceed with an MRI scan of the right shoulder specifically to rule out rotator cuff tear that may need operative intervention.  Phone follow-up with those results when available.  We will delineate further treatment based on those findings.

## 2020-06-29 ENCOUNTER — Other Ambulatory Visit: Payer: Medicare HMO

## 2020-07-03 ENCOUNTER — Other Ambulatory Visit: Payer: Self-pay | Admitting: *Deleted

## 2020-07-03 ENCOUNTER — Ambulatory Visit
Admission: RE | Admit: 2020-07-03 | Discharge: 2020-07-03 | Disposition: A | Discharge status: Home / Self Care | Payer: Medicare HMO | Source: Ambulatory Visit | Attending: Sports Medicine | Admitting: Sports Medicine

## 2020-07-03 DIAGNOSIS — S43081A Other subluxation of right shoulder joint, initial encounter: Secondary | ICD-10-CM | POA: Diagnosis not present

## 2020-07-03 DIAGNOSIS — M19011 Primary osteoarthritis, right shoulder: Secondary | ICD-10-CM | POA: Diagnosis not present

## 2020-07-03 DIAGNOSIS — M25511 Pain in right shoulder: Secondary | ICD-10-CM

## 2020-07-05 ENCOUNTER — Ambulatory Visit (HOSPITAL_COMMUNITY): Payer: Medicare HMO

## 2020-07-21 ENCOUNTER — Ambulatory Visit
Admission: RE | Admit: 2020-07-21 | Discharge: 2020-07-21 | Disposition: A | Discharge status: Home / Self Care | Payer: Medicare HMO | Source: Ambulatory Visit | Attending: Sports Medicine | Admitting: Sports Medicine

## 2020-07-21 DIAGNOSIS — M25511 Pain in right shoulder: Secondary | ICD-10-CM | POA: Diagnosis not present

## 2020-07-24 ENCOUNTER — Telehealth: Payer: Self-pay | Admitting: Sports Medicine

## 2020-07-24 NOTE — Telephone Encounter (Signed)
  I spoke with Donald Wilkins on the phone today after we finally got his MRI of his shoulder approved.  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  He also has partial width full-thickness tears of the supraspinatus.  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  Based on these findings I recommended surgical consultation with Dr. Tamera Punt.  Patient is in agreement with that plan.  I will defer further work-up and treatment to the discretion of Dr. Tamera Punt and the patient will follow up with me as needed.

## 2020-07-24 NOTE — Telephone Encounter (Signed)
Dr. [facility] [address] [place], [place] [phone]  Appt: 07/26/2020 @ 9:45 am, arrival time 9:15 am.

## 2020-07-26 DIAGNOSIS — M75121 Complete rotator cuff tear or rupture of right shoulder, not specified as traumatic: Secondary | ICD-10-CM | POA: Diagnosis not present

## 2020-08-01 ENCOUNTER — Telehealth: Payer: Self-pay | Admitting: *Deleted

## 2020-08-01 NOTE — Telephone Encounter (Addendum)
Patient with diagnosis of atrial flutter s/p DCCV 04/24/2020 on Eliquis for anticoagulation.    Procedure: RIGHT SHOULDER ARTHROSCOPY ROTATOR CUFF REPAIR, SUBACROMIAL DECOMPRESSION, POSSIBLE BICEPS TENOTOMY    Date of procedure: 08/14/20  CHA2DS2-VASc Score = 1  This indicates a 0.6% annual risk of stroke. The patient's score is based upon: CHF History: No HTN History: No Diabetes History: No Stroke History: No Vascular Disease History: No Age Score: 1 Gender Score: 0   CrCl 83 ml/min Platelet count 272K  Per office protocol, patient can hold Eliquis for 3 days prior to procedure.

## 2020-08-01 NOTE — Telephone Encounter (Signed)
   [facility] Pre-operative Risk Assessment    Patient Name: Donald Wilkins  DOB: [birth year]  MRN: [id number]   HEARTCARE STAFF: - Please ensure there is not already an duplicate clearance open for this procedure. - Under Visit Info/Reason for Call, type in Other and utilize the format Clearance MM/DD/YY or Clearance TBD. Do not use dashes or single digits. - If request is for dental extraction, please clarify the # of teeth to be extracted. - If the patient is currently at the dentist's office, call Pre-Op APP to address. If the patient is not currently in the dentist office, please route to the Pre-Op pool  Request for surgical clearance:  1. What type of surgery is being performed? RIGHT SHOULDER ARTHROSCOPY ROTATOR CUFF REPAIR, SUBACROMIAL DECOMPRESSION, POSSIBLE BICEPS TENOTOMY    2. When is this surgery scheduled? 08/14/20   3. What type of clearance is required (medical clearance vs. Pharmacy clearance to hold med vs. Both)? BOTH  4. Are there any medications that need to be held prior to surgery and how long? ELIQUIS   5. Practice name and name of physician performing surgery? [facility]; DR. Larkin Ina CHANDLER   6. What is the office phone number? [phone]   7.   What is the office fax number? [phone]  8.   Anesthesia type (None, local, MAC, general) ? CHOICE   Julaine Hua 08/01/2020, 12:21 PM  _________________________________________________________________   (provider comments below)

## 2020-08-01 NOTE — Telephone Encounter (Signed)
   Name: Donald Wilkins  DOB: April 21, 1950  MRN: [id number]   Primary Cardiologist: Werner Lean, MD  Chart reviewed as part of pre-operative protocol coverage. Patient was contacted 08/01/2020 in reference to pre-operative risk assessment for pending surgery as outlined below.  Annabell Howells was last seen on 06/09/20 by Dr. Gasper Sells.  Since that day, ARYE WEYENBERG has done well from a cardiac standpoint. He can easily complete 4 METs without anginal complaints.  Therefore, based on ACC/AHA guidelines, the patient would be at acceptable risk for the planned procedure without further cardiovascular testing.   The patient was advised that if he develops new symptoms prior to surgery to contact our office to arrange for a follow-up visit, and he verbalized understanding.  Per pharmacy recommendations, patient can hold eliquis 3 days prior to his upcoming procedure with plans to restart when cleared to do so by his surgeon.   I will route this recommendation to the requesting party via Epic fax function and remove from pre-op pool. Please call with questions.  Abigail Butts, PA-C 08/01/2020, 3:38 PM

## 2020-08-02 ENCOUNTER — Other Ambulatory Visit: Payer: Self-pay | Admitting: Internal Medicine

## 2020-08-14 DIAGNOSIS — X58XXXA Exposure to other specified factors, initial encounter: Secondary | ICD-10-CM | POA: Diagnosis not present

## 2020-08-14 DIAGNOSIS — S43081A Other subluxation of right shoulder joint, initial encounter: Secondary | ICD-10-CM | POA: Diagnosis not present

## 2020-08-14 DIAGNOSIS — M67813 Other specified disorders of tendon, right shoulder: Secondary | ICD-10-CM | POA: Diagnosis not present

## 2020-08-14 DIAGNOSIS — Y999 Unspecified external cause status: Secondary | ICD-10-CM | POA: Diagnosis not present

## 2020-08-14 DIAGNOSIS — S46011A Strain of muscle(s) and tendon(s) of the rotator cuff of right shoulder, initial encounter: Secondary | ICD-10-CM | POA: Diagnosis not present

## 2020-08-14 DIAGNOSIS — G8918 Other acute postprocedural pain: Secondary | ICD-10-CM | POA: Diagnosis not present

## 2020-08-14 DIAGNOSIS — M7541 Impingement syndrome of right shoulder: Secondary | ICD-10-CM | POA: Diagnosis not present

## 2020-08-22 DIAGNOSIS — R531 Weakness: Secondary | ICD-10-CM | POA: Diagnosis not present

## 2020-08-22 DIAGNOSIS — M25611 Stiffness of right shoulder, not elsewhere classified: Secondary | ICD-10-CM | POA: Diagnosis not present

## 2020-08-24 DIAGNOSIS — M25611 Stiffness of right shoulder, not elsewhere classified: Secondary | ICD-10-CM | POA: Diagnosis not present

## 2020-08-24 DIAGNOSIS — R531 Weakness: Secondary | ICD-10-CM | POA: Diagnosis not present

## 2020-08-29 DIAGNOSIS — M25611 Stiffness of right shoulder, not elsewhere classified: Secondary | ICD-10-CM | POA: Diagnosis not present

## 2020-08-29 DIAGNOSIS — R531 Weakness: Secondary | ICD-10-CM | POA: Diagnosis not present

## 2020-09-01 DIAGNOSIS — M25611 Stiffness of right shoulder, not elsewhere classified: Secondary | ICD-10-CM | POA: Diagnosis not present

## 2020-09-01 DIAGNOSIS — R531 Weakness: Secondary | ICD-10-CM | POA: Diagnosis not present

## 2020-09-06 DIAGNOSIS — R531 Weakness: Secondary | ICD-10-CM | POA: Diagnosis not present

## 2020-09-06 DIAGNOSIS — M25611 Stiffness of right shoulder, not elsewhere classified: Secondary | ICD-10-CM | POA: Diagnosis not present

## 2020-09-08 DIAGNOSIS — R531 Weakness: Secondary | ICD-10-CM | POA: Diagnosis not present

## 2020-09-08 DIAGNOSIS — M25611 Stiffness of right shoulder, not elsewhere classified: Secondary | ICD-10-CM | POA: Diagnosis not present

## 2020-09-11 DIAGNOSIS — R531 Weakness: Secondary | ICD-10-CM | POA: Diagnosis not present

## 2020-09-11 DIAGNOSIS — M25611 Stiffness of right shoulder, not elsewhere classified: Secondary | ICD-10-CM | POA: Diagnosis not present

## 2020-09-13 DIAGNOSIS — R531 Weakness: Secondary | ICD-10-CM | POA: Diagnosis not present

## 2020-09-13 DIAGNOSIS — M25611 Stiffness of right shoulder, not elsewhere classified: Secondary | ICD-10-CM | POA: Diagnosis not present

## 2020-09-18 DIAGNOSIS — M25611 Stiffness of right shoulder, not elsewhere classified: Secondary | ICD-10-CM | POA: Diagnosis not present

## 2020-09-18 DIAGNOSIS — R531 Weakness: Secondary | ICD-10-CM | POA: Diagnosis not present

## 2020-09-20 DIAGNOSIS — M25611 Stiffness of right shoulder, not elsewhere classified: Secondary | ICD-10-CM | POA: Diagnosis not present

## 2020-09-20 DIAGNOSIS — R531 Weakness: Secondary | ICD-10-CM | POA: Diagnosis not present

## 2020-09-22 ENCOUNTER — Telehealth: Payer: Self-pay

## 2020-09-22 NOTE — Telephone Encounter (Signed)
Yes, it is recommended that he gets his second booster

## 2020-09-22 NOTE — Telephone Encounter (Signed)
pt is asking is it recommended that he receives his 2nd booster?  **Sent to [facility]

## 2020-09-25 DIAGNOSIS — M25611 Stiffness of right shoulder, not elsewhere classified: Secondary | ICD-10-CM | POA: Diagnosis not present

## 2020-09-25 DIAGNOSIS — R531 Weakness: Secondary | ICD-10-CM | POA: Diagnosis not present

## 2020-09-25 NOTE — Telephone Encounter (Signed)
Spoke with patient today. 

## 2020-09-27 DIAGNOSIS — R531 Weakness: Secondary | ICD-10-CM | POA: Diagnosis not present

## 2020-09-27 DIAGNOSIS — M25611 Stiffness of right shoulder, not elsewhere classified: Secondary | ICD-10-CM | POA: Diagnosis not present

## 2020-10-02 ENCOUNTER — Other Ambulatory Visit: Payer: Self-pay | Admitting: Internal Medicine

## 2020-10-02 DIAGNOSIS — R531 Weakness: Secondary | ICD-10-CM | POA: Diagnosis not present

## 2020-10-02 DIAGNOSIS — M25611 Stiffness of right shoulder, not elsewhere classified: Secondary | ICD-10-CM | POA: Diagnosis not present

## 2020-10-04 DIAGNOSIS — M25611 Stiffness of right shoulder, not elsewhere classified: Secondary | ICD-10-CM | POA: Diagnosis not present

## 2020-10-04 DIAGNOSIS — R531 Weakness: Secondary | ICD-10-CM | POA: Diagnosis not present

## 2020-10-09 DIAGNOSIS — R531 Weakness: Secondary | ICD-10-CM | POA: Diagnosis not present

## 2020-10-09 DIAGNOSIS — M25611 Stiffness of right shoulder, not elsewhere classified: Secondary | ICD-10-CM | POA: Diagnosis not present

## 2020-10-11 DIAGNOSIS — R531 Weakness: Secondary | ICD-10-CM | POA: Diagnosis not present

## 2020-10-11 DIAGNOSIS — M25611 Stiffness of right shoulder, not elsewhere classified: Secondary | ICD-10-CM | POA: Diagnosis not present

## 2020-10-16 DIAGNOSIS — R531 Weakness: Secondary | ICD-10-CM | POA: Diagnosis not present

## 2020-10-16 DIAGNOSIS — M25611 Stiffness of right shoulder, not elsewhere classified: Secondary | ICD-10-CM | POA: Diagnosis not present

## 2020-10-18 DIAGNOSIS — R531 Weakness: Secondary | ICD-10-CM | POA: Diagnosis not present

## 2020-10-18 DIAGNOSIS — M25611 Stiffness of right shoulder, not elsewhere classified: Secondary | ICD-10-CM | POA: Diagnosis not present

## 2020-10-23 DIAGNOSIS — M25611 Stiffness of right shoulder, not elsewhere classified: Secondary | ICD-10-CM | POA: Diagnosis not present

## 2020-10-23 DIAGNOSIS — R531 Weakness: Secondary | ICD-10-CM | POA: Diagnosis not present

## 2020-10-25 ENCOUNTER — Other Ambulatory Visit: Payer: Self-pay | Admitting: Internal Medicine

## 2020-10-25 DIAGNOSIS — M25611 Stiffness of right shoulder, not elsewhere classified: Secondary | ICD-10-CM | POA: Diagnosis not present

## 2020-10-25 DIAGNOSIS — R531 Weakness: Secondary | ICD-10-CM | POA: Diagnosis not present

## 2020-10-30 ENCOUNTER — Other Ambulatory Visit: Payer: Self-pay | Admitting: Internal Medicine

## 2020-10-30 DIAGNOSIS — R531 Weakness: Secondary | ICD-10-CM | POA: Diagnosis not present

## 2020-10-30 DIAGNOSIS — M25611 Stiffness of right shoulder, not elsewhere classified: Secondary | ICD-10-CM | POA: Diagnosis not present

## 2020-11-01 DIAGNOSIS — M25611 Stiffness of right shoulder, not elsewhere classified: Secondary | ICD-10-CM | POA: Diagnosis not present

## 2020-11-01 DIAGNOSIS — R531 Weakness: Secondary | ICD-10-CM | POA: Diagnosis not present

## 2020-11-07 DIAGNOSIS — M25611 Stiffness of right shoulder, not elsewhere classified: Secondary | ICD-10-CM | POA: Diagnosis not present

## 2020-11-07 DIAGNOSIS — R531 Weakness: Secondary | ICD-10-CM | POA: Diagnosis not present

## 2020-11-09 DIAGNOSIS — M25611 Stiffness of right shoulder, not elsewhere classified: Secondary | ICD-10-CM | POA: Diagnosis not present

## 2020-11-09 DIAGNOSIS — R531 Weakness: Secondary | ICD-10-CM | POA: Diagnosis not present

## 2020-11-13 DIAGNOSIS — M25611 Stiffness of right shoulder, not elsewhere classified: Secondary | ICD-10-CM | POA: Diagnosis not present

## 2020-11-13 DIAGNOSIS — R531 Weakness: Secondary | ICD-10-CM | POA: Diagnosis not present

## 2020-11-15 DIAGNOSIS — R531 Weakness: Secondary | ICD-10-CM | POA: Diagnosis not present

## 2020-11-15 DIAGNOSIS — M25611 Stiffness of right shoulder, not elsewhere classified: Secondary | ICD-10-CM | POA: Diagnosis not present

## 2020-11-20 DIAGNOSIS — L821 Other seborrheic keratosis: Secondary | ICD-10-CM | POA: Diagnosis not present

## 2020-11-20 DIAGNOSIS — D2262 Melanocytic nevi of left upper limb, including shoulder: Secondary | ICD-10-CM | POA: Diagnosis not present

## 2020-11-20 DIAGNOSIS — L814 Other melanin hyperpigmentation: Secondary | ICD-10-CM | POA: Diagnosis not present

## 2020-11-20 DIAGNOSIS — R531 Weakness: Secondary | ICD-10-CM | POA: Diagnosis not present

## 2020-11-20 DIAGNOSIS — D2271 Melanocytic nevi of right lower limb, including hip: Secondary | ICD-10-CM | POA: Diagnosis not present

## 2020-11-20 DIAGNOSIS — M25611 Stiffness of right shoulder, not elsewhere classified: Secondary | ICD-10-CM | POA: Diagnosis not present

## 2020-11-20 DIAGNOSIS — Z85828 Personal history of other malignant neoplasm of skin: Secondary | ICD-10-CM | POA: Diagnosis not present

## 2020-11-20 DIAGNOSIS — D1801 Hemangioma of skin and subcutaneous tissue: Secondary | ICD-10-CM | POA: Diagnosis not present

## 2020-11-20 DIAGNOSIS — D2362 Other benign neoplasm of skin of left upper limb, including shoulder: Secondary | ICD-10-CM | POA: Diagnosis not present

## 2020-11-20 DIAGNOSIS — D2272 Melanocytic nevi of left lower limb, including hip: Secondary | ICD-10-CM | POA: Diagnosis not present

## 2020-11-20 DIAGNOSIS — L820 Inflamed seborrheic keratosis: Secondary | ICD-10-CM | POA: Diagnosis not present

## 2020-11-22 DIAGNOSIS — M25611 Stiffness of right shoulder, not elsewhere classified: Secondary | ICD-10-CM | POA: Diagnosis not present

## 2020-11-22 DIAGNOSIS — R531 Weakness: Secondary | ICD-10-CM | POA: Diagnosis not present

## 2020-11-27 DIAGNOSIS — M25611 Stiffness of right shoulder, not elsewhere classified: Secondary | ICD-10-CM | POA: Diagnosis not present

## 2020-11-27 DIAGNOSIS — R531 Weakness: Secondary | ICD-10-CM | POA: Diagnosis not present

## 2020-11-28 ENCOUNTER — Encounter: Payer: Self-pay | Admitting: Gastroenterology

## 2020-11-29 DIAGNOSIS — R531 Weakness: Secondary | ICD-10-CM | POA: Diagnosis not present

## 2020-11-29 DIAGNOSIS — M25611 Stiffness of right shoulder, not elsewhere classified: Secondary | ICD-10-CM | POA: Diagnosis not present

## 2020-12-04 ENCOUNTER — Other Ambulatory Visit: Payer: Self-pay | Admitting: Internal Medicine

## 2020-12-11 ENCOUNTER — Telehealth: Payer: Self-pay | Admitting: Internal Medicine

## 2020-12-11 DIAGNOSIS — S46011D Strain of muscle(s) and tendon(s) of the rotator cuff of right shoulder, subsequent encounter: Secondary | ICD-10-CM | POA: Diagnosis not present

## 2020-12-11 DIAGNOSIS — Z471 Aftercare following joint replacement surgery: Secondary | ICD-10-CM | POA: Diagnosis not present

## 2020-12-11 DIAGNOSIS — S43081D Other subluxation of right shoulder joint, subsequent encounter: Secondary | ICD-10-CM | POA: Diagnosis not present

## 2020-12-11 DIAGNOSIS — Z96611 Presence of right artificial shoulder joint: Secondary | ICD-10-CM | POA: Diagnosis not present

## 2020-12-11 NOTE — Telephone Encounter (Signed)
Left message for patient to call me back at [phone] to schedule Medicare Annual Wellness Visit   Last AWV  09/16/19  Please schedule at anytime with [facility] if patient calls the office back.    40 Minutes appointment   Any questions, please call me at [phone]

## 2020-12-12 DIAGNOSIS — H524 Presbyopia: Secondary | ICD-10-CM | POA: Diagnosis not present

## 2020-12-12 DIAGNOSIS — H25813 Combined forms of age-related cataract, bilateral: Secondary | ICD-10-CM | POA: Diagnosis not present

## 2020-12-12 DIAGNOSIS — H5213 Myopia, bilateral: Secondary | ICD-10-CM | POA: Diagnosis not present

## 2021-01-03 ENCOUNTER — Telehealth: Payer: Self-pay | Admitting: Internal Medicine

## 2021-01-03 NOTE — Telephone Encounter (Signed)
LVM for pt to rtn my call to schedule AWV with [facility]. Please schedule AWV with [facility] if pt calls the office.

## 2021-01-08 NOTE — Progress Notes (Signed)
Cardiology Office Note:    Date:  01/09/2021   ID:  Donald Wilkins, DOB Oct 21, 1950, MRN [id number]  PCP:  Binnie Rail, MD  [facility] Cardiologist:  Werner Lean, MD  [facility] Electrophysiologist:  None   CC: Post surgery f/u  History of Present Illness:    Donald Wilkins is a 70 y.o. male with a hx of Atrial Flutter, HLD, MVP who presents for evaluation 03/08/20.  In interim of this visit, patient had successful DCCV 04/24/20.  In interim of this visit, patient had surgery for his R arm in June. Seen 01/09/21.  Patient notes that he is doing OK.   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 No chest pain or pressure .  No SOB/DOE and no PND/Orthopnea.  No weight gain or leg swelling.  No palpitations or syncope.  No bleeding issues.  Past Medical History:  Diagnosis Date   A-fib ([place])    Basal cell cancer    X 2;[facility]   Hyperlipidemia    Incomplete right bundle branch block    MVP (mitral valve prolapse)    on ECHO    Past Surgical History:  Procedure Laterality Date   ANAL FISSURE REPAIR     BASAL CELL CARCINOMA EXCISION  2011 & 2012   Mohs L face; back   CARDIOVERSION N/A 04/24/2020   Procedure: CARDIOVERSION;  Surgeon: Werner Lean, MD;  Location: [facility];  Service: Cardiovascular;  Laterality: N/A;   colonoscopy with polypectomy  2005   [facility], Dr Deatra Ina; negative 2010    Current Medications: Current Meds  Medication Sig   aspirin EC 81 MG tablet Take 1 tablet (81 mg total) by mouth daily. Swallow whole.   diclofenac sodium (VOLTAREN) 1 % GEL Apply 4 g topically 4 (four) times daily.   finasteride (PROSCAR) 5 MG tablet TAKE 1/2 TABLET BY MOUTH EVERY 2 DAYS   FLUoxetine (PROZAC) 10 MG capsule TAKE 1 CAPSULE BY MOUTH EVERY DAY   Glucosamine-Chondroitin (OSTEO BI-FLEX REGULAR STRENGTH PO) Take 2 tablets by mouth daily.   ibuprofen (ADVIL) 200 MG tablet Take 400 mg by mouth every 8 (eight) hours as  needed (pain.).   metoprolol tartrate (LOPRESSOR) 25 MG tablet TAKE 1/2 TABLET BY MOUTH TWICE A DAY   Multiple Vitamin (MULTIVITAMIN WITH MINERALS) TABS tablet Take 1 tablet by mouth daily. Centrum Silver   Omega-3 Fatty Acids (SALMON OIL-1000 PO) Take 1,000 mg by mouth daily.   sildenafil (REVATIO) 20 MG tablet Take 2-5 pills as needed   [DISCONTINUED] ELIQUIS 5 MG TABS tablet TAKE 1 TABLET BY MOUTH TWICE A DAY     Allergies:   Patient has no known allergies.   Social History   Socioeconomic History   Marital status: Married    Spouse name: Not on file   Number of children: Not on file   Years of education: Not on file   Highest education level: Not on file  Occupational History   Not on file  Tobacco Use   Smoking status: Former    Types: Cigarettes    Quit date: 03/05/1971    Years since quitting: 49.8   Smokeless tobacco: Never   Tobacco comments:    smoked  1970-1973, up to 1/2 ppd  Vaping Use   Vaping Use: Never used  Substance and Sexual Activity   Alcohol use: Yes    Comment: socially; 2-3 / week   Drug use: No  Sexual activity: Not on file  Other Topics Concern   Not on file  Social History Narrative   Exercise: walks, chops wood   Social Determinants of Health   Financial Resource Strain: Not on file  Food Insecurity: Not on file  Transportation Needs: Not on file  Physical Activity: Not on file  Stress: Not on file  Social Connections: Not on file    Social:  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 There is no history of Stomach cancer, Colon cancer, Esophageal cancer, or Pancreatic cancer.   History of coronary artery disease notable for mother and uncle. History of heart  failure notable for no members. History of arrhythmia notable for no members. No history of bicuspid aortic valve or aortic aneurysm or dissection.  ROS:   Please see the history of present illness.     All other systems reviewed and are negative.  EKGs/Labs/Other Studies Reviewed:    The following studies were reviewed today:  EKG:   01/09/21:  Sinus rhythm rate 80 with with PACs  06/09/20: Sinus rhythm with PACs 76 04/24/20:  Sinus Bradycardia  Rate 50 with PVCs 03/08/20: Atrial Flutter 87 02/17/20: AFl rate 98   Transthoracic Echocardiogram: Date: 03/27/20 Results:  1. Left ventricular ejection fraction, by estimation, is 60 to 65%. The  left ventricle has normal function. The left ventricle has no regional  wall motion abnormalities. Left ventricular diastolic function could not  be evaluated.   2. Right ventricular systolic function is normal. The right ventricular  size is normal. There is normal pulmonary artery systolic pressure. The  estimated right ventricular systolic pressure is 61.9 mmHg.   3. Left atrial size was moderately dilated.   4. The mitral valve is normal in structure. Mild mitral valve  regurgitation. No evidence of mitral stenosis.   5. Tricuspid valve regurgitation is mild to moderate.   6. The aortic valve is normal in structure. Aortic valve regurgitation is  trivial. No aortic stenosis is present.   7. The inferior vena cava is normal in size with greater than 50%  respiratory variability, suggesting right atrial pressure of 3 mmHg.    Recent Labs: 02/17/2020: ALT 29; TSH 1.32 04/10/2020: BUN 15; Creatinine, Ser 0.86; Hemoglobin 14.3; Platelets 272; Potassium 4.5; Sodium 138  Recent Lipid Panel    Component Value Date/Time   CHOL 177 03/22/2019 0843   TRIG 66.0 03/22/2019 0843   HDL 61.50 03/22/2019 0843   CHOLHDL 3 03/22/2019 0843   VLDL 13.2 03/22/2019 0843   LDLCALC 102 (H) 03/22/2019 0843    Risk Assessment/Calculations:     CHA2DS2-VASc  Score = 1  This indicates a 0.6% annual risk of stroke. The patient's score is based upon: CHF History: 0 HTN History: 0 Diabetes History: 0 Stroke History: 0 Vascular Disease History: 0 Age Score: 1 Gender Score: 0    Physical Exam:    VS:  BP 116/62   Pulse (!) 58   Ht 5\' 9"  (1.753 m)   Wt 194 lb (88 kg)   SpO2 98%   BMI 28.65 kg/m     Wt Readings from Last 3 Encounters:  01/09/21 194 lb (88 kg)  06/22/20 185 lb (83.9 kg)  06/09/20  190 lb 3.2 oz (86.3 kg)    Gen: no distress, well nourished   Neck: No JVD,  carotid bruit Cardiac: No Rubs or Gallops, no Murmur, IRIR, no radial pulses Respiratory: Clear to auscultation bilaterally, normal effort, normal  respiratory rate GI: Soft, nontender, non-distended  MS: No  edema;  moves all extremities; good ROM in both arms Integument: Skin feels warm Neuro:  At time of evaluation, alert and oriented to person/place/time/situation  Psych: Normal affect, patient feels    ASSESSMENT:    1. Paroxysmal atrial flutter ([place])   2. Mixed hyperlipidemia   3. MVP (mitral valve prolapse)     PLAN:    In order of problems listed above:  Paroxysmal Atrial Flutter PACs Mild MR- echo in 2025 unless new sx - Risk factors include age - We have reviewed his risks and will transition to ASA 81 mg PO from DOAC cha2ds2-vAsc=1(estimated yearly stroke risk according to Lip et al. is 1,3%), hasbled=1(estimated yearly bleeding risk according to pisters et al. is 1,02%),  - tolerateing metoprolol 12.5 mg PO BID lso for PAC  Hyperlipidemia (mixed) -LDL goal less than 100 - gave education on dietary changes  Will plan for one year follow up unless new symptoms or abnormal test results warranting change in plan     Medication Adjustments/Labs and Tests Ordered: Current medicines are reviewed at length with the patient today.  Concerns regarding medicines are outlined above.  Orders Placed This Encounter  Procedures   EKG 12-Lead     Meds ordered this encounter  Medications   aspirin EC 81 MG tablet    Sig: Take 1 tablet (81 mg total) by mouth daily. Swallow whole.    Dispense:  90 tablet    Refill:  3     Patient Instructions  Medication Instructions:  Your physician has recommended you make the following change in your medication: STOP: Eliquis START: Aspirin 81 mg by mouth daily  *If you need a refill on your cardiac medications before your next appointment, please call your pharmacy*   Lab Work: NONE If you have labs (blood work) drawn today and your tests are completely normal, you will receive your results only by: [place] (if you have MyChart) OR A paper copy in the mail If you have any lab test that is abnormal or we need to change your treatment, we will call you to review the results.   Testing/Procedures: NONE   Follow-Up: At [facility], you and your health needs are our priority.  As part of our continuing mission to provide you with exceptional heart care, we have created designated Provider Care Teams.  These Care Teams include your primary Cardiologist (physician) and Advanced Practice Providers (APPs -  Physician Assistants and Nurse Practitioners) who all work together to provide you with the care you need, when you need it.  We recommend signing up for the patient portal called "MyChart".  Sign up information is provided on this After Visit Summary.  MyChart is used to connect with patients for Virtual Visits (Telemedicine).  Patients are able to view lab/test results, encounter notes, upcoming appointments, etc.  Non-urgent messages can be sent to your provider as well.   To learn more about what you can do with MyChart, go to [email or web address].    Your next appointment:   1 year(s)  The format for your next appointment:   In Person  Provider:   Werner Lean, MD {  Signed, Werner Lean, MD  01/09/2021 10:12 AM    [place]

## 2021-01-09 ENCOUNTER — Ambulatory Visit: Payer: Medicare HMO | Admitting: Internal Medicine

## 2021-01-09 ENCOUNTER — Encounter: Payer: Self-pay | Admitting: Internal Medicine

## 2021-01-09 ENCOUNTER — Other Ambulatory Visit: Payer: Self-pay

## 2021-01-09 VITALS — BP 116/62 | HR 58 | Ht 69.0 in | Wt 194.0 lb

## 2021-01-09 DIAGNOSIS — E782 Mixed hyperlipidemia: Secondary | ICD-10-CM

## 2021-01-09 DIAGNOSIS — I341 Nonrheumatic mitral (valve) prolapse: Secondary | ICD-10-CM | POA: Diagnosis not present

## 2021-01-09 DIAGNOSIS — I4892 Unspecified atrial flutter: Secondary | ICD-10-CM

## 2021-01-09 MED ORDER — ASPIRIN EC 81 MG PO TBEC: 81.0000 mg | DELAYED_RELEASE_TABLET | Freq: Every day | ORAL | 3 refills | Status: DC

## 2021-01-09 NOTE — Patient Instructions (Signed)
Medication Instructions:  Your physician has recommended you make the following change in your medication: STOP: Eliquis START: Aspirin 81 mg by mouth daily  *If you need a refill on your cardiac medications before your next appointment, please call your pharmacy*   Lab Work: NONE If you have labs (blood work) drawn today and your tests are completely normal, you will receive your results only by: [place] (if you have MyChart) OR A paper copy in the mail If you have any lab test that is abnormal or we need to change your treatment, we will call you to review the results.   Testing/Procedures: NONE   Follow-Up: At [facility], you and your health needs are our priority.  As part of our continuing mission to provide you with exceptional heart care, we have created designated Provider Care Teams.  These Care Teams include your primary Cardiologist (physician) and Advanced Practice Providers (APPs -  Physician Assistants and Nurse Practitioners) who all work together to provide you with the care you need, when you need it.  We recommend signing up for the patient portal called "MyChart".  Sign up information is provided on this After Visit Summary.  MyChart is used to connect with patients for Virtual Visits (Telemedicine).  Patients are able to view lab/test results, encounter notes, upcoming appointments, etc.  Non-urgent messages can be sent to your provider as well.   To learn more about what you can do with MyChart, go to [email or web address].    Your next appointment:   1 year(s)  The format for your next appointment:   In Person  Provider:   Werner Lean, MD {

## 2021-02-17 DIAGNOSIS — R4020 Unspecified coma: Secondary | ICD-10-CM | POA: Diagnosis not present

## 2021-02-17 DIAGNOSIS — I499 Cardiac arrhythmia, unspecified: Secondary | ICD-10-CM | POA: Diagnosis not present

## 2021-02-17 DIAGNOSIS — R55 Syncope and collapse: Secondary | ICD-10-CM | POA: Diagnosis not present

## 2021-03-25 ENCOUNTER — Encounter: Payer: Self-pay | Admitting: Internal Medicine

## 2021-03-25 NOTE — Patient Instructions (Addendum)
Blood work was ordered.     Medications changes include :   none   Please followup in 1 year   Health Maintenance, Male Adopting a healthy lifestyle and getting preventive care are important in promoting health and wellness. Ask your health care provider about: The right schedule for you to have regular tests and exams. Things you can do on your own to prevent diseases and keep yourself healthy. What should I know about diet, weight, and exercise? Eat a healthy diet  Eat a diet that includes plenty of vegetables, fruits, low-fat dairy products, and lean protein. Do not eat a lot of foods that are high in solid fats, added sugars, or sodium. Maintain a healthy weight Body mass index (BMI) is a measurement that can be used to identify possible weight problems. It estimates body fat based on height and weight. Your health care provider can help determine your BMI and help you achieve or maintain a healthy weight. Get regular exercise Get regular exercise. This is one of the most important things you can do for your health. Most adults should: Exercise for at least 150 minutes each week. The exercise should increase your heart rate and make you sweat (moderate-intensity exercise). Do strengthening exercises at least twice a week. This is in addition to the moderate-intensity exercise. Spend less time sitting. Even light physical activity can be beneficial. Watch cholesterol and blood lipids Have your blood tested for lipids and cholesterol at 69 years of age, then have this test every 5 years. You may need to have your cholesterol levels checked more often if: Your lipid or cholesterol levels are high. You are older than 68 years of age. You are at high risk for heart disease. What should I know about cancer screening? Many types of cancers can be detected early and may often be prevented. Depending on your health history and family history, you may need to have cancer screening at  various ages. This may include screening for: Colorectal cancer. Prostate cancer. Skin cancer. Lung cancer. What should I know about heart disease, diabetes, and high blood pressure? Blood pressure and heart disease High blood pressure causes heart disease and increases the risk of stroke. This is more likely to develop in people who have high blood pressure readings or are overweight. Talk with your health care provider about your target blood pressure readings. Have your blood pressure checked: Every 3-5 years if you are 16-66 years of age. Every year if you are 42 years old or older. If you are between the ages of 37 and 59 and are a current or former smoker, ask your health care provider if you should have a one-time screening for abdominal aortic aneurysm (AAA). Diabetes Have regular diabetes screenings. This checks your fasting blood sugar level. Have the screening done: Once every three years after age 87 if you are at a normal weight and have a low risk for diabetes. More often and at a younger age if you are overweight or have a high risk for diabetes. What should I know about preventing infection? Hepatitis B If you have a higher risk for hepatitis B, you should be screened for this virus. Talk with your health care provider to find out if you are at risk for hepatitis B infection. Hepatitis C Blood testing is recommended for: Everyone born from [birth year] through 1963. Anyone with known risk factors for hepatitis C. Sexually transmitted infections (STIs) You should be screened each year for STIs, including gonorrhea and  chlamydia, if: You are sexually active and are younger than 69 years of age. You are older than 69 years of age and your health care provider tells you that you are at risk for this type of infection. Your sexual activity has changed since you were last screened, and you are at increased risk for chlamydia or gonorrhea. Ask your health care provider if you are at  risk. Ask your health care provider about whether you are at high risk for HIV. Your health care provider may recommend a prescription medicine to help prevent HIV infection. If you choose to take medicine to prevent HIV, you should first get tested for HIV. You should then be tested every 3 months for as long as you are taking the medicine. Follow these instructions at home: Alcohol use Do not drink alcohol if your health care provider tells you not to drink. If you drink alcohol: Limit how much you have to 0-2 drinks a day. Know how much alcohol is in your drink. In the [place] one drink equals one 12 oz bottle of beer (355 mL), one 5 oz glass of wine (148 mL), or one 1 oz glass of hard liquor (44 mL). Lifestyle Do not use any products that contain nicotine or tobacco. These products include cigarettes, chewing tobacco, and vaping devices, such as e-cigarettes. If you need help quitting, ask your health care provider. Do not use street drugs. Do not share needles. Ask your health care provider for help if you need support or information about quitting drugs. General instructions Schedule regular health, dental, and eye exams. Stay current with your vaccines. Tell your health care provider if: You often feel depressed. You have ever been abused or do not feel safe at home. Summary Adopting a healthy lifestyle and getting preventive care are important in promoting health and wellness. Follow your health care provider's instructions about healthy diet, exercising, and getting tested or screened for diseases. Follow your health care provider's instructions on monitoring your cholesterol and blood pressure. This information is not intended to replace advice given to you by your health care provider. Make sure you discuss any questions you have with your health care provider. Document Revised: 07/10/2020 Document Reviewed: 07/10/2020 Elsevier Patient Education  [place].

## 2021-03-25 NOTE — Progress Notes (Signed)
Subjective:    Patient ID: Donald Wilkins, male    DOB: [birth year], 71 y.o.   MRN: [id number]   This visit occurred during the SARS-CoV-2 public health emergency.  Safety protocols were in place, including screening questions prior to the visit, additional usage of staff PPE, and extensive cleaning of exam room while observing appropriate contact time as indicated for disinfecting solutions.   HPI He is here for a physical exam.   He has no concerns.  Overall feeling well.    Medications and allergies reviewed with patient and updated if appropriate.  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 Swallow whole. 90 tablet 3   diclofenac sodium (VOLTAREN) 1 % GEL Apply 4 g topically 4 (four) times daily. 100 g 5   finasteride (PROSCAR) 5 MG tablet TAKE 1/2 TABLET BY MOUTH EVERY 2 DAYS 90 tablet 3   FLUoxetine (PROZAC) 10 MG capsule TAKE 1 CAPSULE BY MOUTH EVERY DAY 90 capsule 3   Glucosamine-Chondroitin (OSTEO BI-FLEX REGULAR STRENGTH PO) Take 2 tablets by mouth daily.     ibuprofen (ADVIL) 200 MG tablet Take 400 mg by mouth every 8 (eight) hours as needed (pain.).     metoprolol tartrate (LOPRESSOR) 25 MG tablet TAKE 1/2 TABLET BY MOUTH TWICE A  DAY 90 tablet 1   Multiple Vitamin (MULTIVITAMIN WITH MINERALS) TABS tablet Take 1 tablet by mouth daily. Centrum Silver     Omega-3 Fatty Acids (SALMON OIL-1000 PO) Take 1,000 mg by mouth daily.     sildenafil (REVATIO) 20 MG tablet Take 2-5 pills as needed 40 tablet 5   No current facility-administered medications on file prior to visit.    Past Medical History:  Diagnosis Date   A-fib ([place])    Basal cell cancer    X 2;[facility]   Hyperlipidemia    Incomplete right bundle branch block    MVP (mitral valve prolapse)    on ECHO    Past Surgical History:  Procedure Laterality Date   ANAL FISSURE REPAIR     BASAL CELL CARCINOMA EXCISION  2011 & 2012   Mohs L face; back   CARDIOVERSION N/A 04/24/2020   Procedure: CARDIOVERSION;  Surgeon: Werner Lean, MD;  Location: [facility];  Service: Cardiovascular;  Laterality: N/A;   colonoscopy with polypectomy  2005   [facility], Dr Deatra Ina; negative 2010    Social History   Socioeconomic History   Marital status: Married    Spouse name: Not on file   Number of children: Not on file   Years of education: Not on file   Highest education level: Not on file  Occupational History   Not on file  Tobacco Use  Smoking status: Former    Types: Cigarettes    Quit date: 03/05/1971    Years since quitting: 50.0   Smokeless tobacco: Never   Tobacco comments:    smoked  1970-1973, up to 1/2 ppd  Vaping Use   Vaping Use: Never used  Substance and Sexual Activity   Alcohol use: Yes    Comment: socially; 2-3 / week   Drug use: No   Sexual activity: Not on file  Other Topics Concern   Not on file  Social History Narrative   Exercise: walks, chops wood   Social Determinants of Health   Financial Resource Strain: Not on file  Food Insecurity: Not on file  Transportation Needs: Not on file  Physical Activity: Not on file  Stress: Not on file  Social Connections: Not on file    Family History  Problem Relation Age of  Onset   Heart attack Mother 59   Diabetes Father    Lung cancer Father        smoker   Heart attack Maternal Uncle        pre 65   Cancer Brother        malignant,brain stem   Diabetes Sister    Hypertension Sister    Stomach cancer Neg Hx    Colon cancer Neg Hx    Esophageal cancer Neg Hx    Pancreatic cancer Neg Hx     Review of Systems  Constitutional:  Negative for chills and fever.  Eyes:  Negative for visual disturbance.  Respiratory:  Negative for cough, shortness of breath and wheezing.   Cardiovascular:  Negative for chest pain, palpitations and leg swelling.  Gastrointestinal:  Negative for abdominal pain, blood in stool, constipation, diarrhea and nausea.       No gerd  Genitourinary:  Negative for difficulty urinating, dysuria and hematuria.  Musculoskeletal:  Positive for arthralgias (thumbs OA, right shoulder). Negative for back pain.  Skin:  Negative for rash.  Neurological:  Negative for dizziness, light-headedness and headaches.  Psychiatric/Behavioral:  Positive for decreased concentration. Negative for sleep disturbance. The patient is not nervous/anxious.       Objective:   Vitals:   03/27/21 0900  BP: 114/72  Pulse: (!) 52  Temp: 97.6 F (36.4 C)  SpO2: 98%   Filed Weights   03/27/21 0900  Weight: 197 lb 3.2 oz (89.4 kg)   Body mass index is 29.12 kg/m.  BP Readings from Last 3 Encounters:  03/27/21 114/72  01/09/21 116/62  06/22/20 118/86    Wt Readings from Last 3 Encounters:  03/27/21 197 lb 3.2 oz (89.4 kg)  01/09/21 194 lb (88 kg)  06/22/20 185 lb (83.9 kg)     Physical Exam Constitutional: He appears well-developed and well-nourished. No distress.  HENT:  Head: Normocephalic and atraumatic.  Right Ear: External ear normal.  Left Ear: External ear normal.  Mouth/Throat: Oropharynx is clear and moist.  Normal ear canals and TM b/l  Eyes: Conjunctivae and EOM are normal.  Neck: Neck supple. No tracheal deviation present. No  thyromegaly present.  No carotid bruit  Cardiovascular: Normal rate, regular rhythm, normal heart sounds and intact distal pulses.   No murmur heard. Pulmonary/Chest: Effort normal and breath sounds normal. No respiratory distress. He has no wheezes. He has no rales.  Abdominal: Soft. He exhibits no distension. There is no tenderness.  Genitourinary: deferred  Musculoskeletal: He exhibits no edema.  Lymphadenopathy:   He has no cervical adenopathy.  Skin:  Skin is warm and dry. He is not diaphoretic.  Psychiatric: He has a normal mood and affect. His behavior is normal.         Assessment & Plan:   Physical exam: Screening blood work  ordered Exercise   walking 5/week Weight  overweight Substance abuse   none   Reviewed recommended immunizations.  Will get 2nd shingrix.     Will schedule colonoscopy   Health Maintenance  Topic Date Due   Zoster Vaccines- Shingrix (2 of 2) 09/06/2019   COLONOSCOPY (Pts 45-26yrs Insurance coverage will need to be confirmed)  12/06/2020   TETANUS/TDAP  12/01/2028   Pneumonia Vaccine 40+ Years old  Completed   INFLUENZA VACCINE  Completed   COVID-19 Vaccine  Completed   Hepatitis C Screening  Completed   HPV VACCINES  Aged Out     See Problem List for Assessment and Plan of chronic medical problems.

## 2021-03-27 ENCOUNTER — Other Ambulatory Visit: Payer: Self-pay

## 2021-03-27 ENCOUNTER — Ambulatory Visit (INDEPENDENT_AMBULATORY_CARE_PROVIDER_SITE_OTHER): Payer: Medicare HMO | Admitting: Internal Medicine

## 2021-03-27 VITALS — BP 114/72 | HR 52 | Temp 97.6°F | Ht 69.0 in | Wt 197.2 lb

## 2021-03-27 DIAGNOSIS — E782 Mixed hyperlipidemia: Secondary | ICD-10-CM

## 2021-03-27 DIAGNOSIS — Z125 Encounter for screening for malignant neoplasm of prostate: Secondary | ICD-10-CM | POA: Diagnosis not present

## 2021-03-27 DIAGNOSIS — L649 Androgenic alopecia, unspecified: Secondary | ICD-10-CM | POA: Diagnosis not present

## 2021-03-27 DIAGNOSIS — Z0000 Encounter for general adult medical examination without abnormal findings: Secondary | ICD-10-CM

## 2021-03-27 DIAGNOSIS — F32A Depression, unspecified: Secondary | ICD-10-CM | POA: Diagnosis not present

## 2021-03-27 DIAGNOSIS — R739 Hyperglycemia, unspecified: Secondary | ICD-10-CM

## 2021-03-27 DIAGNOSIS — R69 Illness, unspecified: Secondary | ICD-10-CM | POA: Diagnosis not present

## 2021-03-27 DIAGNOSIS — Z9889 Other specified postprocedural states: Secondary | ICD-10-CM | POA: Insufficient documentation

## 2021-03-27 DIAGNOSIS — I4892 Unspecified atrial flutter: Secondary | ICD-10-CM

## 2021-03-27 LAB — COMPREHENSIVE METABOLIC PANEL
ALT: 18 U/L (ref 0–53)
AST: 21 U/L (ref 0–37)
Albumin: 4.2 g/dL (ref 3.5–5.2)
Alkaline Phosphatase: 62 U/L (ref 39–117)
BUN: 16 mg/dL (ref 6–23)
CO2: 29 mEq/L (ref 19–32)
Calcium: 9.3 mg/dL (ref 8.4–10.5)
Chloride: 103 mEq/L (ref 96–112)
Creatinine, Ser: 0.91 mg/dL (ref 0.40–1.50)
GFR: 86.35 mL/min (ref >60.00)
Glucose, Bld: 91 mg/dL (ref 70–99)
Potassium: 4.1 mEq/L (ref 3.5–5.1)
Sodium: 138 mEq/L (ref 135–145)
Total Bilirubin: 0.9 mg/dL (ref 0.2–1.2)
Total Protein: 6.4 g/dL (ref 6.0–8.3)

## 2021-03-27 LAB — LIPID PANEL
Cholesterol: 182 mg/dL (ref 0–200)
HDL: 52.9 mg/dL (ref >39.00)
LDL Cholesterol: 109 mg/dL — ABNORMAL HIGH (ref 0–99)
NonHDL: 128.66
Total CHOL/HDL Ratio: 3
Triglycerides: 98 mg/dL (ref 0.0–149.0)
VLDL: 19.6 mg/dL (ref 0.0–40.0)

## 2021-03-27 LAB — CBC WITH DIFFERENTIAL/PLATELET
Basophils Absolute: 0 10*3/uL (ref 0.0–0.1)
Basophils Relative: 0.6 % (ref 0.0–3.0)
Eosinophils Absolute: 0.1 10*3/uL (ref 0.0–0.7)
Eosinophils Relative: 2.1 % (ref 0.0–5.0)
HCT: 40.8 % (ref 39.0–52.0)
Hemoglobin: 13.6 g/dL (ref 13.0–17.0)
Lymphocytes Relative: 25.2 % (ref 12.0–46.0)
Lymphs Abs: 1.2 10*3/uL (ref 0.7–4.0)
MCHC: 33.2 g/dL (ref 30.0–36.0)
MCV: 95 fl (ref 78.0–100.0)
Monocytes Absolute: 0.5 10*3/uL (ref 0.1–1.0)
Monocytes Relative: 11 % (ref 3.0–12.0)
Neutro Abs: 2.9 10*3/uL (ref 1.4–7.7)
Neutrophils Relative %: 61.1 % (ref 43.0–77.0)
Platelets: 212 10*3/uL (ref 150.0–400.0)
RBC: 4.3 Mil/uL (ref 4.22–5.81)
RDW: 12.8 % (ref 11.5–15.5)
WBC: 4.7 10*3/uL (ref 4.0–10.5)

## 2021-03-27 LAB — HEMOGLOBIN A1C: Hgb A1c MFr Bld: 5.6 % (ref 4.6–6.5)

## 2021-03-27 LAB — TSH: TSH: 1.33 u[IU]/mL (ref 0.35–5.50)

## 2021-03-27 LAB — PSA, MEDICARE: PSA: 0.46 ng/ml (ref 0.10–4.00)

## 2021-03-27 NOTE — Assessment & Plan Note (Addendum)
Chronic Regular exercise and healthy diet encouraged Check lipid panel  - LDL goal < 100 Continue diet control

## 2021-03-27 NOTE — Assessment & Plan Note (Signed)
Chronic Following with cardiology On aspirin 81 mg daily, metoprolol 12.5 mg twice daily CBC, CMP

## 2021-03-27 NOTE — Assessment & Plan Note (Signed)
Chronic Continue Proscar 5 mg daily 

## 2021-03-27 NOTE — Assessment & Plan Note (Signed)
Chronic Controlled, Stable Continue fluoxetine 10 mg daily 

## 2021-04-05 ENCOUNTER — Other Ambulatory Visit: Payer: Self-pay | Admitting: Internal Medicine

## 2021-05-02 ENCOUNTER — Other Ambulatory Visit: Payer: Self-pay | Admitting: Internal Medicine

## 2021-05-15 ENCOUNTER — Telehealth: Payer: Self-pay | Admitting: Internal Medicine

## 2021-05-15 ENCOUNTER — Encounter: Payer: Self-pay | Admitting: Internal Medicine

## 2021-05-15 NOTE — Telephone Encounter (Signed)
Pt is getting a message that states possible afib when doing ekg on himself... please advise ?

## 2021-05-15 NOTE — Telephone Encounter (Signed)
Spoke with pt and yesterday early evening,a  little while later ,and this am has ran an EKG with [facility] and this is reading possible afib  Per pt no symptoms Pt did note loading equipment over the weekend   became a little "winded " Pt to send recordings via mychart for review Will forward message to Dr Gasper Sells for review and recommendations after getting recordings and rate was 82./cy ?

## 2021-05-17 NOTE — Telephone Encounter (Signed)
Left a message to call back. Would like to follow up with pt in regards to my chart message.  Pt recently started on metoprolol by PCP on 05/02/21. ?

## 2021-05-24 NOTE — Telephone Encounter (Signed)
Called pt to f/u previous call.  Pt reports no further [facility] readings of possible atrial fibrillation.  Readings are mostly unclassified.  Pt reports feel fine no palpitations.  Advised pt to send a my chart message if anything changes. Pt had no questions or concerns.   ?

## 2021-05-29 ENCOUNTER — Other Ambulatory Visit: Payer: Self-pay

## 2021-05-29 ENCOUNTER — Ambulatory Visit (AMBULATORY_SURGERY_CENTER): Payer: Medicare HMO

## 2021-05-29 VITALS — Ht 69.0 in | Wt 190.0 lb

## 2021-05-29 DIAGNOSIS — Z86010 Personal history of colonic polyps: Secondary | ICD-10-CM

## 2021-05-29 MED ORDER — PEG 3350-KCL-NA BICARB-NACL 420 G PO SOLR: 4000.0000 mL | Freq: Once | ORAL | 0 refills | Status: AC

## 2021-05-29 NOTE — Progress Notes (Signed)
?  Patient's pre-visit was done today over the phone with the patient  ? ?Name,DOB and address verified.  ?  ?Patient denies any allergies to Eggs and Soy.  ? ?Patient denies any problems with anesthesia/sedation. ? ?Patient denies taking diet pills or blood thinners.  ? ?Denies atrial flutter or atrial fib ? ?Denies chronic constipation ? ?No home Oxygen.  ? ?Packet of Prep instructions sent by My Chart or mail to patient including a copy of a consent form if by mail-pt is aware.  ? ?Patient understands to call us back with any questions or concerns.  ? ?Patient is aware of our care-partner policy and [id number]-70 safety protocol.  ? ?EMMI education assigned to the patient for the procedure, sent to [facility].  ? ?  ? ?  ?

## 2021-06-07 ENCOUNTER — Encounter: Payer: Self-pay | Admitting: Gastroenterology

## 2021-06-19 ENCOUNTER — Encounter: Payer: Medicare HMO | Admitting: Gastroenterology

## 2021-07-30 ENCOUNTER — Encounter: Payer: Self-pay | Admitting: Certified Registered Nurse Anesthetist

## 2021-08-02 ENCOUNTER — Ambulatory Visit (AMBULATORY_SURGERY_CENTER): Payer: Medicare HMO | Admitting: Gastroenterology

## 2021-08-02 ENCOUNTER — Encounter: Payer: Self-pay | Admitting: Gastroenterology

## 2021-08-02 VITALS — BP 118/62 | HR 57 | Temp 97.1°F | Resp 14 | Ht 69.0 in | Wt 190.0 lb

## 2021-08-02 DIAGNOSIS — D122 Benign neoplasm of ascending colon: Secondary | ICD-10-CM

## 2021-08-02 DIAGNOSIS — Z09 Encounter for follow-up examination after completed treatment for conditions other than malignant neoplasm: Secondary | ICD-10-CM

## 2021-08-02 DIAGNOSIS — Z86010 Personal history of colonic polyps: Secondary | ICD-10-CM | POA: Diagnosis not present

## 2021-08-02 DIAGNOSIS — D123 Benign neoplasm of transverse colon: Secondary | ICD-10-CM | POA: Diagnosis not present

## 2021-08-02 DIAGNOSIS — D125 Benign neoplasm of sigmoid colon: Secondary | ICD-10-CM | POA: Diagnosis not present

## 2021-08-02 MED ORDER — SODIUM CHLORIDE 0.9 % IV SOLN: 500.0000 mL | Freq: Once | INTRAVENOUS | Status: DC

## 2021-08-02 NOTE — Patient Instructions (Signed)
Handout on polyps given. ° °YOU HAD AN ENDOSCOPIC PROCEDURE TODAY AT THE [facility]:   Refer to the procedure report that was given to you for any specific questions about what was found during the examination.  If the procedure report does not answer your questions, please call your gastroenterologist to clarify.  If you requested that your care partner not be given the details of your procedure findings, then the procedure report has been included in a sealed envelope for you to review at your convenience later. ° °YOU SHOULD EXPECT: Some feelings of bloating in the abdomen. Passage of more gas than usual.  Walking can help get rid of the air that was put into your GI tract during the procedure and reduce the bloating. If you had a lower endoscopy (such as a colonoscopy or flexible sigmoidoscopy) you may notice spotting of blood in your stool or on the toilet paper. If you underwent a bowel prep for your procedure, you may not have a normal bowel movement for a few days. ° °Please Note:  You might notice some irritation and congestion in your nose or some drainage.  This is from the oxygen used during your procedure.  There is no need for concern and it should clear up in a day or so. ° °SYMPTOMS TO REPORT IMMEDIATELY: ° °Following lower endoscopy (colonoscopy or flexible sigmoidoscopy): ° Excessive amounts of blood in the stool ° Significant tenderness or worsening of abdominal pains ° Swelling of the abdomen that is new, acute ° Fever of 100°F or higher ° °For urgent or emergent issues, a gastroenterologist can be reached at any hour by calling [phone]. °Do not use MyChart messaging for urgent concerns.  ° ° °DIET:  We do recommend a small meal at first, but then you may proceed to your regular diet.  Drink plenty of fluids but you should avoid alcoholic beverages for 24 hours. ° °ACTIVITY:  You should plan to take it easy for the rest of today and you should NOT DRIVE or use heavy machinery  until tomorrow (because of the sedation medicines used during the test).   ° °FOLLOW UP: °Our staff will call the number listed on your records 48-72 hours following your procedure to check on you and address any questions or concerns that you may have regarding the information given to you following your procedure. If we do not reach you, we will leave a message.  We will attempt to reach you two times.  During this call, we will ask if you have developed any symptoms of COVID 19. If you develop any symptoms (ie: fever, flu-like symptoms, shortness of breath, cough etc.) before then, please call [phone].  If you test positive for Covid 19 in the 2 weeks post procedure, please call and report this information to us.   ° °If any biopsies were taken you will be contacted by phone or by letter within the next 1-3 weeks.  Please call us at [phone] if you have not heard about the biopsies in 3 weeks.  ° ° °SIGNATURES/CONFIDENTIALITY: °You and/or your care partner have signed paperwork which will be entered into your electronic medical record.  These signatures attest to the fact that that the information above on your After Visit Summary has been reviewed and is understood.  Full responsibility of the confidentiality of this discharge information lies with you and/or your care-partner.  °

## 2021-08-02 NOTE — Progress Notes (Signed)
[facility] History and Physical   Primary Care Physician:  Binnie Rail, MD   Reason for Procedure:   History of colon polyps  Plan:    colonoscopy     HPI: Donald Wilkins is a 71 y.o. male  here for colonoscopy surveillance - had advanced adenoma removed in 2010, last exam 2015 normal. Overdue for surveillance. Patient denies any bowel symptoms at this time. No family history of colon cancer known. Otherwise feels well without any cardiopulmonary symptoms.    Past Medical History:  Diagnosis Date   A-fib ([place])    Basal cell cancer    X 2;[facility]   Hyperlipidemia    Incomplete right bundle branch block    MVP (mitral valve prolapse)    on ECHO    Past Surgical History:  Procedure Laterality Date   ANAL FISSURE REPAIR     BASAL CELL CARCINOMA EXCISION  2011 & 2012   Mohs L face; back   CARDIOVERSION N/A 04/24/2020   Procedure: CARDIOVERSION;  Surgeon: Werner Lean, MD;  Location: [facility];  Service: Cardiovascular;  Laterality: N/A;   COLONOSCOPY     colonoscopy with polypectomy  03/05/2003   [facility], Dr Deatra Ina; negative 2010    Prior to Admission medications   Medication Sig Start Date End Date Taking? Authorizing Provider  aspirin EC 81 MG tablet Take 1 tablet (81 mg total) by mouth daily. Swallow whole. 01/09/21  Yes Chandrasekhar, Mahesh A, MD  finasteride (PROSCAR) 5 MG tablet TAKE 1/2 TABLET BY MOUTH EVERY 2 DAYS 03/22/19  Yes Burns, Claudina Lick, MD  FLUoxetine (PROZAC) 10 MG capsule TAKE 1 CAPSULE BY MOUTH EVERY DAY 04/05/21  Yes Burns, Claudina Lick, MD  Glucosamine-Chondroitin (OSTEO BI-FLEX REGULAR STRENGTH PO) Take 2 tablets by mouth daily.   Yes [provider]  ibuprofen (ADVIL) 200 MG tablet Take 400 mg by mouth every 8 (eight) hours as needed (pain.).   Yes [provider]  metoprolol tartrate (LOPRESSOR) 25 MG tablet TAKE 1/2 TABLET BY MOUTH TWICE A DAY 05/02/21  Yes Burns, Claudina Lick, MD  Multiple Vitamin (MULTIVITAMIN WITH  MINERALS) TABS tablet Take 1 tablet by mouth daily. Centrum Silver   Yes [provider]  Omega-3 Fatty Acids (SALMON OIL-1000 PO) Take 1,000 mg by mouth daily.   Yes [provider]  Turmeric (QC TUMERIC COMPLEX PO) Take by mouth.   Yes [provider]  diclofenac sodium (VOLTAREN) 1 % GEL Apply 4 g topically 4 (four) times daily. 04/06/18   Binnie Rail, MD  sildenafil (REVATIO) 20 MG tablet Take 2-5 pills as needed 03/22/19   Binnie Rail, MD    Current Outpatient Medications  Medication Sig Dispense Refill   aspirin EC 81 MG tablet Take 1 tablet (81 mg total) by mouth daily. Swallow whole. 90 tablet 3   finasteride (PROSCAR) 5 MG tablet TAKE 1/2 TABLET BY MOUTH EVERY 2 DAYS 90 tablet 3   FLUoxetine (PROZAC) 10 MG capsule TAKE 1 CAPSULE BY MOUTH EVERY DAY 90 capsule 3   Glucosamine-Chondroitin (OSTEO BI-FLEX REGULAR STRENGTH PO) Take 2 tablets by mouth daily.     ibuprofen (ADVIL) 200 MG tablet Take 400 mg by mouth every 8 (eight) hours as needed (pain.).     metoprolol tartrate (LOPRESSOR) 25 MG tablet TAKE 1/2 TABLET BY MOUTH TWICE A DAY 90 tablet 1   Multiple Vitamin (MULTIVITAMIN WITH MINERALS) TABS tablet Take 1 tablet by mouth daily. Centrum Silver  Omega-3 Fatty Acids (SALMON OIL-1000 PO) Take 1,000 mg by mouth daily.     Turmeric (QC TUMERIC COMPLEX PO) Take by mouth.     diclofenac sodium (VOLTAREN) 1 % GEL Apply 4 g topically 4 (four) times daily. 100 g 5   sildenafil (REVATIO) 20 MG tablet Take 2-5 pills as needed 40 tablet 5   Current Facility-Administered Medications  Medication Dose Route Frequency Provider Last Rate Last Admin   0.9 %  sodium chloride infusion  500 mL Intravenous Once Braylee Lal, Carlota Raspberry, MD        Allergies as of 08/02/2021   (No Known Allergies)    Family History  Problem Relation Age of Onset   Heart attack Mother 86   Diabetes Father    Lung cancer Father        smoker   Diabetes Sister    Hypertension Sister     Cancer Brother        malignant,brain stem   Heart attack Maternal Uncle        pre 78   Stomach cancer Neg Hx    Colon cancer Neg Hx    Esophageal cancer Neg Hx    Pancreatic cancer Neg Hx    Colon polyps Neg Hx    Rectal cancer Neg Hx     Social History   Socioeconomic History   Marital status: Married    Spouse name: Not on file   Number of children: Not on file   Years of education: Not on file   Highest education level: Not on file  Occupational History   Not on file  Tobacco Use   Smoking status: Former    Types: Cigarettes    Quit date: 03/05/1971    Years since quitting: 50.4   Smokeless tobacco: Never   Tobacco comments:    smoked  1970-1973, up to 1/2 ppd  Vaping Use   Vaping Use: Never used  Substance and Sexual Activity   Alcohol use: Yes    Comment: socially; 2-3 / week   Drug use: No   Sexual activity: Not on file  Other Topics Concern   Not on file  Social History Narrative   Exercise: walks, chops wood   Social Determinants of Health   Financial Resource Strain: Not on file  Food Insecurity: Not on file  Transportation Needs: Not on file  Physical Activity: Not on file  Stress: Not on file  Social Connections: Not on file  Intimate Partner Violence: Not on file    Review of Systems: All other review of systems negative except as mentioned in the HPI.  Physical Exam: Vital signs BP 127/76   Pulse 62   Temp (!) 97.1 F (36.2 C)   Resp 14   Ht '5\' 9"'$  (1.753 m)   Wt 190 lb (86.2 kg)   SpO2 100%   BMI 28.06 kg/m   General:   Alert,  Well-developed, pleasant and cooperative in NAD Lungs:  Clear throughout to auscultation.   Heart:  Regular rate and rhythm Abdomen:  Soft, nontender and nondistended.   Neuro/Psych:  Alert and cooperative. Normal mood and affect. A and O x 3  Jolly Mango, MD [facility]

## 2021-08-02 NOTE — Progress Notes (Signed)
Report given to PACU, vss 

## 2021-08-02 NOTE — Op Note (Signed)
Donald Wilkins: Donald Wilkins Procedure Date: 08/02/2021 1:26 PM MRN: [id number] Endoscopist: Donald Wilkins P. Havery Donald Wilkins , MD Age: 71 Referring MD:  Date of Birth: 06/27/1950 Gender: Male Account #: [id number] Procedure:                Colonoscopy Indications:              High risk colon cancer surveillance: Personal                            history of colonic polyps - history of advanced                            adenoma removed 2010, last exam 2015 normal Medicines:                Monitored Anesthesia Care Procedure:                Pre-Anesthesia Assessment:                           - Prior to the procedure, a History and Physical                            was performed, and patient medications and                            allergies were reviewed. The patient's tolerance of                            previous anesthesia was also reviewed. The risks                            and benefits of the procedure and the sedation                            options and risks were discussed with the patient.                            All questions were answered, and informed consent                            was obtained. Prior Anticoagulants: The patient has                            taken no previous anticoagulant or antiplatelet                            agents. ASA Grade Assessment: II - A patient with                            mild systemic disease. After reviewing the risks                            and benefits, the patient was deemed in  satisfactory condition to undergo the procedure.                           After obtaining informed consent, the colonoscope                            was passed under direct vision. Throughout the                            procedure, the patient's blood pressure, pulse, and                            oxygen saturations were monitored continuously. The                            Olympus CF-HQ190L  [id number]) Colonoscope was                            introduced through the anus and advanced to the the                            terminal ileum, with identification of the                            appendiceal orifice and IC valve. The colonoscopy                            was performed without difficulty. The patient                            tolerated the procedure well. The quality of the                            bowel preparation was adequate. The terminal ileum,                            ileocecal valve, appendiceal orifice, and rectum                            were photographed. Scope In: 1:32:17 PM Scope Out: 1:59:10 PM Scope Withdrawal Time: 0 hours 24 minutes 14 seconds  Total Procedure Duration: 0 hours 26 minutes 53 seconds  Findings:                 The perianal and digital rectal examinations were                            normal.                           The terminal ileum appeared normal.                           Three sessile polyps were found in the ascending  colon. The polyps were 3 to 4 mm in size. These                            polyps were removed with a cold snare. Resection                            and retrieval were complete.                           Three sessile polyps were found in the transverse                            colon. The polyps were 3 mm in size. These polyps                            were removed with a cold snare. Resection and                            retrieval were complete.                           A 3 mm polyp was found in the sigmoid colon. The                            polyp was sessile. The polyp was removed with a                            cold snare. Resection and retrieval were complete.                           Multiple small-mouthed diverticula were found in                            the left colon.                           Internal hemorrhoids were found during                             retroflexion. The hemorrhoids were moderate.                           The exam was otherwise without abnormality. The                            colon was spastic which prolonged the exam. Complications:            No immediate complications. Estimated blood loss:                            Minimal. Estimated Blood Loss:     Estimated blood loss was minimal. Impression:               - The examined portion of the ileum was normal.                           -  Three 3 to 4 mm polyps in the ascending colon,                            removed with a cold snare. Resected and retrieved.                           - Three 3 mm polyps in the transverse colon,                            removed with a cold snare. Resected and retrieved.                           - One 3 mm polyp in the sigmoid colon, removed with                            a cold snare. Resected and retrieved.                           - Diverticulosis in the left colon.                           - Internal hemorrhoids.                           - Spastic colon                           - The examination was otherwise normal. Recommendation:           - Patient has a contact number available for                            emergencies. The signs and symptoms of potential                            delayed complications were discussed with the                            patient. Return to normal activities tomorrow.                            Written discharge instructions were provided to the                            patient.                           - Resume previous diet.                           - Continue present medications.                           - Await pathology results. Donald Wilkins P. Yonna Alwin, MD 08/02/2021 2:04:42 PM This report has been signed electronically.

## 2021-08-02 NOTE — Progress Notes (Signed)
Called to room to assist during endoscopic procedure.  Patient ID and intended procedure confirmed with present staff. Received instructions for my participation in the procedure from the performing physician.  

## 2021-08-02 NOTE — Progress Notes (Signed)
Pt's states no medical or surgical changes since previsit or office visit. 

## 2021-08-03 ENCOUNTER — Telehealth: Payer: Self-pay

## 2021-08-03 NOTE — Telephone Encounter (Signed)
  Follow up Call-     08/02/2021    1:09 PM  Call back number  Post procedure Call Back phone  # [phone]  Permission to leave phone message Yes     Patient questions:  Do you have a fever, pain , or abdominal swelling? No. Pain Score  0 *  Have you tolerated food without any problems? Yes.    Have you been able to return to your normal activities? Yes.    Do you have any questions about your discharge instructions: Diet   No. Medications  No. Follow up visit  No.  Do you have questions or concerns about your Care? No.  Actions: * If pain score is 4 or above: No action needed, pain <4.

## 2021-10-25 ENCOUNTER — Other Ambulatory Visit: Payer: Self-pay | Admitting: Internal Medicine

## 2021-11-20 DIAGNOSIS — Z85828 Personal history of other malignant neoplasm of skin: Secondary | ICD-10-CM | POA: Diagnosis not present

## 2021-11-20 DIAGNOSIS — D2261 Melanocytic nevi of right upper limb, including shoulder: Secondary | ICD-10-CM | POA: Diagnosis not present

## 2021-11-20 DIAGNOSIS — D2271 Melanocytic nevi of right lower limb, including hip: Secondary | ICD-10-CM | POA: Diagnosis not present

## 2021-11-20 DIAGNOSIS — D225 Melanocytic nevi of trunk: Secondary | ICD-10-CM | POA: Diagnosis not present

## 2021-11-20 DIAGNOSIS — D1801 Hemangioma of skin and subcutaneous tissue: Secondary | ICD-10-CM | POA: Diagnosis not present

## 2021-11-20 DIAGNOSIS — D2239 Melanocytic nevi of other parts of face: Secondary | ICD-10-CM | POA: Diagnosis not present

## 2021-11-20 DIAGNOSIS — L820 Inflamed seborrheic keratosis: Secondary | ICD-10-CM | POA: Diagnosis not present

## 2021-11-20 DIAGNOSIS — L821 Other seborrheic keratosis: Secondary | ICD-10-CM | POA: Diagnosis not present

## 2021-11-20 DIAGNOSIS — D2272 Melanocytic nevi of left lower limb, including hip: Secondary | ICD-10-CM | POA: Diagnosis not present

## 2021-11-20 DIAGNOSIS — L570 Actinic keratosis: Secondary | ICD-10-CM | POA: Diagnosis not present

## 2021-11-20 DIAGNOSIS — L814 Other melanin hyperpigmentation: Secondary | ICD-10-CM | POA: Diagnosis not present

## 2021-12-18 DIAGNOSIS — H524 Presbyopia: Secondary | ICD-10-CM | POA: Diagnosis not present

## 2021-12-18 DIAGNOSIS — H25813 Combined forms of age-related cataract, bilateral: Secondary | ICD-10-CM | POA: Diagnosis not present

## 2021-12-18 DIAGNOSIS — H52203 Unspecified astigmatism, bilateral: Secondary | ICD-10-CM | POA: Diagnosis not present

## 2021-12-18 DIAGNOSIS — H5213 Myopia, bilateral: Secondary | ICD-10-CM | POA: Diagnosis not present

## 2022-01-28 ENCOUNTER — Encounter: Payer: Self-pay | Admitting: Internal Medicine

## 2022-02-01 ENCOUNTER — Other Ambulatory Visit: Payer: Self-pay | Admitting: Internal Medicine

## 2022-02-04 ENCOUNTER — Telehealth: Payer: Self-pay | Admitting: Internal Medicine

## 2022-02-04 ENCOUNTER — Other Ambulatory Visit: Payer: Self-pay

## 2022-02-04 MED ORDER — FINASTERIDE 5 MG PO TABS: ORAL_TABLET | ORAL | 3 refills | Status: DC

## 2022-02-04 NOTE — Telephone Encounter (Signed)
Sent in today 

## 2022-02-04 NOTE — Telephone Encounter (Signed)
Patient needs his finasteride refilled - please send to [facility] on [address], [place], [place]  Next appt. January, 2024

## 2022-02-06 DIAGNOSIS — H2511 Age-related nuclear cataract, right eye: Secondary | ICD-10-CM | POA: Diagnosis not present

## 2022-02-06 DIAGNOSIS — H25811 Combined forms of age-related cataract, right eye: Secondary | ICD-10-CM | POA: Diagnosis not present

## 2022-02-06 DIAGNOSIS — H25012 Cortical age-related cataract, left eye: Secondary | ICD-10-CM | POA: Diagnosis not present

## 2022-02-06 DIAGNOSIS — H2512 Age-related nuclear cataract, left eye: Secondary | ICD-10-CM | POA: Diagnosis not present

## 2022-02-06 NOTE — Patient Instructions (Signed)
Health Maintenance, Male Adopting a healthy lifestyle and getting preventive care are important in promoting health and wellness. Ask your health care provider about: The right schedule for you to have regular tests and exams. Things you can do on your own to prevent diseases and keep yourself healthy. What should I know about diet, weight, and exercise? Eat a healthy diet  Eat a diet that includes plenty of vegetables, fruits, low-fat dairy products, and lean protein. Do not eat a lot of foods that are high in solid fats, added sugars, or sodium. Maintain a healthy weight Body mass index (BMI) is a measurement that can be used to identify possible weight problems. It estimates body fat based on height and weight. Your health care provider can help determine your BMI and help you achieve or maintain a healthy weight. Get regular exercise Get regular exercise. This is one of the most important things you can do for your health. Most adults should: Exercise for at least 150 minutes each week. The exercise should increase your heart rate and make you sweat (moderate-intensity exercise). Do strengthening exercises at least twice a week. This is in addition to the moderate-intensity exercise. Spend less time sitting. Even light physical activity can be beneficial. Watch cholesterol and blood lipids Have your blood tested for lipids and cholesterol at 69 years of age, then have this test every 5 years. You may need to have your cholesterol levels checked more often if: Your lipid or cholesterol levels are high. You are older than 69 years of age. You are at high risk for heart disease. What should I know about cancer screening? Many types of cancers can be detected early and may often be prevented. Depending on your health history and family history, you may need to have cancer screening at various ages. This may include screening for: Colorectal cancer. Prostate cancer. Skin cancer. Lung  cancer. What should I know about heart disease, diabetes, and high blood pressure? Blood pressure and heart disease High blood pressure causes heart disease and increases the risk of stroke. This is more likely to develop in people who have high blood pressure readings or are overweight. Talk with your health care provider about your target blood pressure readings. Have your blood pressure checked: Every 3-5 years if you are 18-39 years of age. Every year if you are 40 years old or older. If you are between the ages of 65 and 75 and are a current or former smoker, ask your health care provider if you should have a one-time screening for abdominal aortic aneurysm (AAA). Diabetes Have regular diabetes screenings. This checks your fasting blood sugar level. Have the screening done: Once every three years after age 45 if you are at a normal weight and have a low risk for diabetes. More often and at a younger age if you are overweight or have a high risk for diabetes. What should I know about preventing infection? Hepatitis B If you have a higher risk for hepatitis B, you should be screened for this virus. Talk with your health care provider to find out if you are at risk for hepatitis B infection. Hepatitis C Blood testing is recommended for: Everyone born from 1945 through 1965. Anyone with known risk factors for hepatitis C. Sexually transmitted infections (STIs) You should be screened each year for STIs, including gonorrhea and chlamydia, if: You are sexually active and are younger than 69 years of age. You are older than 69 years of age and your   health care provider tells you that you are at risk for this type of infection. Your sexual activity has changed since you were last screened, and you are at increased risk for chlamydia or gonorrhea. Ask your health care provider if you are at risk. Ask your health care provider about whether you are at high risk for HIV. Your health care provider  may recommend a prescription medicine to help prevent HIV infection. If you choose to take medicine to prevent HIV, you should first get tested for HIV. You should then be tested every 3 months for as long as you are taking the medicine. Follow these instructions at home: Alcohol use Do not drink alcohol if your health care provider tells you not to drink. If you drink alcohol: Limit how much you have to 0-2 drinks a day. Know how much alcohol is in your drink. In the [place] one drink equals one 12 oz bottle of beer (355 mL), one 5 oz glass of Uriel Horkey (148 mL), or one 1 oz glass of hard liquor (44 mL). Lifestyle Do not use any products that contain nicotine or tobacco. These products include cigarettes, chewing tobacco, and vaping devices, such as e-cigarettes. If you need help quitting, ask your health care provider. Do not use street drugs. Do not share needles. Ask your health care provider for help if you need support or information about quitting drugs. General instructions Schedule regular health, dental, and eye exams. Stay current with your vaccines. Tell your health care provider if: You often feel depressed. You have ever been abused or do not feel safe at home. Summary Adopting a healthy lifestyle and getting preventive care are important in promoting health and wellness. Follow your health care provider's instructions about healthy diet, exercising, and getting tested or screened for diseases. Follow your health care provider's instructions on monitoring your cholesterol and blood pressure. This information is not intended to replace advice given to you by your health care provider. Make sure you discuss any questions you have with your health care provider. Document Revised: 07/10/2020 Document Reviewed: 07/10/2020 Elsevier Patient Education  2023 Elsevier Inc.  

## 2022-02-06 NOTE — Progress Notes (Unsigned)
Subjective:   Donald Wilkins is a 71 y.o. male who presents for Medicare Annual/Subsequent preventive examination. I connected with  Annabell Howells on 02/07/22 by a audio enabled telemedicine application and verified that I am speaking with the correct person using two identifiers.  Patient Location: Home  Provider Location: Home Office  I discussed the limitations of evaluation and management by telemedicine. The patient expressed understanding and agreed to proceed.  Review of Systems    Deferred to PCP Cardiac Risk Factors include: advanced age (>67mn, >>76women);male gender     Objective:    There were no vitals filed for this visit. There is no height or weight on file to calculate BMI.     02/07/2022    1:23 PM 04/24/2020   10:13 AM 09/16/2019   12:47 PM  Advanced Directives  Does Patient Have a Medical Advance Directive? No No No  Would patient like information on creating a medical advance directive? No - Patient declined No - Patient declined No - Patient declined    Current Medications (verified) Outpatient Encounter Medications as of 02/07/2022  Medication Sig   aspirin EC 81 MG tablet Take 1 tablet (81 mg total) by mouth daily. Swallow whole.   diclofenac sodium (VOLTAREN) 1 % GEL Apply 4 g topically 4 (four) times daily.   finasteride (PROSCAR) 5 MG tablet TAKE 1/2 TABLET BY MOUTH EVERY 2 DAYS   FLUoxetine (PROZAC) 10 MG capsule TAKE 1 CAPSULE BY MOUTH EVERY DAY Annual appt due in Jan must see provider for future refills   Glucosamine-Chondroitin (OSTEO BI-FLEX REGULAR STRENGTH PO) Take 2 tablets by mouth daily.   ibuprofen (ADVIL) 200 MG tablet Take 400 mg by mouth every 8 (eight) hours as needed (pain.).   metoprolol tartrate (LOPRESSOR) 25 MG tablet Take 0.5 tablets (12.5 mg total) by mouth 2 (two) times daily. Annual appt due in Jan must see provider for future refills   Multiple Vitamin (MULTIVITAMIN WITH MINERALS) TABS tablet Take 1 tablet by mouth daily. Centrum  Silver   Omega-3 Fatty Acids (SALMON OIL-1000 PO) Take 1,000 mg by mouth daily.   sildenafil (REVATIO) 20 MG tablet Take 2-5 pills as needed   Turmeric (QC TUMERIC COMPLEX PO) Take by mouth.   No facility-administered encounter medications on file as of 02/07/2022.    Allergies (verified) Patient has no known allergies.   History: Past Medical History:  Diagnosis Date   A-fib ([place]    Basal cell cancer    X 2;[facility]   Hyperlipidemia    Incomplete right bundle branch block    MVP (mitral valve prolapse)    on ECHO   Past Surgical History:  Procedure Laterality Date   ANAL FISSURE REPAIR     BASAL CELL CARCINOMA EXCISION  2011 & 2012   Mohs L face; back   CARDIOVERSION N/A 04/24/2020   Procedure: CARDIOVERSION;  Surgeon: CWerner Lean MD;  [facility];  Service: Cardiovascular;  Laterality: N/A;   COLONOSCOPY     colonoscopy with polypectomy  03/05/2003   [facility], Dr KDeatra Ina negative 2010   Family History  Problem Relation Age of Onset   Heart attack Mother 654  Diabetes Father    Lung cancer Father        smoker   Diabetes Sister    Hypertension Sister    Cancer Brother        malignant,brain stem   Heart attack Maternal Uncle  pre 55   Stomach cancer Neg Hx    Colon cancer Neg Hx    Esophageal cancer Neg Hx    Pancreatic cancer Neg Hx    Colon polyps Neg Hx    Rectal cancer Neg Hx    Social History   Socioeconomic History   Marital status: Married    Spouse name: Benjamine Mola   Number of children: Not on file   Years of education: Not on file   Highest education level: Not on file  Occupational History   Not on file  Tobacco Use   Smoking status: Former    Types: Cigarettes    Quit date: 03/05/1971    Years since quitting: 50.9   Smokeless tobacco: Never   Tobacco comments:    smoked  1970-1973, up to 1/2 ppd  Vaping Use   Vaping Use: Never used  Substance and Sexual Activity   Alcohol use: Yes    Comment: socially;  2-3 / week   Drug use: No   Sexual activity: Not on file  Other Topics Concern   Not on file  Social History Narrative   Exercise: walks, chops wood   Social Determinants of Health   Financial Resource Strain: Low Risk  (02/07/2022)   Overall Financial Resource Strain (CARDIA)    Difficulty of Paying Living Expenses: Not hard at all  Food Insecurity: No Food Insecurity (02/07/2022)   Hunger Vital Sign    Worried About Running Out of Food in the Last Year: Never true    [place] in the Last Year: Never true  Transportation Needs: No Transportation Needs (02/07/2022)   PRAPARE - Hydrologist (Medical): No    Lack of Transportation (Non-Medical): No  Physical Activity: Sufficiently Active (02/07/2022)   Exercise Vital Sign    Days of Exercise per Week: 5 days    Minutes of Exercise per Session: 30 min  Stress: No Stress Concern Present (02/07/2022)   [place]    Feeling of Stress : Not at all  Social Connections: [place] (02/07/2022)   Social Connection and Isolation Panel [NHANES]    Frequency of Communication with Friends and Family: More than three times a week    Frequency of Social Gatherings with Friends and Family: More than three times a week    Attends Religious Services: More than 4 times per year    Active Member of [employer] or Organizations: Yes    Attends Music therapist: More than 4 times per year    Marital Status: Married    Tobacco Counseling Counseling given: Not Answered Tobacco comments: smoked  1970-1973, up to 1/2 ppd   Clinical Intake:  Pre-visit preparation completed: Yes  Pain : No/denies pain     Nutritional Status: BMI 25 -29 Overweight Nutritional Risks: None Diabetes: No  How often do you need to have someone help you when you read instructions, pamphlets, or other written materials from your doctor or pharmacy?: 1 -  Never  Diabetic?No     Information entered by :: Emelia Loron RN   Activities of Daily Living    02/07/2022    1:23 PM  In your present state of health, do you have any difficulty performing the following activities:  Hearing? 0  Vision? 0  Difficulty concentrating or making decisions? 0  Walking or climbing stairs? 0  Dressing or bathing? 0  Doing errands, shopping? 0  Preparing  Food and eating ? N  Using the Toilet? N  In the past six months, have you accidently leaked urine? N  Do you have problems with loss of bowel control? N  Managing your Medications? N  Managing your Finances? N  Housekeeping or managing your Housekeeping? N    Patient Care Team: Binnie Rail, MD as PCP - General (Internal Medicine) Werner Lean, MD as PCP - Cardiology (Cardiology) Rutherford Guys, MD as Consulting Physician (Ophthalmology) Sydnee Levans, MD as Referring Physician (Dermatology)  Indicate any recent Medical Services you may have received from other than [facility] providers in the past year (date may be approximate).     Assessment:   This is a routine wellness examination for Jshawn.  Hearing/Vision screen No results found.  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      Depression Screen    02/07/2022    1:22 PM 03/27/2021    9:50 AM 09/16/2019   12:48 PM 03/22/2019    8:57 AM 03/18/2018    8:12 AM 02/28/2017   12:35 PM 10/14/2012    9:36 AM  PHQ 2/9 Scores  PHQ - 2 Score 0 0 0 0 0 0 0  PHQ- 9 Score  1  1  0     Fall Risk    02/07/2022    1:33 PM 03/27/2021    9:02 AM 09/16/2019   12:47 PM 03/22/2019    8:11  AM 03/18/2018    8:12 AM  [place] in the past year? 0 1 0 0 0  Number falls in past yr: 0 0 0 0 0  Injury with Fall? 0 1 0    Risk for fall due to : No Fall Risks No Fall Risks No Fall Risks    Follow up Falls evaluation completed Falls evaluation completed Falls evaluation completed      FALL RISK PREVENTION PERTAINING TO THE HOME:  Any stairs in or around the home? Yes  If so, are there any without handrails? Yes  Home free of loose throw rugs in walkways, pet beds, electrical cords, etc? Yes  Adequate lighting in your home to reduce risk of falls? Yes   ASSISTIVE DEVICES UTILIZED TO PREVENT FALLS:  Life alert? No  Use of a cane, walker or w/c? No  Grab bars in the bathroom? No  Shower chair or bench in shower? No  Elevated toilet seat or a handicapped toilet? No   Cognitive Function:        Immunizations Immunization History  Administered Date(s) Administered   Fluad Quad(high Dose 65+) 12/02/2018   Influenza, High Dose Seasonal PF 02/05/2018, 12/27/2019, 02/05/2021   Influenza,inj,Quad PF,6+ Mos 03/21/2015, 02/27/2016, 02/28/2017   PFIZER Comirnaty(Gray Top)Covid-19 Tri-Sucrose Vaccine 09/23/2020   PFIZER(Purple Top)SARS-COV-2 Vaccination 04/10/2019, 05/04/2019, 11/25/2019   Pfizer Covid-19 Vaccine Bivalent Booster 64yr & up 02/05/2021   Pneumococcal Conjugate-13 03/18/2018   Pneumococcal Polysaccharide-23 07/01/2019   Td 05/28/2007   Tdap 12/02/2018   Zoster Recombinat (Shingrix) 07/12/2019, 09/10/2021   Zoster, Live 03/21/2015    TDAP status: Up to date  Flu Vaccine status: Due, Education  has been provided regarding the importance of this vaccine. Advised may receive this vaccine at local pharmacy or Health Dept. Aware to provide a copy of the vaccination record if obtained from local pharmacy or Health Dept. Verbalized acceptance and understanding.  Pneumococcal vaccine status: Up to date  Covid-19 vaccine status: Information provided on how to  obtain vaccines.   Qualifies for Shingles Vaccine? Yes   Zostavax completed No   Shingrix Completed?: Yes  Screening Tests Health Maintenance  Topic Date Due   COVID-19 Vaccine (6 - 2023-24 season) 11/02/2021   INFLUENZA VACCINE  06/02/2022 (Originally 10/02/2021)   Medicare Annual Wellness (AWV)  02/08/2023   COLONOSCOPY (Pts 45-71yr Insurance coverage will need to be confirmed)  08/02/2024   DTaP/Tdap/Td (3 - Td or Tdap) 12/01/2028   Pneumonia Vaccine 69 Years old  Completed   Hepatitis C Screening  Completed   Zoster Vaccines- Shingrix  Completed   HPV VACCINES  Aged Out    Health Maintenance  Health Maintenance Due  Topic Date Due   COVID-19 Vaccine (6 - 2023-24 season) 11/02/2021    Colorectal cancer screening: Type of screening: Colonoscopy. Completed 08/02/21. Repeat every 3 years  Lung Cancer Screening: (Low Dose CT Chest recommended if Age 69-80years, 30 pack-year currently smoking OR have quit w/in 15years.) does not qualify.   Additional Screening:  Hepatitis C Screening: does qualify; Completed 02/27/16  Vision Screening: Recommended annual ophthalmology exams for early detection of glaucoma and other disorders of the eye. Is the patient up to date with their annual eye exam?  Yes  Who is the provider or what is the name of the office in which the patient attends annual eye exams? 12/18/21 Dr. SGershon CraneIf pt is not established with a provider, would they like to be referred to a provider to establish care?  N/A .   Dental Screening: Recommended annual dental exams for proper oral hygiene  Community Resource Referral / Chronic Care Management: CRR required this visit?  No   CCM required this visit?  No      Plan:     I have personally reviewed and noted the following in the patient's chart:   Medical and social history Use of alcohol, tobacco or illicit drugs  Current medications and supplements including opioid prescriptions. Patient is not currently  taking opioid prescriptions. Functional ability and status Nutritional status Physical activity Advanced directives List of other physicians Hospitalizations, surgeries, and ER visits in previous 12 months Vitals Screenings to include cognitive, depression, and falls Referrals and appointments  In addition, I have reviewed and discussed with patient certain preventive protocols, quality metrics, and best practice recommendations. A written personalized care plan for preventive services as well as general preventive health recommendations were provided to patient.     JMichiel Cowboy RN   02/07/2022   Nurse Notes:  Mr. PTedesco, Thank you for taking time to come for your Medicare Wellness Visit. I appreciate your ongoing commitment to your health goals. Please review the following plan we discussed and let me know if I can assist you in the future.   These are the goals we discussed:  Goals       Patient Stated     Client understands the importance of follow-up with providers by attending scheduled visits (pt-stated)      I would like to lose around 5-10 pounds.      Other     Patient Stated      Maintain current  health status.        This is a list of the screening recommended for you and due dates:  Health Maintenance  Topic Date Due   COVID-19 Vaccine (6 - 2023-24 season) 11/02/2021   Flu Shot  06/02/2022*   Medicare Annual Wellness Visit  02/08/2023   Colon Cancer Screening  08/02/2024   DTaP/Tdap/Td vaccine (3 - Td or Tdap) 12/01/2028   Pneumonia Vaccine  Completed   Hepatitis C Screening: USPSTF Recommendation to screen - Ages 65-79 yo.  Completed   Zoster (Shingles) Vaccine  Completed   HPV Vaccine  Aged Out  *Topic was postponed. The date shown is not the original due date.

## 2022-02-07 ENCOUNTER — Ambulatory Visit (INDEPENDENT_AMBULATORY_CARE_PROVIDER_SITE_OTHER): Payer: Medicare HMO | Admitting: *Deleted

## 2022-02-07 DIAGNOSIS — Z0000 Encounter for general adult medical examination without abnormal findings: Secondary | ICD-10-CM | POA: Diagnosis not present

## 2022-02-12 NOTE — Progress Notes (Unsigned)
Cardiology Office Note:    Date:  02/13/2022   ID:  Donald Wilkins, DOB [birth year], MRN [id number]  PCP:  Binnie Rail, MD  [facility] Cardiologist:  Werner Lean, MD  [facility] Electrophysiologist:  None   CC: AFL  History of Present Illness:    Donald Wilkins is a 71 y.o. male with a hx of Atrial Flutter, HLD, MVP who presents for evaluation 03/08/20.  In interim of this visit, patient had successful DCCV 04/24/20.  In interim of this visit, patient had surgery for his R arm in June. Seen 01/09/21. No further ALF.  Given low CHADSVASC only on ASA; his sx in AFL was DOE.  Patient notes that he is doing well.   No more palpitations or shortness of breath. Rare DOE going up stairs. There are no interval hospital/ED visit.    No chest pain or pressure .  No SOB/DOE and no PND/Orthopnea.  No weight gain or leg swelling.  No palpitations or syncope .  Rare PACs    Past Medical History:  Diagnosis Date   A-fib ([place])    Basal cell cancer    X 2;[facility]   Hyperlipidemia    Incomplete right bundle branch block    MVP (mitral valve prolapse)    on ECHO    Past Surgical History:  Procedure Laterality Date   ANAL FISSURE REPAIR     BASAL CELL CARCINOMA EXCISION  2011 & 2012   Mohs L face; back   CARDIOVERSION N/A 04/24/2020   Procedure: CARDIOVERSION;  Surgeon: Werner Lean, MD;  Location: [place];  Service: Cardiovascular;  Laterality: N/A;   COLONOSCOPY     colonoscopy with polypectomy  03/05/2003    GI, Dr Deatra Ina; negative 2010    Current Medications: Current Meds  Medication Sig   aspirin EC 81 MG tablet Take 1 tablet (81 mg total) by mouth daily. Swallow whole.   diclofenac sodium (VOLTAREN) 1 % GEL Apply 4 g topically 4 (four) times daily.   finasteride (PROSCAR) 5 MG tablet TAKE 1/2 TABLET BY MOUTH EVERY 2 DAYS   FLUoxetine (PROZAC) 10 MG capsule TAKE 1 CAPSULE BY MOUTH EVERY DAY Annual appt due in Jan must see provider for future  refills   Glucosamine-Chondroitin (OSTEO BI-FLEX REGULAR STRENGTH PO) Take 2 tablets by mouth daily.   ibuprofen (ADVIL) 200 MG tablet Take 400 mg by mouth every 8 (eight) hours as needed (pain.).   ketorolac (ACULAR) 0.5 % ophthalmic solution as directed.   metoprolol tartrate (LOPRESSOR) 25 MG tablet Take 0.5 tablets (12.5 mg total) by mouth 2 (two) times daily. Annual appt due in Jan must see provider for future refills   Multiple Vitamin (MULTIVITAMIN WITH MINERALS) TABS tablet Take 1 tablet by mouth daily. Centrum Silver   ofloxacin (OCUFLOX) 0.3 % ophthalmic solution as directed.   Omega-3 Fatty Acids (SALMON OIL-1000 PO) Take 1,000 mg by mouth daily.   prednisoLONE acetate (PRED FORTE) 1 % ophthalmic suspension as directed.   sildenafil (REVATIO) 20 MG tablet Take 2-5 pills as needed   Turmeric (QC TUMERIC COMPLEX PO) Take by mouth.     Allergies:   Patient has no known allergies.   Social History   Socioeconomic History   Marital status: Married    Spouse name: Benjamine Mola   Number of children: Not on file   Years of education: Not on file   Highest education level: Not on file  Occupational History   Not on  file  Tobacco Use   Smoking status: Former    Types: Cigarettes    Quit date: 03/05/1971    Years since quitting: 50.9   Smokeless tobacco: Never   Tobacco comments:    smoked  1970-1973, up to 1/2 ppd  Vaping Use   Vaping Use: Never used  Substance and Sexual Activity   Alcohol use: Yes    Comment: socially; 2-3 / week   Drug use: No   Sexual activity: Not on file  Other Topics Concern   Not on file  Social History Narrative   Exercise: walks, chops wood   Social Determinants of Health   Financial Resource Strain: Low Risk  (02/07/2022)   Overall Financial Resource Strain (CARDIA)    Difficulty of Paying Living Expenses: Not hard at all  Food Insecurity: No Food Insecurity (02/07/2022)   Hunger Vital Sign    Worried About Running Out of Food in the Last  Year: Never true    Ran Out of Food in the Last Year: Never true  Transportation Needs: No Transportation Needs (02/07/2022)   PRAPARE - Hydrologist (Medical): No    Lack of Transportation (Non-Medical): No  Physical Activity: Sufficiently Active (02/07/2022)   Exercise Vital Sign    Days of Exercise per Week: 5 days    Minutes of Exercise per Session: 30 min  Stress: No Stress Concern Present (02/07/2022)   [place]    Feeling of Stress : Not at all  Social Connections: [place] (02/07/2022)   Social Connection and Isolation Panel [NHANES]    Frequency of Communication with Friends and Family: More than three times a week    Frequency of Social Gatherings with Friends and Family: More than three times a week    Attends Religious Services: More than 4 times per year    Active Member of [employer] or Organizations: Yes    Attends Music therapist: More than 4 times per year    Marital Status: Married    Social:  Drummer in the band: [employer], Married, Decrease alcohol post AFl Dx  Family History: The patient's family history includes Cancer in his brother; Diabetes in his father and sister; Heart attack in his maternal uncle; Heart attack (age of onset: 62) in his mother; Hypertension in his sister; Lung cancer in his father. There is no history of Stomach cancer, Colon cancer, Esophageal cancer, Pancreatic cancer, Colon polyps, or Rectal cancer.   History of coronary artery disease notable for mother and uncle. History of heart failure notable for no members. History of arrhythmia notable for no members. No history of bicuspid aortic valve or aortic aneurysm or dissection.  ROS:   Please see the history of present illness.     All other systems reviewed and are negative.  EKGs/Labs/Other Studies Reviewed:    The following studies were reviewed today:  EKG:    02/13/22: SR with PACs 01/09/21:  Sinus rhythm rate 80 with with PACs  06/09/20: Sinus rhythm with PACs 76 04/24/20:  Sinus Bradycardia  Rate 50 with PVCs 03/08/20: Atrial Flutter 87 02/17/20: AFl rate 98   Transthoracic Echocardiogram: Date: 03/27/20 Results:  1. Left ventricular ejection fraction, by estimation, is 60 to 65%. The  left ventricle has normal function. The left ventricle has no regional  wall motion abnormalities. Left ventricular diastolic function could not  be evaluated.   2. Right ventricular systolic function is  normal. The right ventricular  size is normal. There is normal pulmonary artery systolic pressure. The  estimated right ventricular systolic pressure is 67.3 mmHg.   3. Left atrial size was moderately dilated.   4. The mitral valve is normal in structure. Mild mitral valve  regurgitation. No evidence of mitral stenosis.   5. Tricuspid valve regurgitation is mild to moderate.   6. The aortic valve is normal in structure. Aortic valve regurgitation is  trivial. No aortic stenosis is present.   7. The inferior vena cava is normal in size with greater than 50%  respiratory variability, suggesting right atrial pressure of 3 mmHg.    Recent Labs: 03/27/2021: ALT 18; BUN 16; Creatinine, Ser 0.91; Hemoglobin 13.6; Platelets 212.0; Potassium 4.1; Sodium 138; TSH 1.33  Recent Lipid Panel    Component Value Date/Time   CHOL 182 03/27/2021 0953   TRIG 98.0 03/27/2021 0953   HDL 52.90 03/27/2021 0953   CHOLHDL 3 03/27/2021 0953   VLDL 19.6 03/27/2021 0953   LDLCALC 109 (H) 03/27/2021 0953     Physical Exam:    VS:  BP 128/68   Pulse 69   Ht '5\' 10"'$  (1.778 m)   Wt 203 lb (92.1 kg)   SpO2 97%   BMI 29.13 kg/m     Wt Readings from Last 3 Encounters:  02/13/22 203 lb (92.1 kg)  08/02/21 190 lb (86.2 kg)  05/29/21 190 lb (86.2 kg)    Gen: no distress, well nourished   Neck: No JVD Cardiac: No Rubs or Gallops, no murmur, IRIR, no radial pulses Respiratory:  Clear to auscultation bilaterally, normal effort, normal  respiratory rate GI: Soft, nontender, non-distended  MS: No  edema;  moves all extremities; good ROM in both arms Integument: Skin feels warm Neuro:  At time of evaluation, alert and oriented to person/place/time/situation  Psych: Normal affect, patient feels   ASSESSMENT:    1. Paroxysmal atrial flutter ([place])   2. Atrial flutter, unspecified type ([place])   3. MVP (mitral valve prolapse)   4. Mixed hyperlipidemia     PLAN:    Paroxysmal Atrial Flutter PACs Mild MR- echo in 2025 unless new sx - Risk factors include age - ASA 81 mg PO daily; we have discussed that new diagnoses may prompt repeat monitor OR DOAC - discussed AFL triggers - heart rates are low and given his rare PACs, would continue tartrate 12.5 mg PO BID  Hyperlipidemia (mixed) -LDL goal less than 100 - gave education on dietary changes, rechecking in one year  Will plan for one year follow up unless new symptoms or abnormal test results warranting change in plan     Medication Adjustments/Labs and Tests Ordered: Current medicines are reviewed at length with the patient today.  Concerns regarding medicines are outlined above.  Orders Placed This Encounter  Procedures   EKG 12-Lead    No orders of the defined types were placed in this encounter.    Patient Instructions  Medication Instructions:  Your physician recommends that you continue on your current medications as directed. Please refer to the Current Medication list given to you today.  *If you need a refill on your cardiac medications before your next appointment, please call your pharmacy*  Follow-Up: At [facility], you and your health needs are our priority.  As part of our continuing mission to provide you with exceptional heart care, we have created designated Provider Care Teams.  These Care Teams include your primary Cardiologist (physician)  and Advanced Practice Providers  (APPs -  Physician Assistants and Nurse Practitioners) who all work together to provide you with the care you need, when you need it.   Your next appointment:   1 year(s)  The format for your next appointment:   In Person  Provider:   Werner Lean, MD    Important Information About Sugar         Signed, Werner Lean, MD  02/13/2022 12:06 PM    [place]

## 2022-02-13 ENCOUNTER — Encounter: Payer: Self-pay | Admitting: Internal Medicine

## 2022-02-13 ENCOUNTER — Ambulatory Visit: Payer: Medicare HMO | Attending: Internal Medicine | Admitting: Internal Medicine

## 2022-02-13 VITALS — BP 128/68 | HR 69 | Ht 70.0 in | Wt 203.0 lb

## 2022-02-13 DIAGNOSIS — I4892 Unspecified atrial flutter: Secondary | ICD-10-CM | POA: Diagnosis not present

## 2022-02-13 DIAGNOSIS — I341 Nonrheumatic mitral (valve) prolapse: Secondary | ICD-10-CM | POA: Diagnosis not present

## 2022-02-13 DIAGNOSIS — E782 Mixed hyperlipidemia: Secondary | ICD-10-CM

## 2022-02-13 NOTE — Patient Instructions (Signed)
Medication Instructions:  Your physician recommends that you continue on your current medications as directed. Please refer to the Current Medication list given to you today.  *If you need a refill on your cardiac medications before your next appointment, please call your pharmacy*  Follow-Up: At [facility], you and your health needs are our priority.  As part of our continuing mission to provide you with exceptional heart care, we have created designated Provider Care Teams.  These Care Teams include your primary Cardiologist (physician) and Advanced Practice Providers (APPs -  Physician Assistants and Nurse Practitioners) who all work together to provide you with the care you need, when you need it.   Your next appointment:   1 year(s)  The format for your next appointment:   In Person  Provider:   Werner Lean, MD    Important Information About Sugar

## 2022-02-20 DIAGNOSIS — H25812 Combined forms of age-related cataract, left eye: Secondary | ICD-10-CM | POA: Diagnosis not present

## 2022-02-20 DIAGNOSIS — H2512 Age-related nuclear cataract, left eye: Secondary | ICD-10-CM | POA: Diagnosis not present

## 2022-03-28 ENCOUNTER — Encounter: Payer: Self-pay | Admitting: Internal Medicine

## 2022-03-28 DIAGNOSIS — R739 Hyperglycemia, unspecified: Secondary | ICD-10-CM | POA: Insufficient documentation

## 2022-03-28 DIAGNOSIS — R7303 Prediabetes: Secondary | ICD-10-CM | POA: Insufficient documentation

## 2022-03-28 NOTE — Patient Instructions (Addendum)
Blood work was ordered.   The lab is on the first floor.    Medications changes include :   none   A ct scan or your heart was ordered -- coronary artery calcium score.    Return in about 1 year (around 03/30/2023) for Physical Exam.     Health Maintenance, Male Adopting a healthy lifestyle and getting preventive care are important in promoting health and wellness. Ask your health care provider about: The right schedule for you to have regular tests and exams. Things you can do on your own to prevent diseases and keep yourself healthy. What should I know about diet, weight, and exercise? Eat a healthy diet  Eat a diet that includes plenty of vegetables, fruits, low-fat dairy products, and lean protein. Do not eat a lot of foods that are high in solid fats, added sugars, or sodium. Maintain a healthy weight Body mass index (BMI) is a measurement that can be used to identify possible weight problems. It estimates body fat based on height and weight. Your health care provider can help determine your BMI and help you achieve or maintain a healthy weight. Get regular exercise Get regular exercise. This is one of the most important things you can do for your health. Most adults should: Exercise for at least 150 minutes each week. The exercise should increase your heart rate and make you sweat (moderate-intensity exercise). Do strengthening exercises at least twice a week. This is in addition to the moderate-intensity exercise. Spend less time sitting. Even light physical activity can be beneficial. Watch cholesterol and blood lipids Have your blood tested for lipids and cholesterol at 70 years of age, then have this test every 5 years. You may need to have your cholesterol levels checked more often if: Your lipid or cholesterol levels are high. You are older than 70 years of age. You are at high risk for heart disease. What should I know about cancer screening? Many types of  cancers can be detected early and may often be prevented. Depending on your health history and family history, you may need to have cancer screening at various ages. This may include screening for: Colorectal cancer. Prostate cancer. Skin cancer. Lung cancer. What should I know about heart disease, diabetes, and high blood pressure? Blood pressure and heart disease High blood pressure causes heart disease and increases the risk of stroke. This is more likely to develop in people who have high blood pressure readings or are overweight. Talk with your health care provider about your target blood pressure readings. Have your blood pressure checked: Every 3-5 years if you are 48-63 years of age. Every year if you are 51 years old or older. If you are between the ages of 38 and 41 and are a current or former smoker, ask your health care provider if you should have a one-time screening for abdominal aortic aneurysm (AAA). Diabetes Have regular diabetes screenings. This checks your fasting blood sugar level. Have the screening done: Once every three years after age 16 if you are at a normal weight and have a low risk for diabetes. More often and at a younger age if you are overweight or have a high risk for diabetes. What should I know about preventing infection? Hepatitis B If you have a higher risk for hepatitis B, you should be screened for this virus. Talk with your health care provider to find out if you are at risk for hepatitis B infection. Hepatitis  C Blood testing is recommended for: Everyone born from [birth year] through 1963. Anyone with known risk factors for hepatitis C. Sexually transmitted infections (STIs) You should be screened each year for STIs, including gonorrhea and chlamydia, if: You are sexually active and are younger than 70 years of age. You are older than 70 years of age and your health care provider tells you that you are at risk for this type of infection. Your sexual  activity has changed since you were last screened, and you are at increased risk for chlamydia or gonorrhea. Ask your health care provider if you are at risk. Ask your health care provider about whether you are at high risk for HIV. Your health care provider may recommend a prescription medicine to help prevent HIV infection. If you choose to take medicine to prevent HIV, you should first get tested for HIV. You should then be tested every 3 months for as long as you are taking the medicine. Follow these instructions at home: Alcohol use Do not drink alcohol if your health care provider tells you not to drink. If you drink alcohol: Limit how much you have to 0-2 drinks a day. Know how much alcohol is in your drink. In the [place] one drink equals one 12 oz bottle of beer (355 mL), one 5 oz glass of wine (148 mL), or one 1 oz glass of hard liquor (44 mL). Lifestyle Do not use any products that contain nicotine or tobacco. These products include cigarettes, chewing tobacco, and vaping devices, such as e-cigarettes. If you need help quitting, ask your health care provider. Do not use street drugs. Do not share needles. Ask your health care provider for help if you need support or information about quitting drugs. General instructions Schedule regular health, dental, and eye exams. Stay current with your vaccines. Tell your health care provider if: You often feel depressed. You have ever been abused or do not feel safe at home. Summary Adopting a healthy lifestyle and getting preventive care are important in promoting health and wellness. Follow your health care provider's instructions about healthy diet, exercising, and getting tested or screened for diseases. Follow your health care provider's instructions on monitoring your cholesterol and blood pressure. This information is not intended to replace advice given to you by your health care provider. Make sure you discuss any questions you have  with your health care provider. Document Revised: 07/10/2020 Document Reviewed: 07/10/2020 Elsevier Patient Education  [place].

## 2022-03-28 NOTE — Progress Notes (Signed)
Subjective:    Patient ID: Donald Wilkins, male    DOB: Jan 19, 1951, 71 y.o.   MRN: [id number]     HPI Donald Wilkins is here for a physical exam.   No concerns   Medications and allergies reviewed with patient and updated if appropriate.  Current Outpatient Medications on File Prior to Visit  Medication Sig Dispense Refill   aspirin EC 81 MG tablet Take 1 tablet (81 mg total) by mouth daily. Swallow whole. 90 tablet 3   diclofenac sodium (VOLTAREN) 1 % GEL Apply 4 g topically 4 (four) times daily. 100 g 5   finasteride (PROSCAR) 5 MG tablet TAKE 1/2 TABLET BY MOUTH EVERY 2 DAYS 90 tablet 3   FLUoxetine (PROZAC) 10 MG capsule TAKE 1 CAPSULE BY MOUTH EVERY DAY Annual appt due in Jan must see provider for future refills 90 capsule 0   metoprolol tartrate (LOPRESSOR) 25 MG tablet Take 0.5 tablets (12.5 mg total) by mouth 2 (two) times daily. Annual appt due in Jan must see provider for future refills 90 tablet 0   Multiple Vitamin (MULTIVITAMIN WITH MINERALS) TABS tablet Take 1 tablet by mouth daily. Centrum Silver     Omega-3 Fatty Acids (SALMON OIL-1000 PO) Take 1,000 mg by mouth daily.     sildenafil (REVATIO) 20 MG tablet Take 2-5 pills as needed 40 tablet 5   Turmeric (QC TUMERIC COMPLEX PO) Take by mouth.     No current facility-administered medications on file prior to visit.    Review of Systems  Constitutional:  Negative for fever.  Eyes:  Negative for visual disturbance.  Respiratory:  Negative for cough, shortness of breath and wheezing.   Cardiovascular:  Negative for chest pain, palpitations and leg swelling.  Gastrointestinal:  Negative for abdominal pain, blood in stool, constipation, diarrhea and nausea.       Donald Wilkins a couple of times a week  Genitourinary:  Negative for difficulty urinating, dysuria and hematuria.  Musculoskeletal:  Negative for arthralgias and back pain.  Skin:  Negative for rash.  Neurological:  Negative for dizziness, light-headedness, numbness and  headaches.  Psychiatric/Behavioral:  Negative for dysphoric mood. The patient is not nervous/anxious.        Objective:   Vitals:   03/29/22 0950  BP: 118/74  Pulse: 68  Temp: 98.4 F (36.9 C)  SpO2: 96%   Filed Weights   03/29/22 0950  Weight: 200 lb (90.7 kg)   Body mass index is 28.7 kg/m.  BP Readings from Last 3 Encounters:  03/29/22 118/74  02/13/22 128/68  08/02/21 118/62    Wt Readings from Last 3 Encounters:  03/29/22 200 lb (90.7 kg)  02/13/22 203 lb (92.1 kg)  08/02/21 190 lb (86.2 kg)      Physical Exam Constitutional: He appears well-developed and well-nourished. No distress.  HENT:  Head: Normocephalic and atraumatic.  Right Ear: External ear normal.  Left Ear: External ear normal.  Mouth/Throat: Oropharynx is clear and moist.  Normal ear canals and TM b/l  Eyes: Conjunctivae and EOM are normal.  Neck: Neck supple. No tracheal deviation present. No thyromegaly present.  No carotid bruit  Cardiovascular: Normal rate, regular rhythm, normal heart sounds and intact distal pulses.   No murmur heard. Pulmonary/Chest: Effort normal and breath sounds normal. No respiratory distress. He has no wheezes. He has no rales.  Abdominal: Soft. He exhibits no distension. There is no tenderness.  Genitourinary: deferred  Musculoskeletal: He exhibits no edema.  Lymphadenopathy:  He has no cervical adenopathy.  Skin: Skin is warm and dry. He is not diaphoretic.  Psychiatric: He has a normal mood and affect. His behavior is normal.    The 10-year ASCVD risk score (Arnett DK, et al., 2019) is: 13.7%   Values used to calculate the score:     Age: 20 years     Sex: Male     Is Non-Hispanic African American: No     Diabetic: No     Tobacco smoker: No     Systolic Blood Pressure: 883 mmHg     Is BP treated: No     HDL Cholesterol: 52.9 mg/dL     Total Cholesterol: 182 mg/dL      Assessment & Plan:   Physical exam: Screening blood work  ordered Exercise    walks - should get back to gym Weight  weight is good for age Substance abuse   none    Reviewed recommended immunizations.   Health Maintenance  Topic Date Due   Medicare Annual Wellness (AWV)  02/08/2023   COLONOSCOPY (Pts 45-34yr Insurance coverage will need to be confirmed)  08/02/2024   DTaP/Tdap/Td (3 - Td or Tdap) 12/01/2028   Pneumonia Vaccine 70 Years old  Completed   INFLUENZA VACCINE  Completed   COVID-19 Vaccine  Completed   Hepatitis C Screening  Completed   Zoster Vaccines- Shingrix  Completed   HPV VACCINES  Aged Out     See Problem List for Assessment and Plan of chronic medical problems.

## 2022-03-29 ENCOUNTER — Ambulatory Visit (INDEPENDENT_AMBULATORY_CARE_PROVIDER_SITE_OTHER): Payer: Medicare HMO | Admitting: Internal Medicine

## 2022-03-29 VITALS — BP 118/74 | HR 68 | Temp 98.4°F | Ht 70.0 in | Wt 200.0 lb

## 2022-03-29 DIAGNOSIS — L649 Androgenic alopecia, unspecified: Secondary | ICD-10-CM | POA: Diagnosis not present

## 2022-03-29 DIAGNOSIS — E782 Mixed hyperlipidemia: Secondary | ICD-10-CM | POA: Diagnosis not present

## 2022-03-29 DIAGNOSIS — N529 Male erectile dysfunction, unspecified: Secondary | ICD-10-CM | POA: Diagnosis not present

## 2022-03-29 DIAGNOSIS — R69 Illness, unspecified: Secondary | ICD-10-CM | POA: Diagnosis not present

## 2022-03-29 DIAGNOSIS — R739 Hyperglycemia, unspecified: Secondary | ICD-10-CM

## 2022-03-29 DIAGNOSIS — I4892 Unspecified atrial flutter: Secondary | ICD-10-CM

## 2022-03-29 DIAGNOSIS — Z125 Encounter for screening for malignant neoplasm of prostate: Secondary | ICD-10-CM

## 2022-03-29 DIAGNOSIS — Z0000 Encounter for general adult medical examination without abnormal findings: Secondary | ICD-10-CM

## 2022-03-29 DIAGNOSIS — Z136 Encounter for screening for cardiovascular disorders: Secondary | ICD-10-CM | POA: Diagnosis not present

## 2022-03-29 DIAGNOSIS — F32A Depression, unspecified: Secondary | ICD-10-CM

## 2022-03-29 LAB — COMPREHENSIVE METABOLIC PANEL
ALT: 26 U/L (ref 0–53)
AST: 26 U/L (ref 0–37)
Albumin: 4.2 g/dL (ref 3.5–5.2)
Alkaline Phosphatase: 55 U/L (ref 39–117)
BUN: 12 mg/dL (ref 6–23)
CO2: 27 mEq/L (ref 19–32)
Calcium: 9 mg/dL (ref 8.4–10.5)
Chloride: 102 mEq/L (ref 96–112)
Creatinine, Ser: 0.77 mg/dL (ref 0.40–1.50)
GFR: 91.08 mL/min (ref >60.00)
Glucose, Bld: 92 mg/dL (ref 70–99)
Potassium: 4.4 mEq/L (ref 3.5–5.1)
Sodium: 137 mEq/L (ref 135–145)
Total Bilirubin: 0.6 mg/dL (ref 0.2–1.2)
Total Protein: 6.5 g/dL (ref 6.0–8.3)

## 2022-03-29 LAB — CBC WITH DIFFERENTIAL/PLATELET
Basophils Absolute: 0 10*3/uL (ref 0.0–0.1)
Basophils Relative: 0.4 % (ref 0.0–3.0)
Eosinophils Absolute: 0.1 10*3/uL (ref 0.0–0.7)
Eosinophils Relative: 1.7 % (ref 0.0–5.0)
HCT: 41.1 % (ref 39.0–52.0)
Hemoglobin: 14 g/dL (ref 13.0–17.0)
Lymphocytes Relative: 22.3 % (ref 12.0–46.0)
Lymphs Abs: 1.1 10*3/uL (ref 0.7–4.0)
MCHC: 34 g/dL (ref 30.0–36.0)
MCV: 93 fl (ref 78.0–100.0)
Monocytes Absolute: 0.7 10*3/uL (ref 0.1–1.0)
Monocytes Relative: 13.7 % — ABNORMAL HIGH (ref 3.0–12.0)
Neutro Abs: 3.1 10*3/uL (ref 1.4–7.7)
Neutrophils Relative %: 61.9 % (ref 43.0–77.0)
Platelets: 203 10*3/uL (ref 150.0–400.0)
RBC: 4.42 Mil/uL (ref 4.22–5.81)
RDW: 13.1 % (ref 11.5–15.5)
WBC: 5 10*3/uL (ref 4.0–10.5)

## 2022-03-29 LAB — TSH: TSH: 1.3 u[IU]/mL (ref 0.35–5.50)

## 2022-03-29 LAB — LIPID PANEL
Cholesterol: 154 mg/dL (ref 0–200)
HDL: 41.2 mg/dL (ref >39.00)
LDL Cholesterol: 94 mg/dL (ref 0–99)
NonHDL: 112.48
Total CHOL/HDL Ratio: 4
Triglycerides: 92 mg/dL (ref 0.0–149.0)
VLDL: 18.4 mg/dL (ref 0.0–40.0)

## 2022-03-29 LAB — PSA, MEDICARE: PSA: 0.39 ng/ml (ref 0.10–4.00)

## 2022-03-29 LAB — HEMOGLOBIN A1C: Hgb A1c MFr Bld: 5.9 % (ref 4.6–6.5)

## 2022-03-29 NOTE — Assessment & Plan Note (Signed)
Chronic Continue Proscar 5 mg daily

## 2022-03-29 NOTE — Assessment & Plan Note (Signed)
Chronic Controlled, Stable Continue fluoxetine 10 mg daily 

## 2022-03-29 NOTE — Assessment & Plan Note (Signed)
Chronic Follows with cardiology S/p cardioversion Continue aspirin 81 mg daily, metoprolol 12.5 mg twice daily

## 2022-03-29 NOTE — Assessment & Plan Note (Signed)
Chronic Continue sildenafil 40-100 mg daily as needed

## 2022-03-29 NOTE — Assessment & Plan Note (Addendum)
Chronic Regular exercise and healthy diet encouraged Check lipid panel  Continue lifestyle control Discussed ASCVD Will get a Ct coronary artery calcium score

## 2022-03-29 NOTE — Assessment & Plan Note (Signed)
Chronic Check a1c Low sugar / carb diet Stressed regular exercise   

## 2022-03-30 ENCOUNTER — Encounter: Payer: Self-pay | Admitting: Internal Medicine

## 2022-04-17 ENCOUNTER — Ambulatory Visit (HOSPITAL_COMMUNITY)
Admission: RE | Admit: 2022-04-17 | Discharge: 2022-04-17 | Disposition: A | Discharge status: Home / Self Care | Payer: Medicare HMO | Source: Ambulatory Visit | Attending: Internal Medicine | Admitting: Internal Medicine

## 2022-04-17 DIAGNOSIS — Z136 Encounter for screening for cardiovascular disorders: Secondary | ICD-10-CM | POA: Insufficient documentation

## 2022-04-21 ENCOUNTER — Encounter: Payer: Self-pay | Admitting: Internal Medicine

## 2022-04-21 DIAGNOSIS — I2510 Atherosclerotic heart disease of native coronary artery without angina pectoris: Secondary | ICD-10-CM | POA: Insufficient documentation

## 2022-04-24 ENCOUNTER — Other Ambulatory Visit: Payer: Self-pay | Admitting: Internal Medicine

## 2022-05-07 ENCOUNTER — Encounter: Payer: Self-pay | Admitting: Internal Medicine

## 2022-05-07 NOTE — Patient Instructions (Addendum)
      Medications changes include :   prednisone 40 mg daily with food x 5 days.  Crestor 5 mg daily    Have blood work done in about 6 weeks.     Return if symptoms worsen or fail to improve.

## 2022-05-07 NOTE — Progress Notes (Unsigned)
    Subjective:    Patient ID: Annabell Howells, male    DOB: 01/19/1951, 72 y.o.   MRN: [id number]      HPI Zander is here for No chief complaint on file.  He is here for an acute visit for cold symptoms.  His symptoms started  He is experiencing   He has tried taking       Medications and allergies reviewed with patient and updated if appropriate.  Current Outpatient Medications on File Prior to Visit  Medication Sig Dispense Refill   aspirin EC 81 MG tablet Take 1 tablet (81 mg total) by mouth daily. Swallow whole. 90 tablet 3   diclofenac sodium (VOLTAREN) 1 % GEL Apply 4 g topically 4 (four) times daily. 100 g 5   finasteride (PROSCAR) 5 MG tablet TAKE 1/2 TABLET BY MOUTH EVERY 2 DAYS 90 tablet 3   FLUoxetine (PROZAC) 10 MG capsule TAKE 1 CAPSULE BY MOUTH EVERY DAY Annual appt due in Jan must see provider for future refills 90 capsule 0   metoprolol tartrate (LOPRESSOR) 25 MG tablet Take 0.5 tablets (12.5 mg total) by mouth 2 (two) times daily. Annual appt due in Jan must see provider for future refills 90 tablet 0   Multiple Vitamin (MULTIVITAMIN WITH MINERALS) TABS tablet Take 1 tablet by mouth daily. Centrum Silver     Omega-3 Fatty Acids (SALMON OIL-1000 PO) Take 1,000 mg by mouth daily.     sildenafil (REVATIO) 20 MG tablet Take 2-5 pills as needed 40 tablet 5   Turmeric (QC TUMERIC COMPLEX PO) Take by mouth.     No current facility-administered medications on file prior to visit.    Review of Systems     Objective:  There were no vitals filed for this visit. BP Readings from Last 3 Encounters:  03/29/22 118/74  02/13/22 128/68  08/02/21 118/62   Wt Readings from Last 3 Encounters:  03/29/22 200 lb (90.7 kg)  02/13/22 203 lb (92.1 kg)  08/02/21 190 lb (86.2 kg)   There is no height or weight on file to calculate BMI.    Physical Exam         Assessment & Plan:    See Problem List for Assessment and Plan of chronic medical problems.

## 2022-05-08 ENCOUNTER — Ambulatory Visit (INDEPENDENT_AMBULATORY_CARE_PROVIDER_SITE_OTHER): Payer: Medicare HMO | Admitting: Internal Medicine

## 2022-05-08 VITALS — BP 124/70 | HR 70 | Temp 98.6°F | Ht 70.0 in | Wt 199.2 lb

## 2022-05-08 DIAGNOSIS — I2510 Atherosclerotic heart disease of native coronary artery without angina pectoris: Secondary | ICD-10-CM | POA: Diagnosis not present

## 2022-05-08 DIAGNOSIS — R7303 Prediabetes: Secondary | ICD-10-CM | POA: Diagnosis not present

## 2022-05-08 DIAGNOSIS — J019 Acute sinusitis, unspecified: Secondary | ICD-10-CM | POA: Diagnosis not present

## 2022-05-08 DIAGNOSIS — H6993 Unspecified Eustachian tube disorder, bilateral: Secondary | ICD-10-CM | POA: Diagnosis not present

## 2022-05-08 MED ORDER — FLUOXETINE HCL 10 MG PO CAPS: ORAL_CAPSULE | ORAL | 2 refills | Status: DC

## 2022-05-08 MED ORDER — PREDNISONE 20 MG PO TABS: 40.0000 mg | ORAL_TABLET | Freq: Every day | ORAL | 0 refills | Status: AC

## 2022-05-08 MED ORDER — METOPROLOL TARTRATE 25 MG PO TABS: 12.5000 mg | ORAL_TABLET | Freq: Two times a day (BID) | ORAL | 3 refills | Status: DC

## 2022-05-08 NOTE — Assessment & Plan Note (Signed)
New with his most recent blood work Reviewed A1c Discussed exercise, decrease sugars/carbs and losing a few pounds Will recheck A1c in 6 weeks

## 2022-05-08 NOTE — Assessment & Plan Note (Signed)
Chronic Mild CAD Reviewed CT scan briefly and discussed statin and the purpose of the statin He agrees to take Crestor 5 mg daily Discussed possible side effects Check lipid panel, hepatic function in approximately 6 weeks

## 2022-05-08 NOTE — Assessment & Plan Note (Addendum)
Acute Likely viral in nature Symptomatic treatment with otc-no need for antibiotic Will give him some prednisone for his eustachian tube dysfunction Call if no improvement

## 2022-05-08 NOTE — Assessment & Plan Note (Signed)
Acute Related to sinus infection Not improving Prednisone 40 mg daily x 5 days Call if no improvement

## 2022-05-13 ENCOUNTER — Encounter: Payer: Self-pay | Admitting: Internal Medicine

## 2022-05-14 MED ORDER — ROSUVASTATIN CALCIUM 5 MG PO TABS: 5.0000 mg | ORAL_TABLET | Freq: Every day | ORAL | 3 refills | Status: DC

## 2022-05-20 NOTE — Progress Notes (Unsigned)
Virtual Visit via Video Note  I connected with Donald Wilkins on 05/20/22 at  3:40 PM EDT by a video enabled telemedicine application and verified that I am speaking with the correct person using two identifiers.   I discussed the limitations of evaluation and management by telemedicine and the availability of in person appointments. The patient expressed understanding and agreed to proceed.  Present for the visit:  Myself, Dr Billey Gosling, Vickki Hearing.  The patient is currently at home and I am in the office.    No referring provider.    History of Present Illness: This is an acute visit for sinus infection  I saw him 3/6 for sinus symptoms and ETD.  Prednisone helped his ETD.  Symptoms thought to be viral so no abx was given.   Symptoms are proved.  Over the past several days he has started to have sinus pain especially around the right eye, but sinuses in general.  He is getting daily headaches.  He denies a significant amount of congestion, fevers, sore throat.  This is very unusual for him which is why he was concerned about a possible sinus infection.   Review of Systems  Constitutional:  Negative for fever.  HENT:  Positive for sinus pain (esp around right eye). Negative for congestion, ear pain and sore throat.        No runny nose  Respiratory:  Negative for cough, shortness of breath and wheezing.   Neurological:  Positive for headaches.     Social History   Socioeconomic History   Marital status: Married    Spouse name: Benjamine Mola   Number of children: Not on file   Years of education: Not on file   Highest education level: Not on file  Occupational History   Not on file  Tobacco Use   Smoking status: Former    Types: Cigarettes    Quit date: 03/05/1971    Years since quitting: 51.2   Smokeless tobacco: Never   Tobacco comments:    smoked  1970-1973, up to 1/2 ppd  Vaping Use   Vaping Use: Never used  Substance and Sexual Activity   Alcohol use: Yes    Comment:  socially; 2-3 / week   Drug use: No   Sexual activity: Not on file  Other Topics Concern   Not on file  Social History Narrative   Exercise: walks, chops wood   Social Determinants of Health   Financial Resource Strain: Low Risk  (02/07/2022)   Overall Financial Resource Strain (CARDIA)    Difficulty of Paying Living Expenses: Not hard at all  Food Insecurity: No Food Insecurity (02/07/2022)   Hunger Vital Sign    Worried About Running Out of Food in the Last Year: Never true    [place] in the Last Year: Never true  Transportation Needs: No Transportation Needs (02/07/2022)   PRAPARE - Hydrologist (Medical): No    Lack of Transportation (Non-Medical): No  Physical Activity: Sufficiently Active (02/07/2022)   Exercise Vital Sign    Days of Exercise per Week: 5 days    Minutes of Exercise per Session: 30 min  Stress: No Stress Concern Present (02/07/2022)   [place]    Feeling of Stress : Not at all  Social Connections: [place] (02/07/2022)   Social Connection and Isolation Panel [NHANES]    Frequency of Communication with Friends and Family: More  than three times a week    Frequency of Social Gatherings with Friends and Family: More than three times a week    Attends Religious Services: More than 4 times per year    Active Member of Clubs or Organizations: Yes    Attends Music therapist: More than 4 times per year    Marital Status: Married     Observations/Objective: Appears well in NAD Breathing normally  Assessment and Plan:  See Problem List for Assessment and Plan of chronic medical problems.   Follow Up Instructions:    I discussed the assessment and treatment plan with the patient. The patient was provided an opportunity to ask questions and all were answered. The patient agreed with the plan and demonstrated an understanding of the  instructions.   The patient was advised to call back or seek an in-person evaluation if the symptoms worsen or if the condition fails to improve as anticipated.    Binnie Rail, MD

## 2022-05-21 ENCOUNTER — Telehealth (INDEPENDENT_AMBULATORY_CARE_PROVIDER_SITE_OTHER): Payer: Medicare HMO | Admitting: Internal Medicine

## 2022-05-21 ENCOUNTER — Encounter: Payer: Self-pay | Admitting: Internal Medicine

## 2022-05-21 DIAGNOSIS — J019 Acute sinusitis, unspecified: Secondary | ICD-10-CM

## 2022-05-21 MED ORDER — AMOXICILLIN-POT CLAVULANATE 875-125 MG PO TABS: 1.0000 | ORAL_TABLET | Freq: Two times a day (BID) | ORAL | 0 refills | Status: AC

## 2022-05-21 NOTE — Assessment & Plan Note (Addendum)
Acute Likely sinus headache, but there is also a small Concern for bacterial cause Start Augmentin 875-125 mg BID x 7 days, which he will only take if his symptoms do not improve over the next couple of days otc cold medications, ibuprofen  Call if no improvement

## 2022-06-18 DIAGNOSIS — Z961 Presence of intraocular lens: Secondary | ICD-10-CM | POA: Diagnosis not present

## 2022-08-18 NOTE — Progress Notes (Unsigned)
Virtual Visit via Video Note  I connected with Donald Wilkins on 08/18/22 at 10:00 AM EDT by a video enabled telemedicine application and verified that I am speaking with the correct person using two identifiers.   I discussed the limitations of evaluation and management by telemedicine and the availability of in person appointments. The patient expressed understanding and agreed to proceed.  Present for the visit:  Myself, Dr Cheryll Cockayne, Katherina Right.  The patient is currently at home and I am in the office.    No referring provider.    History of Present Illness: This is an acute visit for pain posterior knee, proximal calf.   Left leg pain and posterior knee-proximal calf for about 5 weeks.  It started around the time he was at his [facility] cabin 5 weeks ago and he was doing a lot of yard work for several hours.  He thought he strained a muscle.  The pain got better, but never fully went away.  At night especially he has a throbbing and pressure sensation in the proximal left calf area.  He denies any redness, change in skin color, swelling in the calf or knee.  He denies any anterior knee pain.  Besides overdoing it that day there is no injury.    Social History   Socioeconomic History   Marital status: Married    Spouse name: Lanora Manis   Number of children: Not on file   Years of education: Not on file   Highest education level: Not on file  Occupational History   Not on file  Tobacco Use   Smoking status: Former    Types: Cigarettes    Quit date: 03/05/1971    Years since quitting: 51.4   Smokeless tobacco: Never   Tobacco comments:    smoked  1970-1973, up to 1/2 ppd  Vaping Use   Vaping Use: Never used  Substance and Sexual Activity   Alcohol use: Yes    Comment: socially; 2-3 / week   Drug use: No   Sexual activity: Not on file  Other Topics Concern   Not on file  Social History Narrative   Exercise: walks, chops wood   Social Determinants of Health   Financial  Resource Strain: Low Risk  (02/07/2022)   Overall Financial Resource Strain (CARDIA)    Difficulty of Paying Living Expenses: Not hard at all  Food Insecurity: No Food Insecurity (02/07/2022)   Hunger Vital Sign    Worried About Running Out of Food in the Last Year: Never true    Ran Out of Food in the Last Year: Never true  Transportation Needs: No Transportation Needs (02/07/2022)   PRAPARE - Administrator, Civil Service (Medical): No    Lack of Transportation (Non-Medical): No  Physical Activity: Sufficiently Active (02/07/2022)   Exercise Vital Sign    Days of Exercise per Week: 5 days    Minutes of Exercise per Session: 30 min  Stress: No Stress Concern Present (02/07/2022)   [employer] of Occupational Health - Occupational Stress Questionnaire    Feeling of Stress : Not at all  Social Connections: Socially Integrated (02/07/2022)   Social Connection and Isolation Panel [NHANES]    Frequency of Communication with Friends and Family: More than three times a week    Frequency of Social Gatherings with Friends and Family: More than three times a week    Attends Religious Services: More than 4 times per year    Active Member  of Clubs or Organizations: Yes    Attends Engineer, structural: More than 4 times per year    Marital Status: Married     Observations/Objective: Appears well in NAD   Assessment and Plan:  See Problem List for Assessment and Plan of chronic medical problems.   Follow Up Instructions:    I discussed the assessment and treatment plan with the patient. The patient was provided an opportunity to ask questions and all were answered. The patient agreed with the plan and demonstrated an understanding of the instructions.   The patient was advised to call back or seek an in-person evaluation if the symptoms worsen or if the condition fails to improve as anticipated.    Pincus Sanes, MD

## 2022-08-19 ENCOUNTER — Encounter: Payer: Self-pay | Admitting: Internal Medicine

## 2022-08-19 ENCOUNTER — Telehealth (INDEPENDENT_AMBULATORY_CARE_PROVIDER_SITE_OTHER): Payer: Medicare HMO | Admitting: Internal Medicine

## 2022-08-19 DIAGNOSIS — M79662 Pain in left lower leg: Secondary | ICD-10-CM | POA: Diagnosis not present

## 2022-08-19 NOTE — Assessment & Plan Note (Signed)
Subacute Started approximately 5 weeks ago after doing a lot of yard work at his [facility].  Thought he initially strained a muscle, the pain has been persistent-now more of a pressure, throbbing sensation in the proximal calf No swelling, erythema Low risk for DVT, but will order ultrasound to rule it out Possible muscle strain, possible cyst Depending on results of ultrasound would consider sports medicine referral

## 2022-08-23 ENCOUNTER — Ambulatory Visit (HOSPITAL_COMMUNITY)
Admission: RE | Admit: 2022-08-23 | Discharge: 2022-08-23 | Disposition: A | Discharge status: Home / Self Care | Payer: Medicare HMO | Source: Ambulatory Visit | Attending: Vascular Surgery | Admitting: Vascular Surgery

## 2022-08-23 DIAGNOSIS — M79662 Pain in left lower leg: Secondary | ICD-10-CM

## 2022-10-03 ENCOUNTER — Other Ambulatory Visit (INDEPENDENT_AMBULATORY_CARE_PROVIDER_SITE_OTHER): Payer: Medicare HMO

## 2022-10-03 DIAGNOSIS — R7303 Prediabetes: Secondary | ICD-10-CM

## 2022-10-03 DIAGNOSIS — I2510 Atherosclerotic heart disease of native coronary artery without angina pectoris: Secondary | ICD-10-CM

## 2022-10-03 LAB — HEPATIC FUNCTION PANEL
ALT: 18 U/L (ref 0–53)
AST: 18 U/L (ref 0–37)
Albumin: 4.2 g/dL (ref 3.5–5.2)
Alkaline Phosphatase: 59 U/L (ref 39–117)
Bilirubin, Direct: 0.2 mg/dL (ref 0.0–0.3)
Total Bilirubin: 0.9 mg/dL (ref 0.2–1.2)
Total Protein: 6.7 g/dL (ref 6.0–8.3)

## 2022-10-03 LAB — LIPID PANEL
Cholesterol: 139 mg/dL (ref 0–200)
HDL: 53.9 mg/dL (ref >39.00)
LDL Cholesterol: 69 mg/dL (ref 0–99)
NonHDL: 84.75
Total CHOL/HDL Ratio: 3
Triglycerides: 81 mg/dL (ref 0.0–149.0)
VLDL: 16.2 mg/dL (ref 0.0–40.0)

## 2022-10-03 LAB — HEMOGLOBIN A1C: Hgb A1c MFr Bld: 5.8 % (ref 4.6–6.5)

## 2022-11-13 ENCOUNTER — Telehealth: Payer: Self-pay | Admitting: Internal Medicine

## 2022-11-13 NOTE — Telephone Encounter (Signed)
Spoke with the patient who states that his Donald Wilkins has showed possible afib for the past couple of days. He reports he is feeling similar to how he has prior to his cardioversion when he was having aflutter. He is only taking a baby aspirin daily. Heart rates have been stable. He is scheduled to see an APP on Friday 9/13.

## 2022-11-13 NOTE — Telephone Encounter (Signed)
Patient c/o Palpitations:  STAT if patient reporting lightheadedness, shortness of breath, or chest pain  How long have you had palpitations/irregular HR/ Afib? Patient states he has been in a-fib for the past couple of days. Are you having the symptoms now? yes  Are you currently experiencing lightheadedness, SOB or CP? no  Do you have a history of afib (atrial fibrillation) or irregular heart rhythm? States, he has had a-flutter for three years.   Have you checked your BP or HR? (document readings if available): HR 80-85  Are you experiencing any other symptoms? When walking he tends to get winded.  Some chest tightness happens randomly occasionally.   He is wondering if he should start back on Eliquis. He is currently out of town, will be back on Friday.

## 2022-11-14 NOTE — Progress Notes (Deleted)
Cardiology Clinic Note   Patient Name: Donald Wilkins Date of Encounter: 11/14/2022  Primary Care Provider:  Pincus Sanes, MD Primary Cardiologist:  Christell Constant, MD  Patient Profile    72 year old male with a hx of Atrial Flutter, HLD, MVP who presents for evaluation 03/08/20.  In interim of this visit, patient had successful DCCV 04/24/20.  In interim of this visit, patient had surgery for his R arm in June. Seen 01/09/21. No further ALF.  Given low CHADSVASC only on ASA; his sx in AFL was DOE.  Past Medical History    Past Medical History:  Diagnosis Date   A-fib (HCC)    Basal cell cancer    X 2;[facility]   Hyperlipidemia    Incomplete right bundle branch block    MVP (mitral valve prolapse)    on ECHO   Past Surgical History:  Procedure Laterality Date   ANAL FISSURE REPAIR     BASAL CELL CARCINOMA EXCISION  2011 & 2012   Mohs L face; back   CARDIOVERSION N/A 04/24/2020   Procedure: CARDIOVERSION;  Surgeon: Christell Constant, MD;  Location: [facility];  Service: Cardiovascular;  Laterality: N/A;   COLONOSCOPY     colonoscopy with polypectomy  03/05/2003   [facility], Dr Arlyce Dice; negative 2010    Allergies  No Known Allergies  History of Present Illness    ***  Home Medications    Current Outpatient Medications  Medication Sig Dispense Refill   aspirin EC 81 MG tablet Take 1 tablet (81 mg total) by mouth daily. Swallow whole. 90 tablet 3   diclofenac sodium (VOLTAREN) 1 % GEL Apply 4 g topically 4 (four) times daily. 100 g 5   finasteride (PROSCAR) 5 MG tablet TAKE 1/2 TABLET BY MOUTH EVERY 2 DAYS 90 tablet 3   FLUoxetine (PROZAC) 10 MG capsule TAKE 1 CAPSULE BY MOUTH EVERY DAY 90 capsule 2   metoprolol tartrate (LOPRESSOR) 25 MG tablet Take 0.5 tablets (12.5 mg total) by mouth 2 (two) times daily. 90 tablet 3   Multiple Vitamin (MULTIVITAMIN WITH MINERALS) TABS tablet Take 1 tablet by mouth daily. Centrum Silver     Omega-3 Fatty Acids (SALMON  OIL-1000 PO) Take 1,000 mg by mouth daily.     rosuvastatin (CRESTOR) 5 MG tablet Take 1 tablet (5 mg total) by mouth daily. 90 tablet 3   sildenafil (REVATIO) 20 MG tablet Take 2-5 pills as needed 40 tablet 5   Turmeric (QC TUMERIC COMPLEX PO) Take by mouth.     No current facility-administered medications for this visit.     Family History    Family History  Problem Relation Age of Onset   Heart attack Mother 42   Diabetes Father    Lung cancer Father        smoker   Diabetes Sister    Hypertension Sister    Cancer Brother        malignant,brain stem   Heart attack Maternal Uncle        pre 55   Stomach cancer Neg Hx    Colon cancer Neg Hx    Esophageal cancer Neg Hx    Pancreatic cancer Neg Hx    Colon polyps Neg Hx    Rectal cancer Neg Hx    He indicated that his mother is deceased. He indicated that his father is deceased. He indicated that his sister is alive. He indicated that his brother is deceased. He indicated that  his maternal uncle is deceased. He indicated that the status of his neg hx is unknown.  Social History    Social History   Socioeconomic History   Marital status: Married    Spouse name: Lanora Manis   Number of children: Not on file   Years of education: Not on file   Highest education level: Not on file  Occupational History   Not on file  Tobacco Use   Smoking status: Former    Current packs/day: 0.00    Types: Cigarettes    Quit date: 03/05/1971    Years since quitting: 51.7   Smokeless tobacco: Never   Tobacco comments:    smoked  1970-1973, up to 1/2 ppd  Vaping Use   Vaping status: Never Used  Substance and Sexual Activity   Alcohol use: Yes    Comment: socially; 2-3 / week   Drug use: No   Sexual activity: Not on file  Other Topics Concern   Not on file  Social History Narrative   Exercise: walks, chops wood   Social Determinants of Health   Financial Resource Strain: Low Risk  (02/07/2022)   Overall Financial Resource Strain  (CARDIA)    Difficulty of Paying Living Expenses: Not hard at all  Food Insecurity: No Food Insecurity (02/07/2022)   Hunger Vital Sign    Worried About Running Out of Food in the Last Year: Never true    Ran Out of Food in the Last Year: Never true  Transportation Needs: No Transportation Needs (02/07/2022)   PRAPARE - Administrator, Civil Service (Medical): No    Lack of Transportation (Non-Medical): No  Physical Activity: Sufficiently Active (02/07/2022)   Exercise Vital Sign    Days of Exercise per Week: 5 days    Minutes of Exercise per Session: 30 min  Stress: No Stress Concern Present (02/07/2022)   [employer] of Occupational Health - Occupational Stress Questionnaire    Feeling of Stress : Not at all  Social Connections: Socially Integrated (02/07/2022)   Social Connection and Isolation Panel [NHANES]    Frequency of Communication with Friends and Family: More than three times a week    Frequency of Social Gatherings with Friends and Family: More than three times a week    Attends Religious Services: More than 4 times per year    Active Member of [employer] or Organizations: Yes    Attends Engineer, structural: More than 4 times per year    Marital Status: Married  Catering manager Violence: Not At Risk (02/07/2022)   Humiliation, Afraid, Rape, and Kick questionnaire    Fear of Current or Ex-Partner: No    Emotionally Abused: No    Physically Abused: No    Sexually Abused: No     Review of Systems    General:  No chills, fever, night sweats or weight changes.  Cardiovascular:  No chest pain, dyspnea on exertion, edema, orthopnea, palpitations, paroxysmal nocturnal dyspnea. Dermatological: No rash, lesions/masses Respiratory: No cough, dyspnea Urologic: No hematuria, dysuria Abdominal:   No nausea, vomiting, diarrhea, bright red blood per rectum, melena, or hematemesis Neurologic:  No visual changes, wkns, changes in mental status. All other systems  reviewed and are otherwise negative except as noted above.       Physical Exam    VS:  There were no vitals taken for this visit. , BMI There is no height or weight on file to calculate BMI.     GEN: Well nourished,  well developed, in no acute distress. HEENT: normal. Neck: Supple, no JVD, carotid bruits, or masses. Cardiac: RRR, no murmurs, rubs, or gallops. No clubbing, cyanosis, edema.  Radials/DP/PT 2+ and equal bilaterally.  Respiratory:  Respirations regular and unlabored, clear to auscultation bilaterally. GI: Soft, nontender, nondistended, BS + x 4. MS: no deformity or atrophy. Skin: warm and dry, no rash. Neuro:  Strength and sensation are intact. Psych: Normal affect.      Lab Results  Component Value Date   WBC 5.0 03/29/2022   HGB 14.0 03/29/2022   HCT 41.1 03/29/2022   MCV 93.0 03/29/2022   PLT 203.0 03/29/2022   Lab Results  Component Value Date   CREATININE 0.77 03/29/2022   BUN 12 03/29/2022   NA 137 03/29/2022   K 4.4 03/29/2022   CL 102 03/29/2022   CO2 27 03/29/2022   Lab Results  Component Value Date   ALT 18 10/03/2022   AST 18 10/03/2022   ALKPHOS 59 10/03/2022   BILITOT 0.9 10/03/2022   Lab Results  Component Value Date   CHOL 139 10/03/2022   HDL 53.90 10/03/2022   LDLCALC 69 10/03/2022   TRIG 81.0 10/03/2022   CHOLHDL 3 10/03/2022    Lab Results  Component Value Date   HGBA1C 5.8 10/03/2022     Review of Results Cardiac Calcium Score 04/20/2022 FINDINGS: Non-cardiac: No significant non cardiac findings on limited lung and soft tissue windows. See separate report from [facility].   Ascending Aorta: Normal diameter 3.3 cm   Pericardium: Normal   Coronary arteries: Calcium noted primarily in the LAD   LM 0   LAD 56.2   LCX 0.557   RCA 0   Total 56.7   IMPRESSION: Coronary calcium score of 56.7. This was 43 th percentile for age and sex matched control.   Charlton Haws   Electronically Signed: By:  Charlton Haws M.D. On: 04/17/2022 12:16      Echocardiogram 03/27/2020 1. Left ventricular ejection fraction, by estimation, is 60 to 65%. The  left ventricle has normal function. The left ventricle has no regional  wall motion abnormalities. Left ventricular diastolic function could not  be evaluated.   2. Right ventricular systolic function is normal. The right ventricular  size is normal. There is normal pulmonary artery systolic pressure. The  estimated right ventricular systolic pressure is 20.3 mmHg.   3. Left atrial size was moderately dilated.   4. The mitral valve is normal in structure. Mild mitral valve  regurgitation. No evidence of mitral stenosis.   5. Tricuspid valve regurgitation is mild to moderate.   6. The aortic valve is normal in structure. Aortic valve regurgitation is  trivial. No aortic stenosis is present.   7. The inferior vena cava is normal in size with greater than 50%  respiratory variability, suggesting right atrial pressure of 3 mmHg.    Assessment & Plan   1.  ***     {Are you ordering a CV Procedure (e.g. stress test, cath, DCCV, TEE, etc)?   Press F2        :[id number]}   Signed, Bettey Mare. Liborio Nixon, ANP, AACC   11/14/2022 9:26 AM      Office [phone] Fax [phone]  Notice: This dictation was prepared with Dragon dictation along with smaller phrase technology. Any transcriptional errors that result from this process are unintentional and may not be corrected upon review.

## 2022-11-15 ENCOUNTER — Ambulatory Visit: Payer: Medicare HMO | Admitting: Adult Health

## 2022-11-18 ENCOUNTER — Encounter: Payer: Self-pay | Admitting: Nurse Practitioner

## 2022-11-18 ENCOUNTER — Ambulatory Visit: Payer: Medicare HMO | Attending: Adult Health | Admitting: Nurse Practitioner

## 2022-11-18 VITALS — BP 104/74 | HR 74 | Ht 70.0 in | Wt 194.0 lb

## 2022-11-18 DIAGNOSIS — I4892 Unspecified atrial flutter: Secondary | ICD-10-CM

## 2022-11-18 DIAGNOSIS — E785 Hyperlipidemia, unspecified: Secondary | ICD-10-CM | POA: Diagnosis not present

## 2022-11-18 DIAGNOSIS — I340 Nonrheumatic mitral (valve) insufficiency: Secondary | ICD-10-CM

## 2022-11-18 DIAGNOSIS — R931 Abnormal findings on diagnostic imaging of heart and coronary circulation: Secondary | ICD-10-CM | POA: Diagnosis not present

## 2022-11-18 MED ORDER — APIXABAN 5 MG PO TABS: 5.0000 mg | ORAL_TABLET | Freq: Two times a day (BID) | ORAL | 5 refills | Status: DC

## 2022-11-18 NOTE — Progress Notes (Signed)
Office Visit    Patient Name: NHAN MARANDOLA Date of Encounter: 11/18/2022  Primary Care Provider:  Pincus Sanes, MD Primary Cardiologist:  Christell Constant, MD  Chief Complaint    72 year old male with history of paroxysmal atrial flutter, elevated coronary artery calcium score, mitral valve regurgitation, and hyperlipidemia who presents for follow-up related to atrial flutter.  Past Medical History    Past Medical History:  Diagnosis Date   A-fib (HCC)    Basal cell cancer    X 2;[facility]   Hyperlipidemia    Incomplete right bundle branch block    MVP (mitral valve prolapse)    on ECHO   Past Surgical History:  Procedure Laterality Date   ANAL FISSURE REPAIR     BASAL CELL CARCINOMA EXCISION  2011 & 2012   Mohs L face; back   CARDIOVERSION N/A 04/24/2020   Procedure: CARDIOVERSION;  Surgeon: Christell Constant, MD;  Location: [facility];  Service: Cardiovascular;  Laterality: N/A;   COLONOSCOPY     colonoscopy with polypectomy  03/05/2003   [facility], Dr Arlyce Dice; negative 2010    Allergies  No Known Allergies   Labs/Other Studies Reviewed    The following studies were reviewed today:  Cardiac Studies & Procedures       ECHOCARDIOGRAM  ECHOCARDIOGRAM COMPLETE 03/27/2020  Narrative ECHOCARDIOGRAM REPORT    Patient Name:   AMIT ZIRKEL [facility]   Date of Exam: 03/27/2020 Medical Rec #:  [id number]     Height:       70.0 in Accession #:    [id number]    Weight:       200.0 lb Date of Birth:  1950-05-30     BSA:          2.087 m Patient Age:    69 years      BP:           141/88 mmHg Patient Gender: M             HR:           99 bpm. Exam Location:  [address]  Procedure: 2D Echo, Cardiac Doppler and Color Doppler  Indications:    I48.92 Atrial flutter  History:        Patient has no prior history of Echocardiogram examinations. Mitral Valve Prolapse, Signs/Symptoms:Palpitations; Risk Factors:Hypertension.  Sonographer:    Clearence Ped  RCS Referring Phys: [id number] MAHESH A CHANDRASEKHAR  IMPRESSIONS   1. Left ventricular ejection fraction, by estimation, is 60 to 65%. The left ventricle has normal function. The left ventricle has no regional wall motion abnormalities. Left ventricular diastolic function could not be evaluated. 2. Right ventricular systolic function is normal. The right ventricular size is normal. There is normal pulmonary artery systolic pressure. The estimated right ventricular systolic pressure is 20.3 mmHg. 3. Left atrial size was moderately dilated. 4. The mitral valve is normal in structure. Mild mitral valve regurgitation. No evidence of mitral stenosis. 5. Tricuspid valve regurgitation is mild to moderate. 6. The aortic valve is normal in structure. Aortic valve regurgitation is trivial. No aortic stenosis is present. 7. The inferior vena cava is normal in size with greater than 50% respiratory variability, suggesting right atrial pressure of 3 mmHg.  FINDINGS Left Ventricle: Left ventricular ejection fraction, by estimation, is 60 to 65%. The left ventricle has normal function. The left ventricle has no regional wall motion abnormalities. The left ventricular internal cavity size was normal in size. There is  no left ventricular hypertrophy. Left ventricular diastolic function could not be evaluated due to atrial fibrillation. Left ventricular diastolic function could not be evaluated.  Right Ventricle: The right ventricular size is normal. No increase in right ventricular wall thickness. Right ventricular systolic function is normal. There is normal pulmonary artery systolic pressure. The tricuspid regurgitant velocity is 2.08 m/s, and with an assumed right atrial pressure of 3 mmHg, the estimated right ventricular systolic pressure is 20.3 mmHg.  Left Atrium: Left atrial size was moderately dilated.  Right Atrium: Right atrial size was normal in size.  Pericardium: There is no evidence of  pericardial effusion.  Mitral Valve: The mitral valve is normal in structure. There is mild thickening of the mitral valve leaflet(s). There is mild calcification of the mitral valve leaflet(s). Mild mitral valve regurgitation, with centrally-directed jet. No evidence of mitral valve stenosis.  Tricuspid Valve: The tricuspid valve is normal in structure. Tricuspid valve regurgitation is mild to moderate. No evidence of tricuspid stenosis.  Aortic Valve: The aortic valve is normal in structure. Aortic valve regurgitation is trivial. No aortic stenosis is present.  Pulmonic Valve: The pulmonic valve was normal in structure. Pulmonic valve regurgitation is trivial. No evidence of pulmonic stenosis.  Aorta: The aortic root is normal in size and structure.  Venous: The inferior vena cava is normal in size with greater than 50% respiratory variability, suggesting right atrial pressure of 3 mmHg.  IAS/Shunts: No atrial level shunt detected by color flow Doppler.   LEFT VENTRICLE PLAX 2D LVIDd:         4.50 cm  Diastology LVIDs:         3.10 cm  LV e' medial:    11.10 cm/s LV PW:         1.10 cm  LV E/e' medial:  7.7 LV IVS:        0.90 cm  LV e' lateral:   13.20 cm/s LVOT diam:     2.00 cm  LV E/e' lateral: 6.5 LV SV:         41 LV SV Index:   20 LVOT Area:     3.14 cm   RIGHT VENTRICLE RV Basal diam:  3.90 cm RV S prime:     12.70 cm/s TAPSE (M-mode): 2.0 cm RVSP:           20.3 mmHg  LEFT ATRIUM             Index       RIGHT ATRIUM           Index LA diam:        3.40 cm 1.63 cm/m  RA Pressure: 3.00 mmHg LA Vol (A2C):   35.1 ml 16.82 ml/m RA Area:     17.20 cm LA Vol (A4C):   55.2 ml 26.45 ml/m RA Volume:   47.40 ml  22.71 ml/m LA Biplane Vol: 46.3 ml 22.18 ml/m AORTIC VALVE LVOT Vmax:   68.50 cm/s LVOT Vmean:  48.867 cm/s LVOT VTI:    0.131 m  AORTA Ao Root diam: 3.30 cm Ao Asc diam:  2.80 cm  MITRAL VALVE               TRICUSPID VALVE TR Peak grad:   17.3  mmHg MV Decel Time:             TR Vmax:        208.00 cm/s MR Peak grad: 106.5 mmHg   Estimated RAP:  3.00 mmHg MR  Mean grad: 70.0 mmHg    RVSP:           20.3 mmHg MR Vmax:      516.00 cm/s MR Vmean:     393.0 cm/s   SHUNTS MV E velocity: 85.45 cm/s  Systemic VTI:  0.13 m Systemic Diam: 2.00 cm  Tobias Alexander MD Electronically signed by Tobias Alexander MD Signature Date/Time: 03/27/2020/7:39:31 PM    Final     CT SCANS  CT CARDIAC SCORING (SELF PAY ONLY) 04/17/2022  Addendum 04/20/2022  2:45 PM ADDENDUM REPORT: 04/20/2022 14:43  EXAM: OVER-READ INTERPRETATION  CT CHEST  The following report is an over-read performed by radiologist Dr. Elnoria Howard [facility] on 04/20/2022. This over-read does not include interpretation of cardiac or coronary anatomy or pathology. The coronary calcium score interpretation by the cardiologist is attached.  COMPARISON:  None.  FINDINGS: Small amount of linear scarring in the left lower lung zone. No lung nodules visualized. Unremarkable bones. Separately described cardiovascular findings.  IMPRESSION: No significant non cardiovascular abnormalities.   Electronically Signed By: Beckie Salts M.D. On: 04/20/2022 14:43  Narrative CLINICAL DATA:  Risk stratification  EXAM: Coronary Calcium Score  TECHNIQUE: The patient was scanned on a Siemens Somatom 64 slice scanner. Axial non-contrast 3mm slices were carried out through the heart. The data set was analyzed on a dedicated work station and scored using the Agatson method.  FINDINGS: Non-cardiac: No significant non cardiac findings on limited lung and soft tissue windows. See separate report from [facility].  Ascending Aorta: Normal diameter 3.3 cm  Pericardium: Normal  Coronary arteries: Calcium noted primarily in the LAD  LM 0  LAD 56.2  LCX 0.557  RCA 0  Total 56.7  IMPRESSION: Coronary calcium score of 56.7. This was 63 th  percentile for age and sex matched control.  Charlton Haws  Electronically Signed: By: Charlton Haws M.D. On: 04/17/2022 12:16         Recent Labs: 03/29/2022: BUN 12; Creatinine, Ser 0.77; Hemoglobin 14.0; Platelets 203.0; Potassium 4.4; Sodium 137; TSH 1.30 10/03/2022: ALT 18  Recent Lipid Panel    Component Value Date/Time   CHOL 139 10/03/2022 0918   TRIG 81.0 10/03/2022 0918   HDL 53.90 10/03/2022 0918   CHOLHDL 3 10/03/2022 0918   VLDL 16.2 10/03/2022 0918   LDLCALC 69 10/03/2022 0918    History of Present Illness    72 year old male with the above past medical history including paroxysmal atrial flutter, elevated coronary artery calcium score, mitral valve regurgitation, and hyperlipidemia.  He has a history of paroxysmal atrial flutter s/p prior DCCV in 2022.  Prior to his cardioversion he has been dyspnea on exertion.  He in not on anticoagulation given low CHA2DS2-VASc score.  Echocardiogram in 03/2021 showed EF 60 to 65%, normal mild mitral valve regurgitation.  Repeat echocardiogram was recommended in 2025. Coronary calcium score in 04/2022 was 56.7 (37.7 the percentile). He was last seen in the office on 02/13/2022 and was doing well from a cardiac standpoint.  He contacted our office on 11/13/2022 with concern for recurrent atrial fibrillation.  He presents today for follow-up.  Since his last visit he has stable from a cardiac standpoint.  He started feeling some tightness in his chest and mild dyspnea on exertion.  He checked his heart rate/rhythm with his Kardia mobile device which notified him of recurrent atrial fibrillation.  He started taking an old prescription of Eliquis 5 mg twice daily.  He continues to  note mild dyspnea with exertion, otherwise, he denies any new symptoms or concerns.  He is interested in pursuing DCCV.  Home Medications    Current Outpatient Medications  Medication Sig Dispense Refill   apixaban (ELIQUIS) 5 MG TABS tablet Take 1 tablet (5 mg  total) by mouth 2 (two) times daily. 60 tablet 5   aspirin EC 81 MG tablet Take 1 tablet (81 mg total) by mouth daily. Swallow whole. 90 tablet 3   diclofenac sodium (VOLTAREN) 1 % GEL Apply 4 g topically 4 (four) times daily. 100 g 5   finasteride (PROSCAR) 5 MG tablet TAKE 1/2 TABLET BY MOUTH EVERY 2 DAYS 90 tablet 3   FLUoxetine (PROZAC) 10 MG capsule TAKE 1 CAPSULE BY MOUTH EVERY DAY 90 capsule 2   metoprolol tartrate (LOPRESSOR) 25 MG tablet Take 0.5 tablets (12.5 mg total) by mouth 2 (two) times daily. 90 tablet 3   Multiple Vitamin (MULTIVITAMIN WITH MINERALS) TABS tablet Take 1 tablet by mouth daily. Centrum Silver     Omega-3 Fatty Acids (SALMON OIL-1000 PO) Take 1,000 mg by mouth daily.     rosuvastatin (CRESTOR) 5 MG tablet Take 1 tablet (5 mg total) by mouth daily. 90 tablet 3   sildenafil (REVATIO) 20 MG tablet Take 2-5 pills as needed 40 tablet 5   Turmeric (QC TUMERIC COMPLEX PO) Take by mouth.     No current facility-administered medications for this visit.     Review of Systems    He denies chest pain, palpitations, pnd, orthopnea, n, v, dizziness, syncope, edema, weight gain, or early satiety. All other systems reviewed and are otherwise negative except as noted above.   Physical Exam    VS:  BP 104/74   Pulse 74   Ht 5\' 10"  (1.778 m)   Wt 194 lb (88 kg)   SpO2 98%   BMI 27.84 kg/m  GEN: Well nourished, well developed, in no acute distress. HEENT: normal. Neck: Supple, no JVD, carotid bruits, or masses. Cardiac: RRR, no murmurs, rubs, or gallops. No clubbing, cyanosis, edema.  Radials/DP/PT 2+ and equal bilaterally.  Respiratory:  Respirations regular and unlabored, clear to auscultation bilaterally. GI: Soft, nontender, nondistended, BS + x 4. MS: no deformity or atrophy. Skin: warm and dry, no rash. Neuro:  Strength and sensation are intact. Psych: Normal affect.  Accessory Clinical Findings    ECG personally reviewed by me today - EKG  Interpretation Date/Time:  Monday November 18 2022 08:53:32 EDT Ventricular Rate:  74 PR Interval:    QRS Duration:  104 QT Interval:  418 QTC Calculation: 463 R Axis:   -3  Text Interpretation: Atrial flutter with variable A-V block When compared with ECG of 24-Apr-2020 11:45, Atrial flutter has replaced Sinus rhythm Vent. rate has increased BY  24 BPM QT has lengthened Confirmed by [name] ([id number]) on 11/18/2022 9:02:23 AM  - no acute changes.   Lab Results  Component Value Date   WBC 5.0 03/29/2022   HGB 14.0 03/29/2022   HCT 41.1 03/29/2022   MCV 93.0 03/29/2022   PLT 203.0 03/29/2022   Lab Results  Component Value Date   CREATININE 0.77 03/29/2022   BUN 12 03/29/2022   NA 137 03/29/2022   K 4.4 03/29/2022   CL 102 03/29/2022   CO2 27 03/29/2022   Lab Results  Component Value Date   ALT 18 10/03/2022   AST 18 10/03/2022   ALKPHOS 59 10/03/2022   BILITOT 0.9 10/03/2022   Lab Results  Component Value Date   CHOL 139 10/03/2022   HDL 53.90 10/03/2022   LDLCALC 69 10/03/2022   TRIG 81.0 10/03/2022   CHOLHDL 3 10/03/2022    Lab Results  Component Value Date   HGBA1C 5.8 10/03/2022    Assessment & Plan   1. Paroxysmal atrial flutter: EKG today shows atrial flutter, rate controlled.  He does note mild intermittent chest tightness and dyspnea on exertion, similar to prior episodes of atrial fibrillation/flutter. CHA2DS2-VASc Score = 1 .  Will start Eliquis 5 mg twice daily.  Will have him return in 3 weeks.  If he remains in atrial fibrillation/flutter, can consider DCCV at that time. Will check CBC, CMET, TSH, and Mg.  Reviewed ED precautions. Continue metoprolol. { 2. Elevated coronary artery calcium score: Coronary calcium score in 04/2022 was 56.7 (37.7 the percentile). Stable with no anginal symptoms. No indication for ischemic evaluation.  I advised him to hold his aspirin in the setting of new Eliquis as above.  Continue metoprolol, Crestor.  3. Mitral  valve regurgitation:  Echocardiogram in 03/2021 showed EF 60 to 65%, normal mild mitral valve regurgitation.  Repeat echocardiogram was recommended in 2025.   4. Hyperlipidemia: LDL was 69 in 10/2022. Continue Crestor.   5. Disposition: Follow-up in 3 weeks.       Joylene Grapes, NP 11/18/2022, 9:26 AM

## 2022-11-18 NOTE — H&P (View-Only) (Signed)
Office Visit    Patient Name: Donald Wilkins Date of Encounter: 11/18/2022  Primary Care Provider:  Pincus Sanes, MD Primary Cardiologist:  Christell Constant, MD  Chief Complaint    72 year old male with history of paroxysmal atrial flutter, elevated coronary artery calcium score, mitral valve regurgitation, and hyperlipidemia who presents for follow-up related to atrial flutter.  Past Medical History    Past Medical History:  Diagnosis Date   A-fib (HCC)    Basal cell cancer    X 2;[facility]   Hyperlipidemia    Incomplete right bundle branch block    MVP (mitral valve prolapse)    on ECHO   Past Surgical History:  Procedure Laterality Date   ANAL FISSURE REPAIR     BASAL CELL CARCINOMA EXCISION  2011 & 2012   Mohs L face; back   CARDIOVERSION N/A 04/24/2020   Procedure: CARDIOVERSION;  Surgeon: Christell Constant, MD;  Location: [facility];  Service: Cardiovascular;  Laterality: N/A;   COLONOSCOPY     colonoscopy with polypectomy  03/05/2003   [facility], Dr Arlyce Dice; negative 2010    Allergies  No Known Allergies   Labs/Other Studies Reviewed    The following studies were reviewed today:  Cardiac Studies & Procedures       ECHOCARDIOGRAM  ECHOCARDIOGRAM COMPLETE 03/27/2020  Narrative ECHOCARDIOGRAM REPORT    Patient Name:   Donald Wilkins [facility]   Date of Exam: 03/27/2020 Medical Rec #:  [id number]     Height:       70.0 in Accession #:    [id number]    Weight:       200.0 lb Date of Birth:  1950-05-30     BSA:          2.087 m Patient Age:    69 years      BP:           141/88 mmHg Patient Gender: M             HR:           99 bpm. Exam Location:  [address]  Procedure: 2D Echo, Cardiac Doppler and Color Doppler  Indications:    I48.92 Atrial flutter  History:        Patient has no prior history of Echocardiogram examinations. Mitral Valve Prolapse, Signs/Symptoms:Palpitations; Risk Factors:Hypertension.  Sonographer:    Clearence Ped  RCS Referring Phys: [id number] MAHESH A CHANDRASEKHAR  IMPRESSIONS   1. Left ventricular ejection fraction, by estimation, is 60 to 65%. The left ventricle has normal function. The left ventricle has no regional wall motion abnormalities. Left ventricular diastolic function could not be evaluated. 2. Right ventricular systolic function is normal. The right ventricular size is normal. There is normal pulmonary artery systolic pressure. The estimated right ventricular systolic pressure is 20.3 mmHg. 3. Left atrial size was moderately dilated. 4. The mitral valve is normal in structure. Mild mitral valve regurgitation. No evidence of mitral stenosis. 5. Tricuspid valve regurgitation is mild to moderate. 6. The aortic valve is normal in structure. Aortic valve regurgitation is trivial. No aortic stenosis is present. 7. The inferior vena cava is normal in size with greater than 50% respiratory variability, suggesting right atrial pressure of 3 mmHg.  FINDINGS Left Ventricle: Left ventricular ejection fraction, by estimation, is 60 to 65%. The left ventricle has normal function. The left ventricle has no regional wall motion abnormalities. The left ventricular internal cavity size was normal in size. There is  no left ventricular hypertrophy. Left ventricular diastolic function could not be evaluated due to atrial fibrillation. Left ventricular diastolic function could not be evaluated.  Right Ventricle: The right ventricular size is normal. No increase in right ventricular wall thickness. Right ventricular systolic function is normal. There is normal pulmonary artery systolic pressure. The tricuspid regurgitant velocity is 2.08 m/s, and with an assumed right atrial pressure of 3 mmHg, the estimated right ventricular systolic pressure is 20.3 mmHg.  Left Atrium: Left atrial size was moderately dilated.  Right Atrium: Right atrial size was normal in size.  Pericardium: There is no evidence of  pericardial effusion.  Mitral Valve: The mitral valve is normal in structure. There is mild thickening of the mitral valve leaflet(s). There is mild calcification of the mitral valve leaflet(s). Mild mitral valve regurgitation, with centrally-directed jet. No evidence of mitral valve stenosis.  Tricuspid Valve: The tricuspid valve is normal in structure. Tricuspid valve regurgitation is mild to moderate. No evidence of tricuspid stenosis.  Aortic Valve: The aortic valve is normal in structure. Aortic valve regurgitation is trivial. No aortic stenosis is present.  Pulmonic Valve: The pulmonic valve was normal in structure. Pulmonic valve regurgitation is trivial. No evidence of pulmonic stenosis.  Aorta: The aortic root is normal in size and structure.  Venous: The inferior vena cava is normal in size with greater than 50% respiratory variability, suggesting right atrial pressure of 3 mmHg.  IAS/Shunts: No atrial level shunt detected by color flow Doppler.   LEFT VENTRICLE PLAX 2D LVIDd:         4.50 cm  Diastology LVIDs:         3.10 cm  LV e' medial:    11.10 cm/s LV PW:         1.10 cm  LV E/e' medial:  7.7 LV IVS:        0.90 cm  LV e' lateral:   13.20 cm/s LVOT diam:     2.00 cm  LV E/e' lateral: 6.5 LV SV:         41 LV SV Index:   20 LVOT Area:     3.14 cm   RIGHT VENTRICLE RV Basal diam:  3.90 cm RV S prime:     12.70 cm/s TAPSE (M-mode): 2.0 cm RVSP:           20.3 mmHg  LEFT ATRIUM             Index       RIGHT ATRIUM           Index LA diam:        3.40 cm 1.63 cm/m  RA Pressure: 3.00 mmHg LA Vol (A2C):   35.1 ml 16.82 ml/m RA Area:     17.20 cm LA Vol (A4C):   55.2 ml 26.45 ml/m RA Volume:   47.40 ml  22.71 ml/m LA Biplane Vol: 46.3 ml 22.18 ml/m AORTIC VALVE LVOT Vmax:   68.50 cm/s LVOT Vmean:  48.867 cm/s LVOT VTI:    0.131 m  AORTA Ao Root diam: 3.30 cm Ao Asc diam:  2.80 cm  MITRAL VALVE               TRICUSPID VALVE TR Peak grad:   17.3  mmHg MV Decel Time:             TR Vmax:        208.00 cm/s MR Peak grad: 106.5 mmHg   Estimated RAP:  3.00 mmHg MR  Mean grad: 70.0 mmHg    RVSP:           20.3 mmHg MR Vmax:      516.00 cm/s MR Vmean:     393.0 cm/s   SHUNTS MV E velocity: 85.45 cm/s  Systemic VTI:  0.13 m Systemic Diam: 2.00 cm  Tobias Alexander MD Electronically signed by Tobias Alexander MD Signature Date/Time: 03/27/2020/7:39:31 PM    Final     CT SCANS  CT CARDIAC SCORING (SELF PAY ONLY) 04/17/2022  Addendum 04/20/2022  2:45 PM ADDENDUM REPORT: 04/20/2022 14:43  EXAM: OVER-READ INTERPRETATION  CT CHEST  The following report is an over-read performed by radiologist Dr. Elnoria Howard [facility] on 04/20/2022. This over-read does not include interpretation of cardiac or coronary anatomy or pathology. The coronary calcium score interpretation by the cardiologist is attached.  COMPARISON:  None.  FINDINGS: Small amount of linear scarring in the left lower lung zone. No lung nodules visualized. Unremarkable bones. Separately described cardiovascular findings.  IMPRESSION: No significant non cardiovascular abnormalities.   Electronically Signed By: Beckie Salts M.D. On: 04/20/2022 14:43  Narrative CLINICAL DATA:  Risk stratification  EXAM: Coronary Calcium Score  TECHNIQUE: The patient was scanned on a Siemens Somatom 64 slice scanner. Axial non-contrast 3mm slices were carried out through the heart. The data set was analyzed on a dedicated work station and scored using the Agatson method.  FINDINGS: Non-cardiac: No significant non cardiac findings on limited lung and soft tissue windows. See separate report from [facility].  Ascending Aorta: Normal diameter 3.3 cm  Pericardium: Normal  Coronary arteries: Calcium noted primarily in the LAD  LM 0  LAD 56.2  LCX 0.557  RCA 0  Total 56.7  IMPRESSION: Coronary calcium score of 56.7. This was 63 th  percentile for age and sex matched control.  Charlton Haws  Electronically Signed: By: Charlton Haws M.D. On: 04/17/2022 12:16         Recent Labs: 03/29/2022: BUN 12; Creatinine, Ser 0.77; Hemoglobin 14.0; Platelets 203.0; Potassium 4.4; Sodium 137; TSH 1.30 10/03/2022: ALT 18  Recent Lipid Panel    Component Value Date/Time   CHOL 139 10/03/2022 0918   TRIG 81.0 10/03/2022 0918   HDL 53.90 10/03/2022 0918   CHOLHDL 3 10/03/2022 0918   VLDL 16.2 10/03/2022 0918   LDLCALC 69 10/03/2022 0918    History of Present Illness    72 year old male with the above past medical history including paroxysmal atrial flutter, elevated coronary artery calcium score, mitral valve regurgitation, and hyperlipidemia.  He has a history of paroxysmal atrial flutter s/p prior DCCV in 2022.  Prior to his cardioversion he has been dyspnea on exertion.  He in not on anticoagulation given low CHA2DS2-VASc score.  Echocardiogram in 03/2021 showed EF 60 to 65%, normal mild mitral valve regurgitation.  Repeat echocardiogram was recommended in 2025. Coronary calcium score in 04/2022 was 56.7 (37.7 the percentile). He was last seen in the office on 02/13/2022 and was doing well from a cardiac standpoint.  He contacted our office on 11/13/2022 with concern for recurrent atrial fibrillation.  He presents today for follow-up.  Since his last visit he has stable from a cardiac standpoint.  He started feeling some tightness in his chest and mild dyspnea on exertion.  He checked his heart rate/rhythm with his Kardia mobile device which notified him of recurrent atrial fibrillation.  He started taking an old prescription of Eliquis 5 mg twice daily.  He continues to  note mild dyspnea with exertion, otherwise, he denies any new symptoms or concerns.  He is interested in pursuing DCCV.  Home Medications    Current Outpatient Medications  Medication Sig Dispense Refill   apixaban (ELIQUIS) 5 MG TABS tablet Take 1 tablet (5 mg  total) by mouth 2 (two) times daily. 60 tablet 5   aspirin EC 81 MG tablet Take 1 tablet (81 mg total) by mouth daily. Swallow whole. 90 tablet 3   diclofenac sodium (VOLTAREN) 1 % GEL Apply 4 g topically 4 (four) times daily. 100 g 5   finasteride (PROSCAR) 5 MG tablet TAKE 1/2 TABLET BY MOUTH EVERY 2 DAYS 90 tablet 3   FLUoxetine (PROZAC) 10 MG capsule TAKE 1 CAPSULE BY MOUTH EVERY DAY 90 capsule 2   metoprolol tartrate (LOPRESSOR) 25 MG tablet Take 0.5 tablets (12.5 mg total) by mouth 2 (two) times daily. 90 tablet 3   Multiple Vitamin (MULTIVITAMIN WITH MINERALS) TABS tablet Take 1 tablet by mouth daily. Centrum Silver     Omega-3 Fatty Acids (SALMON OIL-1000 PO) Take 1,000 mg by mouth daily.     rosuvastatin (CRESTOR) 5 MG tablet Take 1 tablet (5 mg total) by mouth daily. 90 tablet 3   sildenafil (REVATIO) 20 MG tablet Take 2-5 pills as needed 40 tablet 5   Turmeric (QC TUMERIC COMPLEX PO) Take by mouth.     No current facility-administered medications for this visit.     Review of Systems    He denies chest pain, palpitations, pnd, orthopnea, n, v, dizziness, syncope, edema, weight gain, or early satiety. All other systems reviewed and are otherwise negative except as noted above.   Physical Exam    VS:  BP 104/74   Pulse 74   Ht 5\' 10"  (1.778 m)   Wt 194 lb (88 kg)   SpO2 98%   BMI 27.84 kg/m  GEN: Well nourished, well developed, in no acute distress. HEENT: normal. Neck: Supple, no JVD, carotid bruits, or masses. Cardiac: RRR, no murmurs, rubs, or gallops. No clubbing, cyanosis, edema.  Radials/DP/PT 2+ and equal bilaterally.  Respiratory:  Respirations regular and unlabored, clear to auscultation bilaterally. GI: Soft, nontender, nondistended, BS + x 4. MS: no deformity or atrophy. Skin: warm and dry, no rash. Neuro:  Strength and sensation are intact. Psych: Normal affect.  Accessory Clinical Findings    ECG personally reviewed by me today - EKG  Interpretation Date/Time:  Monday November 18 2022 08:53:32 EDT Ventricular Rate:  74 PR Interval:    QRS Duration:  104 QT Interval:  418 QTC Calculation: 463 R Axis:   -3  Text Interpretation: Atrial flutter with variable A-V block When compared with ECG of 24-Apr-2020 11:45, Atrial flutter has replaced Sinus rhythm Vent. rate has increased BY  24 BPM QT has lengthened Confirmed by [name] ([id number]) on 11/18/2022 9:02:23 AM  - no acute changes.   Lab Results  Component Value Date   WBC 5.0 03/29/2022   HGB 14.0 03/29/2022   HCT 41.1 03/29/2022   MCV 93.0 03/29/2022   PLT 203.0 03/29/2022   Lab Results  Component Value Date   CREATININE 0.77 03/29/2022   BUN 12 03/29/2022   NA 137 03/29/2022   K 4.4 03/29/2022   CL 102 03/29/2022   CO2 27 03/29/2022   Lab Results  Component Value Date   ALT 18 10/03/2022   AST 18 10/03/2022   ALKPHOS 59 10/03/2022   BILITOT 0.9 10/03/2022   Lab Results  Component Value Date   CHOL 139 10/03/2022   HDL 53.90 10/03/2022   LDLCALC 69 10/03/2022   TRIG 81.0 10/03/2022   CHOLHDL 3 10/03/2022    Lab Results  Component Value Date   HGBA1C 5.8 10/03/2022    Assessment & Plan   1. Paroxysmal atrial flutter: EKG today shows atrial flutter, rate controlled.  He does note mild intermittent chest tightness and dyspnea on exertion, similar to prior episodes of atrial fibrillation/flutter. CHA2DS2-VASc Score = 1 .  Will start Eliquis 5 mg twice daily.  Will have him return in 3 weeks.  If he remains in atrial fibrillation/flutter, can consider DCCV at that time. Will check CBC, CMET, TSH, and Mg.  Reviewed ED precautions. Continue metoprolol. { 2. Elevated coronary artery calcium score: Coronary calcium score in 04/2022 was 56.7 (37.7 the percentile). Stable with no anginal symptoms. No indication for ischemic evaluation.  I advised him to hold his aspirin in the setting of new Eliquis as above.  Continue metoprolol, Crestor.  3. Mitral  valve regurgitation:  Echocardiogram in 03/2021 showed EF 60 to 65%, normal mild mitral valve regurgitation.  Repeat echocardiogram was recommended in 2025.   4. Hyperlipidemia: LDL was 69 in 10/2022. Continue Crestor.   5. Disposition: Follow-up in 3 weeks.       Joylene Grapes, NP 11/18/2022, 9:26 AM

## 2022-11-18 NOTE — Patient Instructions (Signed)
Medication Instructions:  Start Eliquis 5 mg twice daily  *If you need a refill on your cardiac medications before your next appointment, please call your pharmacy*   Lab Work: CBC, CMET, TSH, Magnesium today   Testing/Procedures: NONE ordered at this time of appointment     Follow-Up: At [facility], you and your health needs are our priority.  As part of our continuing mission to provide you with exceptional heart care, we have created designated Provider Care Teams.  These Care Teams include your primary Cardiologist (physician) and Advanced Practice Providers (APPs -  Physician Assistants and Nurse Practitioners) who all work together to provide you with the care you need, when you need it.  We recommend signing up for the patient portal called "MyChart".  Sign up information is provided on this After Visit Summary.  MyChart is used to connect with patients for Virtual Visits (Telemedicine).  Patients are able to view lab/test results, encounter notes, upcoming appointments, etc.  Non-urgent messages can be sent to your provider as well.   To learn more about what you can do with MyChart, go to [email or web address].    Your next appointment:   3 week(s)  Provider:   Any app at [facility] [address] or [employer]

## 2022-11-19 LAB — COMPREHENSIVE METABOLIC PANEL
ALT: 22 IU/L (ref 0–44)
AST: 24 IU/L (ref 0–40)
Albumin: 4.4 g/dL (ref 3.9–4.9)
Alkaline Phosphatase: 71 IU/L (ref 44–121)
BUN/Creatinine Ratio: 15 (ref 10–24)
BUN: 14 mg/dL (ref 8–27)
Bilirubin Total: 0.7 mg/dL (ref 0.0–1.2)
CO2: 25 mmol/L (ref 20–29)
Calcium: 9.9 mg/dL (ref 8.6–10.2)
Chloride: 103 mmol/L (ref 96–106)
Creatinine, Ser: 0.95 mg/dL (ref 0.76–1.27)
Globulin, Total: 2.3 g/dL (ref 1.5–4.5)
Glucose: 73 mg/dL (ref 70–99)
Potassium: 5.2 mmol/L (ref 3.5–5.2)
Sodium: 139 mmol/L (ref 134–144)
Total Protein: 6.7 g/dL (ref 6.0–8.5)
eGFR: 86 mL/min/{1.73_m2} (ref >59)

## 2022-11-19 LAB — CBC
Hematocrit: 44 % (ref 37.5–51.0)
Hemoglobin: 15.1 g/dL (ref 13.0–17.7)
MCH: 32.5 pg (ref 26.6–33.0)
MCHC: 34.3 g/dL (ref 31.5–35.7)
MCV: 95 fL (ref 79–97)
Platelets: 243 10*3/uL (ref 150–450)
RBC: 4.64 x10E6/uL (ref 4.14–5.80)
RDW: 12.1 % (ref 11.6–15.4)
WBC: 6.6 10*3/uL (ref 3.4–10.8)

## 2022-11-19 LAB — MAGNESIUM: Magnesium: 2.2 mg/dL (ref 1.6–2.3)

## 2022-11-19 LAB — TSH: TSH: 1.29 u[IU]/mL (ref 0.450–4.500)

## 2022-12-05 ENCOUNTER — Encounter: Payer: Self-pay | Admitting: Internal Medicine

## 2022-12-05 NOTE — Progress Notes (Unsigned)
Subjective:    Patient ID: Donald Wilkins, male    DOB: November 02, 1950, 72 y.o.   MRN: [id number]      HPI Donald Wilkins is here for No chief complaint on file.    Larynx sore x 3 weeks, sometimes it affects his voice, especially when he tries to talk loud. Has scratchy all the time.    Denies any recent cold symptoms.   He takes Tums fairly often - nothing new.  Takes a couple of Tums every couple of days.     Has been out of rhythm with his heart for the past 2-3 weeks.  She is following with cardio   Fluid, rest voice, humidifyer, sterd  Medications and allergies reviewed with patient and updated if appropriate.  Current Outpatient Medications on File Prior to Visit  Medication Sig Dispense Refill   apixaban (ELIQUIS) 5 MG TABS tablet Take 1 tablet (5 mg total) by mouth 2 (two) times daily. 60 tablet 5   aspirin EC 81 MG tablet Take 1 tablet (81 mg total) by mouth daily. Swallow whole. 90 tablet 3   diclofenac sodium (VOLTAREN) 1 % GEL Apply 4 g topically 4 (four) times daily. 100 g 5   finasteride (PROSCAR) 5 MG tablet TAKE 1/2 TABLET BY MOUTH EVERY 2 DAYS 90 tablet 3   FLUoxetine (PROZAC) 10 MG capsule TAKE 1 CAPSULE BY MOUTH EVERY DAY 90 capsule 2   metoprolol tartrate (LOPRESSOR) 25 MG tablet Take 0.5 tablets (12.5 mg total) by mouth 2 (two) times daily. 90 tablet 3   Multiple Vitamin (MULTIVITAMIN WITH MINERALS) TABS tablet Take 1 tablet by mouth daily. Centrum Silver     Omega-3 Fatty Acids (SALMON OIL-1000 PO) Take 1,000 mg by mouth daily.     rosuvastatin (CRESTOR) 5 MG tablet Take 1 tablet (5 mg total) by mouth daily. 90 tablet 3   sildenafil (REVATIO) 20 MG tablet Take 2-5 pills as needed 40 tablet 5   Turmeric (QC TUMERIC COMPLEX PO) Take by mouth.     No current facility-administered medications on file prior to visit.    Review of Systems  Constitutional:  Negative for fever.  HENT:  Positive for sore throat and voice change. Negative for congestion, ear pain,  postnasal drip, sinus pain and trouble swallowing.   Respiratory:  Negative for cough, shortness of breath and wheezing.   Cardiovascular:  Negative for chest pain (occ tightness  - usually when he overdoes it) and palpitations.  Neurological:  Positive for headaches (slight for past 2 weeks or so). Negative for dizziness and light-headedness.       Objective:   Vitals:   12/06/22 1424  BP: 112/62  Pulse: 89  Temp: 97.9 F (36.6 C)  SpO2: 97%   BP Readings from Last 3 Encounters:  12/06/22 112/62  11/18/22 104/74  05/08/22 124/70   Wt Readings from Last 3 Encounters:  12/06/22 192 lb (87.1 kg)  11/18/22 194 lb (88 kg)  05/08/22 199 lb 3.2 oz (90.4 kg)   Body mass index is 27.55 kg/m.    Physical Exam Constitutional:      General: He is not in acute distress.    Appearance: Normal appearance. He is not ill-appearing.  HENT:     Head: Normocephalic and atraumatic.     Right Ear: Tympanic membrane, ear canal and external ear normal. There is no impacted cerumen.     Left Ear: Tympanic membrane, ear canal and external ear normal. There is no impacted  cerumen.     Mouth/Throat:     Mouth: Mucous membranes are moist.     Pharynx: No oropharyngeal exudate or posterior oropharyngeal erythema.  Eyes:     Conjunctiva/sclera: Conjunctivae normal.  Cardiovascular:     Rate and Rhythm: Normal rate. Rhythm irregular.     Heart sounds: Normal heart sounds.  Pulmonary:     Effort: Pulmonary effort is normal. No respiratory distress.     Breath sounds: Normal breath sounds. No wheezing or rales.  Abdominal:     Palpations: Abdomen is soft.     Tenderness: There is no abdominal tenderness.  Musculoskeletal:     Cervical back: Neck supple. No tenderness.     Right lower leg: No edema.     Left lower leg: No edema.  Lymphadenopathy:     Cervical: No cervical adenopathy.  Skin:    General: Skin is warm and dry.     Findings: No rash.  Neurological:     Mental Status: He is  alert. Mental status is at baseline.  Psychiatric:        Mood and Affect: Mood normal.            Assessment & Plan:    See Problem List for Assessment and Plan of chronic medical problems.

## 2022-12-05 NOTE — Patient Instructions (Addendum)
      Medications changes include :   pantoprazole 20 mg daily - take 30 minutes prior to a meal    A referral was ordered for ENT and someone will call you to schedule an appointment.

## 2022-12-06 ENCOUNTER — Encounter: Payer: Self-pay | Admitting: Internal Medicine

## 2022-12-06 ENCOUNTER — Ambulatory Visit (INDEPENDENT_AMBULATORY_CARE_PROVIDER_SITE_OTHER): Payer: Medicare HMO | Admitting: Internal Medicine

## 2022-12-06 VITALS — BP 112/62 | HR 89 | Temp 97.9°F | Ht 70.0 in | Wt 192.0 lb

## 2022-12-06 DIAGNOSIS — J060 Acute laryngopharyngitis: Secondary | ICD-10-CM | POA: Diagnosis not present

## 2022-12-06 MED ORDER — PANTOPRAZOLE SODIUM 20 MG PO TBEC: 20.0000 mg | DELAYED_RELEASE_TABLET | Freq: Every day | ORAL | 5 refills | Status: DC

## 2022-12-06 NOTE — Progress Notes (Unsigned)
Cardiology Office Note:    Date:  12/09/2022  ID:  Donald Wilkins, DOB February 04, 1951, MRN [id number] PCP: Pincus Sanes, MD  [facility] Providers Cardiologist:  Christell Constant, MD       Patient Profile:      Paroxysmal atrial fibrillation/flutter Not on anticoagulation secondary to low stroke risk Incomplete right bundle branch block Coronary artery calcification CAC score 04/17/2022: 56.7 (37 percentile) Mitral valve prolapse Mitral regurgitation TTE 03/27/2020: EF 60-65, no RWMA, normal RVSF, normal PASP, RVSP 20.3, moderate LAE, mild MR, mild-moderate TR, trivial AI, RAP 3 Hyperlipidemia         History of Present Illness:  Discussed the use of AI scribe software for clinical note transcription with the patient, who gave verbal consent to proceed.  Donald Wilkins is a 72 y.o. male who returns for follow up of AFlutter. Patient was last seen 11/18/2022 by Donald Person, NP for recurrent atrial flutter with associated chest pain or shortness of breath.  He was placed on Eliquis 5 mg twice daily for anticoagulation.  He returns for follow-up with an eye towards cardioversion if he remains in atrial flutter.   He is here alone.  The patient reports that he was aware of being back in atrial flutter due to increased breathlessness, especially when climbing stairs or bending down to pick up things. This is the second episode of atrial flutter, the first one having occurred over two years ago and resolved with cardioversion. The patient started experiencing symptoms of being out of rhythm about three to four weeks ago. The patient has been on Eliquis for three weeks without interruption. 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 He has noticed that alcohol consumption causes tightness in his chest, so he has switched to non-alcoholic beverages. He has not had syncope, swelling in the legs, bleeding, black stools, or bloody urine.      Review of Systems   Gastrointestinal:  Negative for hematochezia and melena.  Genitourinary:  Negative for hematuria.  See HPI    Studies Reviewed:   EKG Interpretation Date/Time:  Monday December 09 2022 10:55:01 EDT Ventricular Rate:  70 PR Interval:    QRS Duration:  108 QT Interval:  412 QTC Calculation: 444 R Axis:   6  Text Interpretation: Atrial flutter with variable A-V block Low voltage QRS No significant change since last tracing Confirmed by Donald Wilkins ([id number]) on 12/09/2022 11:02:11 AM    Risk Assessment/Calculations:    CHA2DS2-VASc Score = 2   This indicates a 2.2% annual risk of stroke. The patient's score is based upon: CHF History: 0 HTN History: 0 Diabetes History: 0 Stroke History: 0 Vascular Disease History: 1 Age Score: 1 Gender Score: 0            Physical Exam:   VS:  BP 122/74   Pulse 70   Ht 5\' 10"  (1.778 m)   Wt 194 lb (88 kg)   SpO2 98%   BMI 27.84 kg/m    Wt Readings from Last 3 Encounters:  12/09/22 194 lb (88 kg)  12/06/22 192 lb (87.1 kg)  11/18/22 194 lb (88 kg)    Constitutional:      Appearance: Healthy appearance. Not in distress.  Neck:     Vascular: No JVR. JVD normal.  Pulmonary:     Breath sounds: Normal breath sounds. No wheezing. No rales.  Cardiovascular:     Normal rate. Regular rhythm.  Murmurs: There is no murmur.  Edema:    Peripheral edema absent.  Abdominal:     Palpations: Abdomen is soft.  Skin:    General: Skin is warm and dry.        Assessment and Plan:   Assessment & Plan Paroxysmal atrial flutter (HCC) He appears to have counterclockwise typical atrial flutter on EKG with controlled ventricular rate. He has been symptomatic in the past, but no recent chest pain or shortness of breath since last OV. He has been on Eliquis for 3 weeks without interruption. CHADS-VASc score is now 2, indicating need for long term anticoagulation. -Schedule cardioversion. -Continue Eliquis 5mg  twice daily, Metoprolol Tartrate  12.5mg  twice daily. -Refer to electrophysiology for follow up after cardioversion to see if he is a candidate for ablation. Coronary artery disease involving native coronary artery of native heart without angina pectoris Coronary calcium score 56.10 April 2022 placing him in the 37th percentile.  He has not had chest discomfort suggestive of angina.  He does not require antiplatelet therapy as he is on Eliquis. -Continue Crestor 5mg  daily. Nonrheumatic mitral valve regurgitation Mild mitral regurgitation noted on echocardiogram in January 2022. -Consider repeat echocardiogram within the next 12 months.    Informed Consent   Shared Decision Making/Informed Consent The risks (stroke, cardiac arrhythmias rarely resulting in the need for a temporary or permanent pacemaker, skin irritation or burns and complications associated with conscious sedation including aspiration, arrhythmia, respiratory failure and death), benefits (restoration of normal sinus rhythm) and alternatives of a direct current cardioversion were explained in detail to [name] and he agrees to proceed.       Dispo:  Return for Post Procedure Follow Up w/ EP .  Signed, Donald Newcomer, PA-C

## 2022-12-07 DIAGNOSIS — J060 Acute laryngopharyngitis: Secondary | ICD-10-CM | POA: Insufficient documentation

## 2022-12-07 NOTE — Assessment & Plan Note (Signed)
Acute Persistent for 2-3 weeks No cold symptoms preceding sore throat Does have frequent GERD symptoms and concerned his sore throat and hoarseness are related to GERD Start pantoprazole 20 mg daily-discussed we do not want this to be long-term, but needs to take now to see if this improves his symptoms Referral to ENT

## 2022-12-09 ENCOUNTER — Encounter: Payer: Self-pay | Admitting: Physician Assistant

## 2022-12-09 ENCOUNTER — Ambulatory Visit: Payer: Medicare HMO | Attending: Physician Assistant | Admitting: Physician Assistant

## 2022-12-09 VITALS — BP 122/74 | HR 70 | Ht 70.0 in | Wt 194.0 lb

## 2022-12-09 DIAGNOSIS — I2510 Atherosclerotic heart disease of native coronary artery without angina pectoris: Secondary | ICD-10-CM | POA: Diagnosis not present

## 2022-12-09 DIAGNOSIS — I340 Nonrheumatic mitral (valve) insufficiency: Secondary | ICD-10-CM | POA: Diagnosis not present

## 2022-12-09 DIAGNOSIS — I4892 Unspecified atrial flutter: Secondary | ICD-10-CM | POA: Diagnosis not present

## 2022-12-09 NOTE — Assessment & Plan Note (Signed)
Coronary calcium score 56.10 April 2022 placing him in the 37th percentile.  He has not had chest discomfort suggestive of angina.  He does not require antiplatelet therapy as he is on Eliquis. -Continue Crestor 5mg  daily.

## 2022-12-09 NOTE — Patient Instructions (Addendum)
Medication Instructions:  Your physician recommends that you continue on your current medications as directed. Please refer to the Current Medication list given to you today.  *If you need a refill on your cardiac medications before your next appointment, please call your pharmacy*   Lab Work: None ordered  If you have labs (blood work) drawn today and your tests are completely normal, you will receive your results only by: MyChart Message (if you have MyChart) OR A paper copy in the mail If you have any lab test that is abnormal or we need to change your treatment, we will call you to review the results.   Testing/Procedures: Your physician has recommended that you have a Cardioversion (DCCV). Electrical Cardioversion uses a jolt of electricity to your heart either through paddles or wired patches attached to your chest. This is a controlled, usually prescheduled, procedure. Defibrillation is done under light anesthesia in the hospital, and you usually go home the day of the procedure. This is done to get your heart back into a normal rhythm. You are not awake for the procedure. Please see the instruction sheet given to you BELOW:      Dear Donald Wilkins  You are scheduled for a Cardioversion on Wednesday, October 9 with Dr. Duke Salvia.  Please arrive at the [facility] (Main Entrance A) at [facility]: [address] [place], [place] [address] at 12:00 PM (This time is 2 hour(s) before your procedure to ensure your preparation). Free valet parking service is available. You will check in at ADMITTING. The support person will be asked to wait in the waiting room.  It is OK to have someone drop you off and come back when you are ready to be discharged.      DIET:  Nothing to eat or drink after midnight except a sip of water with medications (see medication instructions below)  MEDICATION INSTRUCTIONS: !!IF ANY NEW MEDICATIONS ARE STARTED AFTER TODAY, PLEASE NOTIFY YOUR PROVIDER AS SOON  AS POSSIBLE!!  FYI: Medications such as Semaglutide (Ozempic, [place]), Tirzepatide (Mounjaro, Zepbound), Dulaglutide (Trulicity), etc ("GLP1 agonists") AND Canagliflozin (Invokana), Dapagliflozin (Farxiga), Empagliflozin (Jardiance), Ertugliflozin (Steglatro), Bexagliflozin [employer]) or any combination with one of these drugs such as Invokamet (Canagliflozin/Metformin), Synjardy (Empagliflozin/Metformin), etc ("SGLT2 inhibitors") must be held around the time of a procedure. This is not a comprehensive list of all of these drugs. Please review all of your medications and talk to your provider if you take any one of these. If you are not sure, ask your provider.     Continue taking your anticoagulant (blood thinner): Apixaban (Eliquis).  You will need to continue this after your procedure until you are told by your provider that it is safe to stop.    LABS:   FYI:  For your safety, and to allow [place] to monitor your vital signs accurately during the surgery/procedure we request: If you have artificial nails, gel coating, SNS etc, please have those removed prior to your surgery/procedure. Not having the nail coverings /polish removed may result in cancellation or delay of your surgery/procedure.  You must have a responsible person to drive you home and stay in the waiting area during your procedure. Failure to do so could result in cancellation.  Bring your insurance cards.  *Special Note: Every effort is made to have your procedure done on time. Occasionally there are emergencies that occur at the hospital that may cause delays. Please be patient if a delay does occur.  You have been referred to ELECTROPHYSIOLOGY DEPARTMENT FOR A-FIB.  NEED TO SCHEDULE WITHIN 3-4 WEEKS.   Follow-Up: At [facility], you and your health needs are our priority.  As part of our continuing mission to provide you with exceptional heart care, we have created designated Provider Care Teams.  These  Care Teams include your primary Cardiologist (physician) and Advanced Practice Providers (APPs -  Physician Assistants and Nurse Practitioners) who all work together to provide you with the care you need, when you need it.  We recommend signing up for the patient portal called "MyChart".  Sign up information is provided on this After Visit Summary.  MyChart is used to connect with patients for Virtual Visits (Telemedicine).  Patients are able to view lab/test results, encounter notes, upcoming appointments, etc.  Non-urgent messages can be sent to your provider as well.   To learn more about what you can do with MyChart, go to [email or web address].    Your next appointment:    Other Instructions

## 2022-12-09 NOTE — Assessment & Plan Note (Signed)
He appears to have counterclockwise typical atrial flutter on EKG with controlled ventricular rate. He has been symptomatic in the past, but no recent chest pain or shortness of breath since last OV. He has been on Eliquis for 3 weeks without interruption. CHADS-VASc score is now 2, indicating need for long term anticoagulation. -Schedule cardioversion. -Continue Eliquis 5mg  twice daily, Metoprolol Tartrate 12.5mg  twice daily. -Refer to electrophysiology for follow up after cardioversion to see if he is a candidate for ablation.

## 2022-12-10 NOTE — Anesthesia Preprocedure Evaluation (Signed)
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 Left ventricular ejection fraction, by estimation, is 60 to 65%. The  left ventricle has normal function. The left ventricle has no regional  wall motion abnormalities. Left ventricular diastolic function could not  be evaluated.   2. Right ventricular systolic function is normal. The right ventricular  size is normal. There is normal pulmonary artery systolic pressure. The  estimated right ventricular systolic pressure is 20.3 mmHg.   3. Left atrial size was moderately dilated.   4. The mitral valve is normal in structure. Mild mitral valve  regurgitation. No evidence of mitral stenosis.   5. Tricuspid valve regurgitation is mild to moderate.   6. The aortic valve is normal in structure. Aortic valve regurgitation is  trivial. No aortic stenosis is present.   7. The inferior vena cava is normal in size with greater than 50%  respiratory variability, suggesting right atrial pressure of 3 mmHg.     Neuro/Psych  PSYCHIATRIC DISORDERS  Depression    negative neurological ROS     GI/Hepatic Neg liver ROS,GERD  Medicated,,  Endo/Other   Pre-diabetes  Renal/GU negative Renal ROS     Musculoskeletal   Abdominal   Peds  Hematology negative hematology ROS (+)   Anesthesia Other Findings Last Eliquis: this morning  Reproductive/Obstetrics                             Anesthesia Physical Anesthesia Plan  ASA: 3  Anesthesia Plan: General   Post-op Pain Management:    Induction: Intravenous  PONV Risk Score and Plan: 2 and Treatment may vary due to age or medical condition  Airway Management Planned: Natural Airway and Nasal Cannula  Additional Equipment:   Intra-op Plan:   Post-operative Plan:   Informed Consent: I have reviewed the patients History and Physical, chart, labs and discussed the procedure including the risks, benefits and alternatives for the proposed anesthesia with the patient or authorized representative who has indicated his/her understanding and acceptance.     Dental advisory given  Plan Discussed with: Anesthesiologist and CRNA  Anesthesia Plan Comments: (Risks of general anesthesia discussed including, but not limited to, sore throat, hoarse voice, chipped/damaged teeth, injury to vocal cords, nausea and vomiting, allergic reactions, lung infection, heart attack, stroke, and death. All questions answered. )       Anesthesia Quick Evaluation

## 2022-12-10 NOTE — Progress Notes (Signed)
Left message for patient on phone regarding procedure tomorrow.  Instructed to arrive at 1200 pm, NPO after midnight.  Instructed patient to have a ride home and a responsible person to stay with patient for 24 hours after the procedure  Instructed patient to not miss any doses of Eliquis and to take the morning of procedure with a sip of water and he may take morning prescriptions with a sip of water.

## 2022-12-11 ENCOUNTER — Ambulatory Visit (HOSPITAL_COMMUNITY): Payer: Medicare HMO | Admitting: Anesthesiology

## 2022-12-11 ENCOUNTER — Encounter (HOSPITAL_COMMUNITY): Payer: Self-pay | Admitting: Cardiovascular Disease

## 2022-12-11 ENCOUNTER — Ambulatory Visit (HOSPITAL_BASED_OUTPATIENT_CLINIC_OR_DEPARTMENT_OTHER): Payer: Medicare HMO | Admitting: Anesthesiology

## 2022-12-11 ENCOUNTER — Other Ambulatory Visit: Payer: Self-pay

## 2022-12-11 ENCOUNTER — Encounter (HOSPITAL_COMMUNITY)
Admission: RE | Disposition: A | Discharge status: Home / Self Care | Payer: Self-pay | Source: Home / Self Care | Attending: Cardiovascular Disease

## 2022-12-11 ENCOUNTER — Ambulatory Visit (HOSPITAL_COMMUNITY)
Admission: RE | Admit: 2022-12-11 | Discharge: 2022-12-11 | Disposition: A | Discharge status: Home / Self Care | Payer: Medicare HMO | Attending: Cardiovascular Disease | Admitting: Cardiovascular Disease

## 2022-12-11 DIAGNOSIS — I4892 Unspecified atrial flutter: Secondary | ICD-10-CM | POA: Insufficient documentation

## 2022-12-11 DIAGNOSIS — Z79899 Other long term (current) drug therapy: Secondary | ICD-10-CM | POA: Insufficient documentation

## 2022-12-11 DIAGNOSIS — I4891 Unspecified atrial fibrillation: Secondary | ICD-10-CM | POA: Diagnosis not present

## 2022-12-11 DIAGNOSIS — Z7901 Long term (current) use of anticoagulants: Secondary | ICD-10-CM | POA: Insufficient documentation

## 2022-12-11 DIAGNOSIS — R0609 Other forms of dyspnea: Secondary | ICD-10-CM | POA: Diagnosis not present

## 2022-12-11 DIAGNOSIS — E785 Hyperlipidemia, unspecified: Secondary | ICD-10-CM | POA: Diagnosis not present

## 2022-12-11 DIAGNOSIS — I340 Nonrheumatic mitral (valve) insufficiency: Secondary | ICD-10-CM | POA: Diagnosis not present

## 2022-12-11 HISTORY — PX: CARDIOVERSION: SHX1299

## 2022-12-11 SURGERY — CARDIOVERSION
Anesthesia: General

## 2022-12-11 MED ORDER — SODIUM CHLORIDE 0.9 % IV SOLN: INTRAVENOUS | Status: DC

## 2022-12-11 MED ORDER — LIDOCAINE 2% (20 MG/ML) 5 ML SYRINGE
INTRAMUSCULAR | Status: DC | PRN
  Administered 2022-12-11: 60 mg via INTRAVENOUS

## 2022-12-11 MED ORDER — HYDROCORTISONE 1 % EX CREA: 1.0000 | TOPICAL_CREAM | Freq: Three times a day (TID) | CUTANEOUS | Status: DC | PRN

## 2022-12-11 MED ORDER — PROPOFOL 10 MG/ML IV BOLUS
INTRAVENOUS | Status: DC | PRN
  Administered 2022-12-11: 70 mg via INTRAVENOUS
  Administered 2022-12-11: 30 mg via INTRAVENOUS
  Administered 2022-12-11: 20 mg via INTRAVENOUS

## 2022-12-11 SURGICAL SUPPLY — 1 items: PAD DEFIB RADIO PHYSIO CONN (PAD) ×2 IMPLANT

## 2022-12-11 NOTE — Discharge Instructions (Signed)
 Electrical Cardioversion Electrical cardioversion is the delivery of a jolt of electricity to restore a normal rhythm to the heart. A rhythm that is too fast or is not regular (arrhythmia) keeps the heart from pumping blood well. There is also another type of cardioversion called a chemical (pharmacologic) cardioversion. This is when your health care provider gives you one or more medicines to bring back your regular heart rhythm. Electrical cardioversion is done as a scheduled procedure for arrhythmiasthat are not life-threatening. Electrical cardioversion may also be done in an emergency for sudden life-threatening arrhythmias. Tell a health care provider about: Any allergies you have. All medicines you are taking, including vitamins, herbs, eye drops, creams, and over-the-counter medicines. Any problems you or family members have had with sedatives or anesthesia. Any bleeding problems you have. Any surgeries you have had, including a pacemaker, defibrillator, or other implanted device. Any medical conditions you have. Whether you are pregnant or may be pregnant. What are the risks? Your provider will talk with you about risks. These include: Allergic reactions to medicines. Irritation to the skin on your chest or back where the sticky pads (electrodes) or paddles were put during electrical cardioversion. A blood clot that breaks free and travels to other parts of your body, such as your brain. Return of a worse abnormal heart rhythm that will need to be treated with medicines, a pacemaker, or an implantable cardioverter defibrillator (ICD). What happens before the procedure? Medicines Your provider may give you: Blood-thinning medicines (anticoagulants) so your blood does not clot as easily. If your provider gives you this medicine, you may need to take it for 4 weeks before the procedure. Medicines to help stabilize your heart rate and rhythm. Ask your provider about: Changing or stopping  your regular medicines. These include any diabetes medicines or blood thinners you take. Taking medicines such as aspirin and ibuprofen. These medicines can thin your blood. Do not take them unless your provider tells you to. Taking over-the-counter medicines, vitamins, herbs, and supplements. General instructions Follow instructions from your provider about what you may eat and drink. Do not put any lotions, powders, or ointments on your chest and back for 24 hours before the procedure. They can cause problems with the electrodes or paddles used to deliver electricity to your heart. Do not wear jewelry as this can interfere with delivering electricity to your heart. If you will be going home right after the procedure, plan to have a responsible adult: Take you home from the hospital or clinic. You will not be allowed to drive. Care for you for the time you are told. Tests You may have an exam or testing. This may include: Blood labs. A transesophageal echocardiogram (TEE). What happens during the procedure?     An IV will be inserted into one of your veins. You will be given a sedative. This helps you relax. Electrodes or metal paddles will be placed on your chest. They may be placed in one of these ways: One placed on your right chest, the other on the left ribs. One placed on your chest and the other on your back. An electrical shock will be delivered. The shock briefly stops (resets) your heart rhythm. Your provider will check to see if your heart rhythm is now normal. Some people need only one shock. Some need more to restore a normal heart rhythm. The procedure may vary among providers and hospitals. What happens after the procedure? Your blood pressure, heart rate, breathing rate, and blood oxygen  level will be monitored until you leave the hospital or clinic. Your heart rhythm will be watched to make sure it does not change. This information is not intended to replace advice  given to you by your health care provider. Make sure you discuss any questions you have with your health care provider. Document Revised: 10/11/2021 Document Reviewed: 10/11/2021 Elsevier Patient Education  2024 [employer].

## 2022-12-11 NOTE — CV Procedure (Signed)
Electrical Cardioversion Procedure Note Donald Wilkins [id number] 07-07-1950  Procedure: Electrical Cardioversion Indications:  Atrial Flutter  Procedure Details Consent: Risks of procedure as well as the alternatives and risks of each were explained to the (patient/caregiver).  Consent for procedure obtained. Time Out: Verified patient identification, verified procedure, site/side was marked, verified correct patient position, special equipment/implants available, medications/allergies/relevent history reviewed, required imaging and test results available.  Performed  Patient placed on cardiac monitor, pulse oximetry, supplemental oxygen as necessary.  Sedation given:  propofol Pacer pads placed anterior and posterior chest.  Cardioverted 1 time(s).  Cardioverted at 150J.  Evaluation Findings: Post procedure EKG shows: NSR Complications: None Patient did tolerate procedure well.   Chilton Si, MD 12/11/2022, 1:43 PM

## 2022-12-11 NOTE — Transfer of Care (Signed)
Immediate Anesthesia Transfer of Care Note  Patient: Donald Wilkins  Procedure(s) Performed: CARDIOVERSION  Patient Location: PACU and Cath Lab  Anesthesia Type:MAC  Level of Consciousness: awake, alert , and oriented  Airway & Oxygen Therapy: Patient Spontanous Breathing  Post-op Assessment: Report given to RN and Post -op Vital signs reviewed and stable  Post vital signs: Reviewed and stable  Last Vitals:  Vitals Value Taken Time  BP 105/64 12/11/22 1336  Temp 36.1 C 12/11/22 1336  Pulse 69 12/11/22 1337  Resp 15 12/11/22 1337  SpO2 97 % 12/11/22 1337  Vitals shown include unfiled device data.  Last Pain:  Vitals:   12/11/22 1336  TempSrc: Temporal  PainSc: 0-No pain         Complications: No notable events documented.

## 2022-12-11 NOTE — Anesthesia Postprocedure Evaluation (Signed)
Anesthesia Post Note  Patient: Donald Wilkins  Procedure(s) Performed: CARDIOVERSION     Patient location during evaluation: PACU Anesthesia Type: General Level of consciousness: awake Pain management: pain level controlled Vital Signs Assessment: post-procedure vital signs reviewed and stable Respiratory status: spontaneous breathing, nonlabored ventilation and respiratory function stable Cardiovascular status: blood pressure returned to baseline and stable Postop Assessment: no apparent nausea or vomiting Anesthetic complications: no   No notable events documented.  Last Vitals:  Vitals:   12/11/22 1355 12/11/22 1400  BP: 111/80 121/85  Pulse: (!) 56 64  Resp: 19 14  Temp:    SpO2: 97% 98%    Last Pain:  Vitals:   12/11/22 1400  TempSrc:   PainSc: 0-No pain                 Linton Rump

## 2022-12-11 NOTE — Interval H&P Note (Signed)
History and Physical Interval Note:  12/11/2022 1:16 PM  Donald Wilkins  has presented today for surgery, with the diagnosis of AFIB.  The various methods of treatment have been discussed with the patient and family. After consideration of risks, benefits and other options for treatment, the patient has consented to  Procedure(s): CARDIOVERSION (N/A) as a surgical intervention.  The patient's history has been reviewed, patient examined, no change in status, stable for surgery.  I have reviewed the patient's chart and labs.  Questions were answered to the patient's satisfaction.     Chilton Si,. MD

## 2022-12-12 ENCOUNTER — Encounter (HOSPITAL_COMMUNITY): Payer: Self-pay | Admitting: Cardiovascular Disease

## 2022-12-31 ENCOUNTER — Ambulatory Visit: Payer: Medicare HMO | Admitting: Physician Assistant

## 2023-01-08 DIAGNOSIS — D1801 Hemangioma of skin and subcutaneous tissue: Secondary | ICD-10-CM | POA: Diagnosis not present

## 2023-01-08 DIAGNOSIS — D2362 Other benign neoplasm of skin of left upper limb, including shoulder: Secondary | ICD-10-CM | POA: Diagnosis not present

## 2023-01-08 DIAGNOSIS — D225 Melanocytic nevi of trunk: Secondary | ICD-10-CM | POA: Diagnosis not present

## 2023-01-08 DIAGNOSIS — L821 Other seborrheic keratosis: Secondary | ICD-10-CM | POA: Diagnosis not present

## 2023-01-08 DIAGNOSIS — L570 Actinic keratosis: Secondary | ICD-10-CM | POA: Diagnosis not present

## 2023-01-08 DIAGNOSIS — L814 Other melanin hyperpigmentation: Secondary | ICD-10-CM | POA: Diagnosis not present

## 2023-01-08 DIAGNOSIS — D2262 Melanocytic nevi of left upper limb, including shoulder: Secondary | ICD-10-CM | POA: Diagnosis not present

## 2023-01-08 DIAGNOSIS — Z85828 Personal history of other malignant neoplasm of skin: Secondary | ICD-10-CM | POA: Diagnosis not present

## 2023-01-13 NOTE — Progress Notes (Unsigned)
Electrophysiology Office Note:   Date:  01/14/2023  ID:  Donald Wilkins, DOB 06-28-1950, MRN [id number]  Primary Cardiologist: Christell Constant, MD Electrophysiologist: Nobie Putnam, MD      History of Present Illness:   Donald Wilkins is a 72 y.o. male with h/o atrial flutter, coronary artery calcification, mild mitral regurgitation who is being seen today for evaluation of atrial flutter.  Patient had first diagnosis of atrial flutter in 2022.  He underwent successful cardioversion on 04/24/2020.  Patient contacted his cardiology office in September 2024 after his [name] mobile device was notifying him of atrial fibrillation.  During his office visit on 11/18/2022 he was found to be in atrial flutter.  He underwent successful cardioversion to normal sinus rhythm on 12/11/2022.  He states that he has done well since the cardioversion and has maintained normal rhythm.  He has not checked his rhythm with his Kardia mobile device, as he has not noticed any change in his symptoms.  He states that when he goes out of rhythm that he feels fatigued especially with exertion.  His Kardia mobile device has told him that he had probable A-fib in the past but he does not have the strips available for review today.  Review of systems complete and found to be negative unless listed in HPI.   EP Information / Studies Reviewed:    EKG is not ordered today. EKG from 12/09/22 reviewed which showed typical atrial flutter.        Echo 03/27/2020: Normal LV size and function.  LVEF 60 to 65%. Normal RV size and function. Moderately dilated left atrium. Mild mitral regurgitation.  Mild to moderate TR.  Coronary calcium scoring 04/17/2022: Coronary calcium score of 56.7.  This was 37 percentile for age and sex matched control.   Risk Assessment/Calculations:    CHA2DS2-VASc Score = 2   This indicates a 2.2% annual risk of stroke. The patient's score is based upon: CHF History: 0 HTN History: 0 Diabetes  History: 0 Stroke History: 0 Vascular Disease History: 1 Age Score: 1 Gender Score: 0             Physical Exam:   VS:  BP 118/64   Pulse 63  Ht 5\' 10"  (1.778 m)   Wt 199 lb (90.3 kg)   SpO2 98%   BMI 28.55 kg/m    Wt Readings from Last 3 Encounters:  01/14/23 199 lb (90.3 kg)  12/11/22 194 lb (88 kg)  12/09/22 194 lb (88 kg)     GEN: Well nourished, well developed in no acute distress NECK: No JVD; No carotid bruits CARDIAC: Normal rate, regular rhythm. RESPIRATORY:  Clear to auscultation without rales, wheezing or rhonchi  ABDOMEN: Soft, non-tender, non-distended EXTREMITIES:  No edema; No deformity   ASSESSMENT AND PLAN:   Donald Wilkins is a 72 y.o. male with h/o atrial flutter, coronary artery calcification, mild mitral regurgitation who is being seen today for evaluation of atrial flutter.  Patient's EKGs in the chart showed typical atrial flutter.  He does have a Kardia mobile device which she states has alerted him for probable atrial fibrillation in the past.  The strips are not available for review at this time.  His sinus rhythm EKGs have frequent PACs, so I suspect that there is a high likelihood of pulmonary vein A. tach and atrial fibrillation.  # Typical atrial flutter, symptomatic: # ?Paroxysmal atrial fibrillation: -Continue metoprolol for now.  Patient does not want to start any  antiarrhythmic medications as a bridge at this point.  If he has recurrence then we will start antiarrhythmic therapy until ablation can be performed. -I have asked the patient to send me his previous Kardia mobile strips which have identified atrial fibrillation. -We will place a 2-week live monitor today to assess for any short episodes of atrial fibrillation or atrial tachycardia which could be coming from the pulmonary veins. -Discussed treatment options today for AFL including antiarrhythmic drug therapy and ablation. Discussed risks, recovery and likelihood of success with each  treatment strategy. Risk, benefits, and alternatives to EP study and ablation for AFL were discussed. These risks include but are not limited to stroke, bleeding, vascular damage, tamponade, perforation, damage to the esophagus, lungs, phrenic nerve and other structures, pulmonary vein stenosis, worsening renal function, coronary vasospasm and death.  Discussed potential need for repeat ablation procedures and antiarrhythmic drugs after an initial ablation. The patient understands these risk and wishes to proceed.  At this point we will definitively plan for atypical flutter ablation plus/minus PVI based on results of ZIO monitor and/or you have his home Kardia tracings which he plans to send me.  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    Signed, Nobie Putnam, MD

## 2023-01-14 ENCOUNTER — Encounter: Payer: Self-pay | Admitting: Cardiology

## 2023-01-14 ENCOUNTER — Ambulatory Visit: Payer: Medicare HMO | Attending: Cardiology | Admitting: Cardiology

## 2023-01-14 ENCOUNTER — Ambulatory Visit (INDEPENDENT_AMBULATORY_CARE_PROVIDER_SITE_OTHER): Payer: Medicare HMO

## 2023-01-14 ENCOUNTER — Other Ambulatory Visit: Payer: Self-pay

## 2023-01-14 VITALS — BP 118/64 | HR 42 | Ht 70.0 in | Wt 199.0 lb

## 2023-01-14 DIAGNOSIS — I483 Typical atrial flutter: Secondary | ICD-10-CM | POA: Diagnosis not present

## 2023-01-14 DIAGNOSIS — D6869 Other thrombophilia: Secondary | ICD-10-CM | POA: Diagnosis not present

## 2023-01-14 DIAGNOSIS — I4892 Unspecified atrial flutter: Secondary | ICD-10-CM

## 2023-01-14 NOTE — Addendum Note (Signed)
Addended by: Frutoso Schatz on: 01/14/2023 10:28 AM   Modules accepted: Orders

## 2023-01-14 NOTE — Progress Notes (Unsigned)
Applied a 14 day Zio AT monitor to patient in the office 

## 2023-01-14 NOTE — Patient Instructions (Addendum)
Medication Instructions:  Your physician recommends that you continue on your current medications as directed. Please refer to the Current Medication list given to you today.  *If you need a refill on your cardiac medications before your next appointment, please call your pharmacy*  Lab Work: BMET and CBC   If you have labs (blood work) drawn today and your tests are completely normal, you will receive your results only by: MyChart Message (if you have MyChart) OR A paper copy in the mail If you have any lab test that is abnormal or we need to change your treatment, we will call you to review the results.   Testing/Procedures: Your physician has recommended that you wear an event monitor. Event monitors are medical devices that record the heart's electrical activity. Doctors most often [place] these monitors to diagnose arrhythmias. Arrhythmias are problems with the speed or rhythm of the heartbeat. The monitor is a small, portable device. You can wear one while you do your normal daily activities. This is usually used to diagnose what is causing palpitations/syncope (passing out).  Your physician has recommended that you have an ablation. Catheter ablation is a medical procedure used to treat some cardiac arrhythmias (irregular heartbeats). During catheter ablation, a long, thin, flexible tube is put into a blood vessel in your groin (upper thigh), or neck. This tube is called an ablation catheter. It is then guided to your heart through the blood vessel. Radio frequency waves destroy small areas of heart tissue where abnormal heartbeats may cause an arrhythmia to start. Please see the instruction sheet given to you today.  Follow-Up: At [facility], you and your health needs are our priority.  As part of our continuing mission to provide you with exceptional heart care, we have created designated Provider Care Teams.  These Care Teams include your primary Cardiologist (physician) and  Advanced Practice Providers (APPs -  Physician Assistants and Nurse Practitioners) who all work together to provide you with the care you need, when you need it.  Your next appointment:   We will call you to schedule your follow up appointments

## 2023-01-15 DIAGNOSIS — I483 Typical atrial flutter: Secondary | ICD-10-CM | POA: Diagnosis not present

## 2023-01-15 LAB — CBC
Hematocrit: 40.7 % (ref 37.5–51.0)
Hemoglobin: 13.3 g/dL (ref 13.0–17.7)
MCH: 32.2 pg (ref 26.6–33.0)
MCHC: 32.7 g/dL (ref 31.5–35.7)
MCV: 99 fL — ABNORMAL HIGH (ref 79–97)
Platelets: 196 10*3/uL (ref 150–450)
RBC: 4.13 x10E6/uL — ABNORMAL LOW (ref 4.14–5.80)
RDW: 11.6 % (ref 11.6–15.4)
WBC: 6 10*3/uL (ref 3.4–10.8)

## 2023-01-15 LAB — BASIC METABOLIC PANEL
BUN/Creatinine Ratio: 16 (ref 10–24)
BUN: 15 mg/dL (ref 8–27)
CO2: 27 mmol/L (ref 20–29)
Calcium: 9.4 mg/dL (ref 8.6–10.2)
Chloride: 105 mmol/L (ref 96–106)
Creatinine, Ser: 0.92 mg/dL (ref 0.76–1.27)
Glucose: 85 mg/dL (ref 70–99)
Potassium: 4.7 mmol/L (ref 3.5–5.2)
Sodium: 141 mmol/L (ref 134–144)
eGFR: 89 mL/min/{1.73_m2} (ref >59)

## 2023-01-23 ENCOUNTER — Institutional Professional Consult (permissible substitution) (INDEPENDENT_AMBULATORY_CARE_PROVIDER_SITE_OTHER): Payer: Medicare HMO | Admitting: Otolaryngology

## 2023-01-31 ENCOUNTER — Other Ambulatory Visit: Payer: Self-pay | Admitting: Internal Medicine

## 2023-02-12 ENCOUNTER — Other Ambulatory Visit: Payer: Self-pay | Admitting: Internal Medicine

## 2023-02-14 ENCOUNTER — Ambulatory Visit (HOSPITAL_COMMUNITY)
Admission: RE | Admit: 2023-02-14 | Discharge: 2023-02-14 | Disposition: A | Discharge status: Home / Self Care | Payer: Medicare HMO | Attending: Cardiology | Admitting: Cardiology

## 2023-02-14 ENCOUNTER — Ambulatory Visit (HOSPITAL_COMMUNITY): Payer: Medicare HMO | Admitting: Anesthesiology

## 2023-02-14 ENCOUNTER — Encounter (HOSPITAL_COMMUNITY)
Admission: RE | Disposition: A | Discharge status: Home / Self Care | Payer: Medicare HMO | Source: Home / Self Care | Attending: Cardiology

## 2023-02-14 ENCOUNTER — Ambulatory Visit (HOSPITAL_BASED_OUTPATIENT_CLINIC_OR_DEPARTMENT_OTHER): Payer: Medicare HMO | Admitting: Anesthesiology

## 2023-02-14 ENCOUNTER — Other Ambulatory Visit: Payer: Self-pay

## 2023-02-14 DIAGNOSIS — D6869 Other thrombophilia: Secondary | ICD-10-CM | POA: Diagnosis not present

## 2023-02-14 DIAGNOSIS — I484 Atypical atrial flutter: Secondary | ICD-10-CM

## 2023-02-14 DIAGNOSIS — I341 Nonrheumatic mitral (valve) prolapse: Secondary | ICD-10-CM | POA: Insufficient documentation

## 2023-02-14 DIAGNOSIS — Z87891 Personal history of nicotine dependence: Secondary | ICD-10-CM | POA: Insufficient documentation

## 2023-02-14 DIAGNOSIS — I4892 Unspecified atrial flutter: Secondary | ICD-10-CM | POA: Diagnosis not present

## 2023-02-14 DIAGNOSIS — Z7901 Long term (current) use of anticoagulants: Secondary | ICD-10-CM | POA: Diagnosis not present

## 2023-02-14 DIAGNOSIS — I4891 Unspecified atrial fibrillation: Secondary | ICD-10-CM | POA: Insufficient documentation

## 2023-02-14 DIAGNOSIS — I483 Typical atrial flutter: Secondary | ICD-10-CM | POA: Diagnosis not present

## 2023-02-14 DIAGNOSIS — I2510 Atherosclerotic heart disease of native coronary artery without angina pectoris: Secondary | ICD-10-CM | POA: Diagnosis not present

## 2023-02-14 HISTORY — PX: A-FLUTTER ABLATION: EP1230

## 2023-02-14 LAB — CBC
HCT: 40.8 % (ref 39.0–52.0)
Hemoglobin: 13.8 g/dL (ref 13.0–17.0)
MCH: 32.5 pg (ref 26.0–34.0)
MCHC: 33.8 g/dL (ref 30.0–36.0)
MCV: 96 fL (ref 80.0–100.0)
Platelets: 212 10*3/uL (ref 150–400)
RBC: 4.25 MIL/uL (ref 4.22–5.81)
RDW: 12.1 % (ref 11.5–15.5)
WBC: 5.6 10*3/uL (ref 4.0–10.5)
nRBC: 0 % (ref 0.0–0.2)

## 2023-02-14 LAB — BASIC METABOLIC PANEL
Anion gap: 7 (ref 5–15)
BUN: 15 mg/dL (ref 8–23)
CO2: 23 mmol/L (ref 22–32)
Calcium: 8.7 mg/dL — ABNORMAL LOW (ref 8.9–10.3)
Chloride: 106 mmol/L (ref 98–111)
Creatinine, Ser: 0.9 mg/dL (ref 0.61–1.24)
GFR, Estimated: >60 mL/min (ref >60)
Glucose, Bld: 103 mg/dL — ABNORMAL HIGH (ref 70–99)
Potassium: 4.2 mmol/L (ref 3.5–5.1)
Sodium: 136 mmol/L (ref 135–145)

## 2023-02-14 SURGERY — A-FLUTTER ABLATION
Anesthesia: General

## 2023-02-14 MED ORDER — METOPROLOL TARTRATE 12.5 MG HALF TABLET
12.5000 mg | ORAL_TABLET | Freq: Once | ORAL | Status: AC
  Administered 2023-02-14: 12.5 mg via ORAL
  Filled 2023-02-14: qty 1

## 2023-02-14 MED ORDER — SODIUM CHLORIDE 0.9 % IV SOLN
INTRAVENOUS | Status: DC
Start: 2023-02-14 — End: 2023-02-14

## 2023-02-14 MED ORDER — ONDANSETRON HCL 4 MG/2ML IJ SOLN
INTRAMUSCULAR | Status: DC | PRN
  Administered 2023-02-14: 4 mg via INTRAVENOUS

## 2023-02-14 MED ORDER — SUGAMMADEX SODIUM 200 MG/2ML IV SOLN
INTRAVENOUS | Status: DC | PRN
  Administered 2023-02-14: 177.8 mg via INTRAVENOUS

## 2023-02-14 MED ORDER — HEPARIN SODIUM (PORCINE) 1000 UNIT/ML IJ SOLN
INTRAMUSCULAR | Status: DC | PRN
  Administered 2023-02-14: 1000 [IU] via INTRAVENOUS

## 2023-02-14 MED ORDER — PHENYLEPHRINE 80 MCG/ML (10ML) SYRINGE FOR IV PUSH (FOR BLOOD PRESSURE SUPPORT)
PREFILLED_SYRINGE | INTRAVENOUS | Status: DC | PRN
  Administered 2023-02-14 (×2): 80 ug via INTRAVENOUS
  Administered 2023-02-14: 160 ug via INTRAVENOUS
  Administered 2023-02-14: 40 ug via INTRAVENOUS

## 2023-02-14 MED ORDER — APIXABAN 5 MG PO TABS
5.0000 mg | ORAL_TABLET | Freq: Once | ORAL | Status: AC
Start: 2023-02-14 — End: 2023-02-14
  Administered 2023-02-14: 5 mg via ORAL
  Filled 2023-02-14: qty 1

## 2023-02-14 MED ORDER — MULTAQ 400 MG PO TABS: 400.0000 mg | ORAL_TABLET | Freq: Two times a day (BID) | ORAL | 2 refills | Status: DC

## 2023-02-14 MED ORDER — LIDOCAINE 2% (20 MG/ML) 5 ML SYRINGE
INTRAMUSCULAR | Status: DC | PRN
  Administered 2023-02-14: 40 mg via INTRAVENOUS

## 2023-02-14 MED ORDER — FENTANYL CITRATE (PF) 250 MCG/5ML IJ SOLN
INTRAMUSCULAR | Status: DC | PRN
  Administered 2023-02-14 (×2): 50 ug via INTRAVENOUS

## 2023-02-14 MED ORDER — ONDANSETRON HCL 4 MG/2ML IJ SOLN: 4.0000 mg | Freq: Four times a day (QID) | INTRAMUSCULAR | Status: DC | PRN

## 2023-02-14 MED ORDER — HEPARIN (PORCINE) IN NACL 1000-0.9 UT/500ML-% IV SOLN
INTRAVENOUS | Status: DC | PRN
  Administered 2023-02-14 (×2): 500 mL

## 2023-02-14 MED ORDER — ROCURONIUM BROMIDE 10 MG/ML (PF) SYRINGE
PREFILLED_SYRINGE | INTRAVENOUS | Status: DC | PRN
  Administered 2023-02-14 (×2): 20 mg via INTRAVENOUS
  Administered 2023-02-14: 30 mg via INTRAVENOUS
  Administered 2023-02-14: 10 mg via INTRAVENOUS
  Administered 2023-02-14: 20 mg via INTRAVENOUS

## 2023-02-14 MED ORDER — HEPARIN SODIUM (PORCINE) 1000 UNIT/ML IJ SOLN
INTRAMUSCULAR | Status: AC
  Filled 2023-02-14: qty 10

## 2023-02-14 MED ORDER — PROPOFOL 10 MG/ML IV BOLUS
INTRAVENOUS | Status: DC | PRN
  Administered 2023-02-14 (×3): 50 mg via INTRAVENOUS

## 2023-02-14 MED ORDER — ACETAMINOPHEN 325 MG PO TABS
650.0000 mg | ORAL_TABLET | ORAL | Status: DC | PRN
Start: 2023-02-14 — End: 2023-02-14

## 2023-02-14 MED ORDER — DRONEDARONE HCL 400 MG PO TABS
400.0000 mg | ORAL_TABLET | Freq: Once | ORAL | Status: AC
  Administered 2023-02-14: 400 mg via ORAL
  Filled 2023-02-14: qty 1

## 2023-02-14 MED ORDER — PHENYLEPHRINE HCL-NACL 20-0.9 MG/250ML-% IV SOLN
INTRAVENOUS | Status: AC
  Filled 2023-02-14: qty 750

## 2023-02-14 SURGICAL SUPPLY — 15 items
BAG SNAP BAND KOVER 36X36 (MISCELLANEOUS) IMPLANT
CATH 8FR REPROCESSED SOUNDSTAR (CATHETERS) ×1 IMPLANT
CATH 8FR SOUNDSTAR REPROCESSED (CATHETERS) IMPLANT
CATH SMTCH THERMOCOOL SF DF (CATHETERS) IMPLANT
CATH WEB BI DIR CSDF CRV REPRO (CATHETERS) IMPLANT
CLOSURE PERCLOSE PROSTYLE (VASCULAR PRODUCTS) IMPLANT
DEVICE CLOSURE MYNXGRIP 6/7F (Vascular Products) IMPLANT
PACK EP LF (CUSTOM PROCEDURE TRAY) ×2 IMPLANT
PAD DEFIB RADIO PHYSIO CONN (PAD) ×2 IMPLANT
PATCH CARTO3 (PAD) IMPLANT
SHEATH CARTO VIZIGO MED CURVE (SHEATH) IMPLANT
SHEATH PINNACLE 8F 10CM (SHEATH) IMPLANT
SHEATH PINNACLE 9F 10CM (SHEATH) IMPLANT
SHEATH PROBE COVER 6X72 (BAG) IMPLANT
TUBING SMART ABLATE COOLFLOW (TUBING) IMPLANT

## 2023-02-14 NOTE — Anesthesia Postprocedure Evaluation (Signed)
Anesthesia Post Note  Patient: Donald Wilkins  Procedure(s) Performed: A-FLUTTER ABLATION     Patient location during evaluation: PACU Anesthesia Type: General Level of consciousness: awake Pain management: pain level controlled Vital Signs Assessment: post-procedure vital signs reviewed and stable Respiratory status: spontaneous breathing, nonlabored ventilation and respiratory function stable Cardiovascular status: blood pressure returned to baseline and stable Postop Assessment: no apparent nausea or vomiting Anesthetic complications: no   There were no known notable events for this encounter.  Last Vitals:  Vitals:   02/14/23 1300 02/14/23 1400  BP: 121/84 131/87  Pulse: (!) 109 (!) 117  Resp: 11   Temp:    SpO2: 95% 96%    Last Pain:  Vitals:   02/14/23 1108  TempSrc:   PainSc: 0-No pain                 Dollye Glasser P Ivorie Uplinger

## 2023-02-14 NOTE — Anesthesia Preprocedure Evaluation (Signed)
Anesthesia Evaluation  Patient identified by MRN, date of birth, ID band Patient awake    Reviewed: Allergy & Precautions, NPO status , Patient's Chart, lab work & pertinent test results  Airway Mallampati: II  TM Distance: >3 FB Neck ROM: Full    Dental  (+) Partial Lower, Missing   Pulmonary former smoker   Pulmonary exam normal        Cardiovascular Normal cardiovascular exam+ dysrhythmias Atrial Fibrillation + Valvular Problems/Murmurs MVP      Neuro/Psych  PSYCHIATRIC DISORDERS  Depression    negative neurological ROS     GI/Hepatic negative GI ROS, Neg liver ROS,,,  Endo/Other  negative endocrine ROS    Renal/GU negative Renal ROS     Musculoskeletal negative musculoskeletal ROS (+)    Abdominal   Peds  Hematology  (+) Blood dyscrasia (Eliquis)   Anesthesia Other Findings A-flutter  Reproductive/Obstetrics                             Anesthesia Physical Anesthesia Plan  ASA: 3  Anesthesia Plan: General   Post-op Pain Management:    Induction: Intravenous  PONV Risk Score and Plan: 2 and Ondansetron, Dexamethasone and Treatment may vary due to age or medical condition  Airway Management Planned: Oral ETT  Additional Equipment:   Intra-op Plan:   Post-operative Plan: Extubation in OR  Informed Consent: I have reviewed the patients History and Physical, chart, labs and discussed the procedure including the risks, benefits and alternatives for the proposed anesthesia with the patient or authorized representative who has indicated his/her understanding and acceptance.     Dental advisory given  Plan Discussed with: CRNA  Anesthesia Plan Comments:        Anesthesia Quick Evaluation

## 2023-02-14 NOTE — Anesthesia Procedure Notes (Signed)
Procedure Name: Intubation Date/Time: 02/14/2023 7:49 AM  Performed by: Hessie Diener, CRNAPre-anesthesia Checklist: Patient identified, Emergency Drugs available, Suction available and Patient being monitored Patient Re-evaluated:Patient Re-evaluated prior to induction Oxygen Delivery Method: Circle System Utilized Preoxygenation: Pre-oxygenation with 100% oxygen Induction Type: IV induction Ventilation: Mask ventilation without difficulty Laryngoscope Size: Mac and 3 Grade View: Grade III Tube type: Oral Tube size: 7.5 mm Number of attempts: 1 Airway Equipment and Method: Stylet and Oral airway Placement Confirmation: ETT inserted through vocal cords under direct vision, positive ETCO2 and breath sounds checked- equal and bilateral Tube secured with: Tape Dental Injury: Teeth and Oropharynx as per pre-operative assessment

## 2023-02-14 NOTE — Progress Notes (Signed)
Pt ambulated to and from bathroom to void with no signs of oozing from bilateral groin sites  

## 2023-02-14 NOTE — Transfer of Care (Signed)
Immediate Anesthesia Transfer of Care Note  Patient: Donald Wilkins  Procedure(s) Performed: A-FLUTTER ABLATION  Patient Location: PACU  Anesthesia Type:General  Level of Consciousness: awake  Airway & Oxygen Therapy: Patient connected to face mask oxygen  Post-op Assessment: Post -op Vital signs reviewed and stable  Post vital signs: stable  Last Vitals:  Vitals Value Taken Time  BP 119/73 02/14/23 1025  Temp    Pulse 60 02/14/23 1029  Resp 10 02/14/23 1029  SpO2 94 % 02/14/23 1029  Vitals shown include unfiled device data.  Last Pain:  Vitals:   02/14/23 0648  TempSrc: Oral  PainSc:          Complications: There were no known notable events for this encounter.

## 2023-02-14 NOTE — Discharge Instructions (Signed)

## 2023-02-14 NOTE — H&P (Signed)
Electrophysiology Note:   Date:  02/14/2023  ID:  Donald Wilkins, DOB 1950-03-21, MRN [id number]   Primary Cardiologist: Christell Constant, MD Electrophysiologist: Nobie Putnam, MD       History of Present Illness:   Donald Wilkins is a 72 y.o. male with h/o atrial flutter, coronary artery calcification, mild mitral regurgitation who is being seen today for evaluation of atrial flutter.   Patient had first diagnosis of atrial flutter in 2022.  He underwent successful cardioversion on 04/24/2020.  Patient contacted his cardiology office in September 2024 after his Donald Wilkins mobile device was notifying him of atrial fibrillation.  During his office visit on 11/18/2022 he was found to be in atrial flutter.  He underwent successful cardioversion to normal sinus rhythm on 12/11/2022.  He states that he has done well since the cardioversion and has maintained normal rhythm.  He has not checked his rhythm with his Kardia mobile device, as he has not noticed any change in his symptoms.  He states that when he goes out of rhythm that he feels fatigued especially with exertion.  His Kardia mobile device has told him that he had probable A-fib in the past but he does not have the strips available for review today.   Interval: Reports doing well. Presents today for ablation. Wore Zio monitor which did not show any definitive or sustained episodes of Afib. He denies any symptoms while wearing his monitor.   Review of systems complete and found to be negative unless listed in HPI.    EP Information / Studies Reviewed:     EKG is not ordered today. EKG from 12/09/22 reviewed which showed typical atrial flutter.          Echo 03/27/2020: Normal LV size and function.  LVEF 60 to 65%. Normal RV size and function. Moderately dilated left atrium. Mild mitral regurgitation.  Mild to moderate TR.   Coronary calcium scoring 04/17/2022: Coronary calcium score of 56.7.  This was 37 percentile for age and sex matched  control.     Risk Assessment/Calculations:     CHA2DS2-VASc Score = 2   This indicates a 2.2% annual risk of stroke. The patient's score is based upon: CHF History: 0 HTN History: 0 Diabetes History: 0 Stroke History: 0 Vascular Disease History: 1 Age Score: 1 Gender Score: 0               Physical Exam:    Today's Vitals   02/14/23 0605 02/14/23 0648  BP:  122/77  Pulse:  (!) 44  Resp:  14  Temp:  98.1 F (36.7 C)  TempSrc:  Oral  SpO2:  92%  Weight:  88.9 kg  Height:  5\' 10"  (1.778 m)  PainSc: 0-No pain    Body mass index is 28.12 kg/m.    GEN: Well nourished, well developed in no acute distress NECK: No JVD; No carotid bruits CARDIAC: Normal rate, regular rhythm. RESPIRATORY:  Clear to auscultation without rales, wheezing or rhonchi  ABDOMEN: Soft, non-tender, non-distended EXTREMITIES:  No edema; No deformity    ASSESSMENT AND PLAN:   Donald Wilkins is a 72 y.o. male with h/o atrial flutter, coronary artery calcification, mild mitral regurgitation who is being seen today for evaluation of atrial flutter.   Patient's EKGs in the chart showed typical atrial flutter.  He does have a Kardia mobile device which she states has alerted him for probable atrial fibrillation in the past. Based on his Zio, I think  these are PACs. The strips are not available for review at this time.  On his Zio monitor, there were no definitive or sustained episodes of AF, although very frequent PACs.   # Typical atrial flutter, symptomatic: -Continue metoprolol. -Discussed treatment options today for AFL including antiarrhythmic drug therapy and ablation. Discussed risks, recovery and likelihood of success with each treatment strategy. Risk, benefits, and alternatives to EP study and ablation for AFL were discussed. These risks include but are not limited to stroke, bleeding, vascular damage, tamponade, perforation, damage to the esophagus, lungs, phrenic nerve and other structures,  pulmonary vein stenosis, worsening renal function, coronary vasospasm and death.  Discussed potential need for repeat ablation procedures and antiarrhythmic drugs after an initial ablation. The patient understands these risk and wishes to proceed with AFL ablation today.    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

## 2023-02-17 ENCOUNTER — Encounter (HOSPITAL_COMMUNITY): Payer: Self-pay | Admitting: Cardiology

## 2023-03-14 ENCOUNTER — Ambulatory Visit: Payer: Medicare HMO | Admitting: Cardiology

## 2023-03-14 ENCOUNTER — Ambulatory Visit: Payer: Medicare HMO | Admitting: Physician Assistant

## 2023-03-30 NOTE — Progress Notes (Unsigned)
Subjective:    Patient ID: [name], male    DOB: Mar 10, 1950, 73 y.o.   MRN: [id number]     HPI Rowe is here for a physical exam and his chronic medical problems.  Had ablation in December - feels he has been in rhythm since then.    Overall doing well-no concerns  Medications and allergies reviewed with patient and updated if appropriate.  Current Outpatient Medications on File Prior to Visit  Medication Sig Dispense Refill   apixaban (ELIQUIS) 5 MG TABS tablet Take 1 tablet (5 mg total) by mouth 2 (two) times daily. 60 tablet 5   Ascorbic Acid (VITAMIN C) 1000 MG tablet Take 1,000 mg by mouth daily.     Cholecalciferol (VITAMIN D3) 50 MCG (2000 UT) capsule Take 2,000 Units by mouth daily.     Cyanocobalamin (VITAMIN B-12) 2500 MCG SUBL Take 2,500 mcg by mouth daily.     finasteride (PROSCAR) 5 MG tablet TAKE 1/2 TABLET BY MOUTH EVERY 2 DAYS 90 tablet 3   FLUoxetine (PROZAC) 10 MG capsule TAKE 1 CAPSULE BY MOUTH EVERY DAY 90 capsule 2   metoprolol tartrate (LOPRESSOR) 25 MG tablet TAKE 0.5 TABLETS BY MOUTH 2 TIMES DAILY. 90 tablet 1   Omega-3 Fatty Acids (SALMON OIL-1000 PO) Take 1,000 mg by mouth daily.     rosuvastatin (CRESTOR) 5 MG tablet Take 1 tablet (5 mg total) by mouth daily. 90 tablet 3   sildenafil (REVATIO) 20 MG tablet Take 2-5 pills as needed 40 tablet 5   Turmeric (QC TUMERIC COMPLEX PO) Take 1,000 mg by mouth daily.     zinc gluconate 50 MG tablet Take 50 mg by mouth daily.     dronedarone (MULTAQ) 400 MG tablet Take 1 tablet (400 mg total) by mouth 2 (two) times daily with a meal. (Patient not taking: Reported on 03/31/2023) 60 tablet 2   No current facility-administered medications on file prior to visit.    Review of Systems  Constitutional:  Negative for fever.  Eyes:  Negative for visual disturbance.  Respiratory:  Negative for cough, shortness of breath and wheezing.   Cardiovascular:  Negative for chest pain, palpitations and leg swelling.   Gastrointestinal:  Negative for abdominal pain, blood in stool, constipation and diarrhea.       Occ gerd  Genitourinary:  Negative for difficulty urinating, dysuria and hematuria.  Musculoskeletal:  Positive for back pain (lower back - no radiation). Negative for arthralgias.  Skin:  Negative for rash.  Neurological:  Negative for light-headedness and headaches.  Psychiatric/Behavioral:  Negative for dysphoric mood. The patient is not nervous/anxious.        Objective:   Vitals:   03/31/23 0937  BP: 120/74  Pulse: (!) 50  Temp: 98.3 F (36.8 C)  SpO2: 97%   Filed Weights   03/31/23 0937  Weight: 198 lb (89.8 kg)   Body mass index is 28.41 kg/m.  BP Readings from Last 3 Encounters:  03/31/23 120/74  02/14/23 131/87  01/14/23 118/64    Wt Readings from Last 3 Encounters:  03/31/23 198 lb (89.8 kg)  02/14/23 196 lb (88.9 kg)  01/14/23 199 lb (90.3 kg)      Physical Exam Constitutional: He appears well-developed and well-nourished. No distress.  HENT:  Head: Normocephalic and atraumatic.  Right Ear: External ear normal.  Left Ear: External ear normal.  Normal ear canals and TM b/l  Mouth/Throat: Oropharynx is clear and moist. Eyes: Conjunctivae and EOM are  normal.  Neck: Neck supple. No tracheal deviation present. No thyromegaly present.  No carotid bruit  Cardiovascular: Normal rate, regular rhythm, normal heart sounds and intact distal pulses.   No murmur heard.  No lower extremity edema. Pulmonary/Chest: Effort normal and breath sounds normal. No respiratory distress. He has no wheezes. He has no rales.  Abdominal: Soft. He exhibits no distension. There is no tenderness.  Genitourinary: deferred  Lymphadenopathy:   He has no cervical adenopathy.  Skin: Skin is warm and dry. He is not diaphoretic.  Psychiatric: He has a normal mood and affect. His behavior is normal.         Assessment & Plan:   Physical exam: Screening blood work  ordered Exercise    walking 4-5 days a week Weight  is good Substance abuse   none   Reviewed recommended immunizations.   Health Maintenance  Topic Date Due   Medicare Annual Wellness (AWV)  02/08/2023   Colonoscopy  08/02/2024   DTaP/Tdap/Td (3 - Td or Tdap) 12/01/2028   Pneumonia Vaccine 45+ Years old  Completed   INFLUENZA VACCINE  Completed   COVID-19 Vaccine  Completed   Hepatitis C Screening  Completed   Zoster Vaccines- Shingrix  Completed   HPV VACCINES  Aged Out     See Problem List for Assessment and Plan of chronic medical problems.

## 2023-03-30 NOTE — Patient Instructions (Addendum)
Blood work was ordered.       Medications changes include :   cialis     [email or web address]    Return in about 1 year (around 03/30/2024) for Physical Exam.    Health Maintenance, Male Adopting a healthy lifestyle and getting preventive care are important in promoting health and wellness. Ask your health care provider about: The right schedule for you to have regular tests and exams. Things you can do on your own to prevent diseases and keep yourself healthy. What should I know about diet, weight, and exercise? Eat a healthy diet  Eat a diet that includes plenty of vegetables, fruits, low-fat dairy products, and lean protein. Do not eat a lot of foods that are high in solid fats, added sugars, or sodium. Maintain a healthy weight Body mass index (BMI) is a measurement that can be used to identify possible weight problems. It estimates body fat based on height and weight. Your health care provider can help determine your BMI and help you achieve or maintain a healthy weight. Get regular exercise Get regular exercise. This is one of the most important things you can do for your health. Most adults should: Exercise for at least 150 minutes each week. The exercise should increase your heart rate and make you sweat (moderate-intensity exercise). Do strengthening exercises at least twice a week. This is in addition to the moderate-intensity exercise. Spend less time sitting. Even light physical activity can be beneficial. Watch cholesterol and blood lipids Have your blood tested for lipids and cholesterol at 71 years of age, then have this test every 5 years. You may need to have your cholesterol levels checked more often if: Your lipid or cholesterol levels are high. You are older than 71 years of age. You are at high risk for heart disease. What should I know about cancer screening? Many types of cancers can be detected early and may often be prevented. Depending on  your health history and family history, you may need to have cancer screening at various ages. This may include screening for: Colorectal cancer. Prostate cancer. Skin cancer. Lung cancer. What should I know about heart disease, diabetes, and high blood pressure? Blood pressure and heart disease High blood pressure causes heart disease and increases the risk of stroke. This is more likely to develop in people who have high blood pressure readings or are overweight. Talk with your health care provider about your target blood pressure readings. Have your blood pressure checked: Every 3-5 years if you are 82-39 years of age. Every year if you are 77 years old or older. If you are between the ages of 72 and 20 and are a current or former smoker, ask your health care provider if you should have a one-time screening for abdominal aortic aneurysm (AAA). Diabetes Have regular diabetes screenings. This checks your fasting blood sugar level. Have the screening done: Once every three years after age 69 if you are at a normal weight and have a low risk for diabetes. More often and at a younger age if you are overweight or have a high risk for diabetes. What should I know about preventing infection? Hepatitis B If you have a higher risk for hepatitis B, you should be screened for this virus. Talk with your health care provider to find out if you are at risk for hepatitis B infection. Hepatitis C Blood testing is recommended for: Everyone born from [birth year] through 1963. Anyone with known risk factors  for hepatitis C. Sexually transmitted infections (STIs) You should be screened each year for STIs, including gonorrhea and chlamydia, if: You are sexually active and are younger than 71 years of age. You are older than 71 years of age and your health care provider tells you that you are at risk for this type of infection. Your sexual activity has changed since you were last screened, and you are at increased  risk for chlamydia or gonorrhea. Ask your health care provider if you are at risk. Ask your health care provider about whether you are at high risk for HIV. Your health care provider may recommend a prescription medicine to help prevent HIV infection. If you choose to take medicine to prevent HIV, you should first get tested for HIV. You should then be tested every 3 months for as long as you are taking the medicine. Follow these instructions at home: Alcohol use Do not drink alcohol if your health care provider tells you not to drink. If you drink alcohol: Limit how much you have to 0-2 drinks a day. Know how much alcohol is in your drink. In the [place] one drink equals one 12 oz bottle of beer (355 mL), one 5 oz glass of wine (148 mL), or one 1 oz glass of hard liquor (44 mL). Lifestyle Do not use any products that contain nicotine or tobacco. These products include cigarettes, chewing tobacco, and vaping devices, such as e-cigarettes. If you need help quitting, ask your health care provider. Do not use street drugs. Do not share needles. Ask your health care provider for help if you need support or information about quitting drugs. General instructions Schedule regular health, dental, and eye exams. Stay current with your vaccines. Tell your health care provider if: You often feel depressed. You have ever been abused or do not feel safe at home. Summary Adopting a healthy lifestyle and getting preventive care are important in promoting health and wellness. Follow your health care provider's instructions about healthy diet, exercising, and getting tested or screened for diseases. Follow your health care provider's instructions on monitoring your cholesterol and blood pressure. This information is not intended to replace advice given to you by your health care provider. Make sure you discuss any questions you have with your health care provider. Document Revised: 07/10/2020 Document  Reviewed: 07/10/2020 Elsevier Patient Education  2024 [employer].

## 2023-03-31 ENCOUNTER — Ambulatory Visit (INDEPENDENT_AMBULATORY_CARE_PROVIDER_SITE_OTHER): Payer: Medicare HMO | Admitting: Internal Medicine

## 2023-03-31 ENCOUNTER — Encounter: Payer: Self-pay | Admitting: Internal Medicine

## 2023-03-31 ENCOUNTER — Telehealth: Payer: Self-pay

## 2023-03-31 ENCOUNTER — Other Ambulatory Visit (HOSPITAL_COMMUNITY): Payer: Self-pay

## 2023-03-31 VITALS — BP 120/74 | HR 50 | Temp 98.3°F | Ht 70.0 in | Wt 198.0 lb

## 2023-03-31 DIAGNOSIS — N529 Male erectile dysfunction, unspecified: Secondary | ICD-10-CM

## 2023-03-31 DIAGNOSIS — Z0000 Encounter for general adult medical examination without abnormal findings: Secondary | ICD-10-CM

## 2023-03-31 DIAGNOSIS — E782 Mixed hyperlipidemia: Secondary | ICD-10-CM

## 2023-03-31 DIAGNOSIS — R7303 Prediabetes: Secondary | ICD-10-CM | POA: Diagnosis not present

## 2023-03-31 DIAGNOSIS — F3289 Other specified depressive episodes: Secondary | ICD-10-CM

## 2023-03-31 DIAGNOSIS — Z125 Encounter for screening for malignant neoplasm of prostate: Secondary | ICD-10-CM

## 2023-03-31 DIAGNOSIS — I4892 Unspecified atrial flutter: Secondary | ICD-10-CM

## 2023-03-31 DIAGNOSIS — I2510 Atherosclerotic heart disease of native coronary artery without angina pectoris: Secondary | ICD-10-CM

## 2023-03-31 LAB — COMPREHENSIVE METABOLIC PANEL
ALT: 18 U/L (ref 0–53)
AST: 21 U/L (ref 0–37)
Albumin: 4.3 g/dL (ref 3.5–5.2)
Alkaline Phosphatase: 61 U/L (ref 39–117)
BUN: 18 mg/dL (ref 6–23)
CO2: 28 meq/L (ref 19–32)
Calcium: 9.6 mg/dL (ref 8.4–10.5)
Chloride: 103 meq/L (ref 96–112)
Creatinine, Ser: 0.92 mg/dL (ref 0.40–1.50)
GFR: 84.04 mL/min (ref >60.00)
Glucose, Bld: 102 mg/dL — ABNORMAL HIGH (ref 70–99)
Potassium: 4.9 meq/L (ref 3.5–5.1)
Sodium: 137 meq/L (ref 135–145)
Total Bilirubin: 0.9 mg/dL (ref 0.2–1.2)
Total Protein: 6.4 g/dL (ref 6.0–8.3)

## 2023-03-31 LAB — PSA, MEDICARE: PSA: 0.79 ng/mL (ref 0.10–4.00)

## 2023-03-31 LAB — CBC WITH DIFFERENTIAL/PLATELET
Basophils Absolute: 0 10*3/uL (ref 0.0–0.1)
Basophils Relative: 0.6 % (ref 0.0–3.0)
Eosinophils Absolute: 0.1 10*3/uL (ref 0.0–0.7)
Eosinophils Relative: 2.3 % (ref 0.0–5.0)
HCT: 42.7 % (ref 39.0–52.0)
Hemoglobin: 14.1 g/dL (ref 13.0–17.0)
Lymphocytes Relative: 20.3 % (ref 12.0–46.0)
Lymphs Abs: 1.3 10*3/uL (ref 0.7–4.0)
MCHC: 33.1 g/dL (ref 30.0–36.0)
MCV: 97 fL (ref 78.0–100.0)
Monocytes Absolute: 0.7 10*3/uL (ref 0.1–1.0)
Monocytes Relative: 10.7 % (ref 3.0–12.0)
Neutro Abs: 4.1 10*3/uL (ref 1.4–7.7)
Neutrophils Relative %: 66.1 % (ref 43.0–77.0)
Platelets: 237 10*3/uL (ref 150.0–400.0)
RBC: 4.41 Mil/uL (ref 4.22–5.81)
RDW: 13.1 % (ref 11.5–15.5)
WBC: 6.2 10*3/uL (ref 4.0–10.5)

## 2023-03-31 LAB — LIPID PANEL
Cholesterol: 157 mg/dL (ref 0–200)
HDL: 58.9 mg/dL (ref >39.00)
LDL Cholesterol: 81 mg/dL (ref 0–99)
NonHDL: 98.47
Total CHOL/HDL Ratio: 3
Triglycerides: 88 mg/dL (ref 0.0–149.0)
VLDL: 17.6 mg/dL (ref 0.0–40.0)

## 2023-03-31 LAB — HEMOGLOBIN A1C: Hgb A1c MFr Bld: 5.8 % (ref 4.6–6.5)

## 2023-03-31 LAB — TSH: TSH: 1.36 u[IU]/mL (ref 0.35–5.50)

## 2023-03-31 MED ORDER — SILDENAFIL CITRATE 20 MG PO TABS: ORAL_TABLET | ORAL | 5 refills | Status: AC

## 2023-03-31 MED ORDER — TADALAFIL 20 MG PO TABS: 10.0000 mg | ORAL_TABLET | ORAL | 11 refills | Status: AC | PRN

## 2023-03-31 NOTE — Assessment & Plan Note (Addendum)
Chronic Follows with cardiology S/p cardioversion x 2, ablation Feels like he has been in sinus rhythm since then Continue eliquis 5 mg bid, metoprolol 12.5 mg twice daily No longer taking Multaq

## 2023-03-31 NOTE — Assessment & Plan Note (Signed)
Chronic Regular exercise and healthy diet encouraged Check lipid panel, cmp  Continue  crestor 5 mg daily

## 2023-03-31 NOTE — Telephone Encounter (Signed)
Pharmacy Patient Advocate Encounter   Received notification from CoverMyMeds that prior authorization for Sildenafil Citrate (PAH) 20MG  tablets is required/requested.   Insurance verification completed.   The patient is insured through [facility] MEDICARE PART D .   Per test claim: PA required and submitted KEY/EOC/Request #: [id number] CANCELLED due to med is being used for ED, not pulmonary hypertension, therefore it is not covered under Part D law.

## 2023-03-31 NOTE — Assessment & Plan Note (Signed)
Chronic Has been taking sildenafil 20 mg - 2-5 pills as needed Wonders about Cialis-trial of Cialis 10-20 mg as needed

## 2023-03-31 NOTE — Assessment & Plan Note (Signed)
Chronic Mild CAD Continue Crestor 5 mg daily Check lipid panel, cmp, tsh, cbc

## 2023-03-31 NOTE — Assessment & Plan Note (Signed)
Chronic Lab Results  Component Value Date   HGBA1C 5.8 10/03/2022   check A1c Discussed exercise, decrease sugars/carbs

## 2023-03-31 NOTE — Assessment & Plan Note (Signed)
Chronic Controlled, Stable Continue fluoxetine 10 mg daily

## 2023-04-22 ENCOUNTER — Other Ambulatory Visit: Payer: Self-pay | Admitting: Internal Medicine

## 2023-04-30 ENCOUNTER — Other Ambulatory Visit: Payer: Self-pay | Admitting: Internal Medicine

## 2023-05-01 NOTE — Progress Notes (Signed)
 Electrophysiology Office Note:   Date:  05/03/2023  ID:  Donald Wilkins, DOB 07/29/1950, MRN [id number]  Primary Cardiologist: Christell Constant, MD Electrophysiologist: Nobie Putnam, MD      History of Present Illness:   Donald Wilkins is a 73 y.o. male with h/o atrial flutter, coronary artery calcification, mild mitral regurgitation who is being seen today for follow up evaluation of atrial flutter. Patient had first diagnosis of atrial flutter in 2022.  He underwent successful cardioversion on 04/24/2020.  Patient contacted his cardiology office in September 2024 after his [name] mobile device was notifying him of atrial fibrillation.  During his office visit on 11/18/2022 he was found to be in atrial flutter.  He underwent successful cardioversion to normal sinus rhythm on 12/11/2022.  He underwent CTI ablation on 02/14/23.  Discussed the use of AI scribe software for clinical note transcription with the patient, who gave verbal consent to proceed.  History of Present Illness   The patient, with a history of atrial flutter, presents for a follow-up after a recent ablation. He reports feeling fine since the procedure and has been monitoring his heart rate using a Kardia device. He notes occasional bradycardia alerts, but no symptoms. He continues to take metoprolol. He has stopped dronedarone. The patient occasionally experiences dizziness when standing up quickly, otherwise doing well. No new or acute complaints today.     Review of systems complete and found to be negative unless listed in HPI.   EP Information / Studies Reviewed:    EKG is ordered today. Personal review as below. EKG Interpretation Date/Time:  Friday May 02 2023 14:05:46 EST Ventricular Rate:  44 PR Interval:  150 QRS Duration:  104 QT Interval:  440 QTC Calculation: 376 R Axis:   -11  Text Interpretation: Sinus bradycardia  Incomplete right bundle branch block  When compared with ECG of 14-Feb-2023 13:59,   Frequent PACs no longer present.  Vent. rate has decreased BY  62 BPM  Confirmed by Nobie Putnam [phone]) on 05/03/2023 12:29:25 PM   EKG 12/09/22:   Echo 03/27/2020: Normal LV size and function.  LVEF 60 to 65%. Normal RV size and function. Moderately dilated left atrium. Mild mitral regurgitation.  Mild to moderate TR.  Coronary calcium scoring 04/17/2022: Coronary calcium score of 56.7.  This was 37 percentile for age and sex matched control.   Risk Assessment/Calculations:    CHA2DS2-VASc Score = 2   This indicates a 2.2% annual risk of stroke. The patient's score is based upon: CHF History: 0 HTN History: 0 Diabetes History: 0 Stroke History: 0 Vascular Disease History: 1 Age Score: 1 Gender Score: 0             Physical Exam:   VS:  BP 118/64   Pulse 63  Ht 5\' 10"  (1.778 m)   Wt 199 lb (90.3 kg)   SpO2 98%   BMI 28.55 kg/m    Wt Readings from Last 3 Encounters:  05/02/23 194 lb (88 kg)  03/31/23 198 lb (89.8 kg)  02/14/23 196 lb (88.9 kg)     GEN: Well nourished, well developed in no acute distress NECK: No JVD; No carotid bruits CARDIAC: Normal rate, regular rhythm. RESPIRATORY:  Clear to auscultation without rales, wheezing or rhonchi  ABDOMEN: Soft, non-tender, non-distended EXTREMITIES:  No edema; No deformity   ASSESSMENT AND PLAN:   Donald Wilkins is a 73 y.o. male with h/o atrial flutter, coronary artery calcification, mild mitral regurgitation who is  being seen today for follow up evaluation of atrial flutter.  He is s/p CTI ablation on 02/14/23.  Patient's EKGs in the chart showed typical atrial flutter.  He does have a Kardia mobile device which she states has alerted him for probable atrial fibrillation in the past.  The strips are not available for review at this time.  His sinus rhythm EKGs have frequent PACs. His Kardia alerts could be false positives from sinus with PACs or AFL. However, he is at high risk for development of AF long term due  to frequent PACs and history of AFL.  # Typical atrial flutter, symptomatic: S/p ablation. -Stop dronedarone.  -Continue metoprolol.  -We will place ILR to monitor for AF. He also has resting bradycardia which we will monitor with his loop recorder. Explained risks, benefits, and alternatives to ILR implantation, including but not limited to bleeding, infection, damage to heart or lungs.  Pt verbalized understanding and agrees to proceed.  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 No known recurrence since ablation. Risks and benefits of anti-coagulation discussed and patient prefers to stop oral anti-coagulation. We will monitor with ILR and resume anti-caogulation if AF detected.  SURGEON:  Nobie Putnam, MD     PREPROCEDURE DIAGNOSIS:  Atrial flutter    POSTPROCEDURE DIAGNOSIS: Atrial flutter     PROCEDURES:   1. Implantable loop recorder implantation    INTRODUCTION:  Donald Wilkins presents with a history of atrial fibrillation The costs of loop recorder monitoring have been discussed with the patient.    DESCRIPTION OF PROCEDURE:  Informed written consent was obtained.   Time Out Completed with RN    The patient required no sedation for the procedure today.  Mapping over the patient's chest was performed to identify the area where electrograms were most prominent for ILR recording.  This area was found to be the left parasternal region over the 4th intercostal space. The patients left chest was therefore prepped and draped in the usual sterile fashion. The skin overlying the left parasternal region was infiltrated with lidocaine for local analgesia.  A 0.5-cm incision was made over the left parasternal region over the 3rd intercostal space.  A subcutaneous ILR pocket was fashioned using a combination of sharp and blunt dissection.  A [employer] LUX implantable loop recorder (serial # [id number]) was then placed into the pocket  R waves were  very prominent and measuring >0.14mV.  Steri- Strips and a sterile dressing were then applied.  There were no early apparent complications.     CONCLUSIONS:   1. Successful implantation of a implantable loop recorder for a history of atrial fibrillation.  2. No early apparent complications.   Follow up with Dr. Jimmey Ralph in 12 months.  Signed, Nobie Putnam, MD

## 2023-05-02 ENCOUNTER — Ambulatory Visit: Payer: Medicare HMO | Attending: Cardiology | Admitting: Cardiology

## 2023-05-02 ENCOUNTER — Encounter: Payer: Self-pay | Admitting: Cardiology

## 2023-05-02 VITALS — BP 112/72 | HR 44 | Ht 70.0 in | Wt 194.0 lb

## 2023-05-02 DIAGNOSIS — I483 Typical atrial flutter: Secondary | ICD-10-CM | POA: Diagnosis not present

## 2023-05-02 DIAGNOSIS — I480 Paroxysmal atrial fibrillation: Secondary | ICD-10-CM

## 2023-05-02 DIAGNOSIS — D6869 Other thrombophilia: Secondary | ICD-10-CM

## 2023-05-02 DIAGNOSIS — I2510 Atherosclerotic heart disease of native coronary artery without angina pectoris: Secondary | ICD-10-CM

## 2023-05-02 NOTE — Patient Instructions (Addendum)
 Medication Instructions:  Your physician has recommended you make the following change in your medication:  1) STOP taking Eliquis   Labwork: None ordered.  Testing/Procedures: None ordered.  Follow-Up:  Your physician wants you to follow-up in:  6 months with Dr. Elroy Channel will receive a reminder letter in the mail two months in advance. If you don't receive a letter, please call our office to schedule the follow-up appointment.    Implantable Loop Recorder Placement, Care After This sheet gives you information about how to care for yourself after your procedure. Your health care provider may also give you more specific instructions. If you have problems or questions, contact your health care provider. What can I expect after the procedure? After the procedure, it is common to have: Soreness or discomfort near the incision. Some swelling or bruising near the incision.  Follow these instructions at home: Incision care  Monitor your cardiac device site for redness, swelling, and drainage. Call the device clinic at [phone] if you experience these symptoms or fever/chills.  Keep the large square bandage on your site for 24 hours and then you may remove it yourself. Keep the steri-strips underneath in place.   You may shower after 72 hours / 3 days from your procedure with the steri-strips in place. They will usually fall off on their own, or may be removed after 10 days. Pat dry.   Avoid lotions, ointments, or perfumes over your incision until it is well-healed.  Please do not submerge in water until your site is completely healed.   Your device is MRI compatible.   Remote monitoring is used to monitor your cardiac device from home. This monitoring is scheduled every month by our office. It allows [place] to keep an eye on the function of your device to ensure it is working properly.  If your wound site starts to bleed apply pressure.      If you have any  questions/concerns please call the device clinic at [phone].  Activity  Return to your normal activities.  General instructions Follow instructions from your health care provider about how to manage your implantable loop recorder and transmit the information. Learn how to activate a recording if this is necessary for your type of device. You may go through a metal detection gate, and you may let someone hold a metal detector over your chest. Show your ID card if needed. Do not have an MRI unless you check with your health care provider first. Take over-the-counter and prescription medicines only as told by your health care provider. Keep all follow-up visits as told by your health care provider. This is important. Contact a health care provider if: You have redness, swelling, or pain around your incision. You have a fever. You have pain that is not relieved by your pain medicine. You have triggered your device because of fainting (syncope) or because of a heartbeat that feels like it is racing, slow, fluttering, or skipping (palpitations). Get help right away if you have: Chest pain. Difficulty breathing. Summary After the procedure, it is common to have soreness or discomfort near the incision. Change your dressing as told by your health care provider. Follow instructions from your health care provider about how to manage your implantable loop recorder and transmit the information. Keep all follow-up visits as told by your health care provider. This is important. This information is not intended to replace advice given to you by your health care provider. Make sure you  discuss any questions you have with your health care provider. Document Released: 01/30/2015 Document Revised: 04/05/2017 Document Reviewed: 04/05/2017 Elsevier Patient Education  2020 [employer].

## 2023-05-06 ENCOUNTER — Telehealth: Payer: Self-pay

## 2023-05-06 NOTE — Telephone Encounter (Signed)
 Alert received from [employer]:  Alert remote transmission:  1 AT event 3/1 @ 17:52, duration 4hrs , mean HR 128  Outreach made to Pt.    Left message requesting call back to assess for symptoms.

## 2023-05-07 NOTE — Telephone Encounter (Signed)
Attempted to contact patient. No answer, left message to call back

## 2023-05-08 NOTE — Telephone Encounter (Signed)
 Spoke with patient.    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   States he feels fine while playing, denies any s/s of SOB, dizziness, or chest discomfort. Also, of note, he has had a cold for a few days towards the end of the week and was taking Mucinex DM, but nothing with a decongestant.   He is taking all of his medications, no missed doses. Doing well.  He will call us if starts to notice any concerning symptoms.

## 2023-05-21 ENCOUNTER — Other Ambulatory Visit: Payer: Self-pay | Admitting: Nurse Practitioner

## 2023-05-21 DIAGNOSIS — I4892 Unspecified atrial flutter: Secondary | ICD-10-CM

## 2023-05-21 NOTE — Telephone Encounter (Signed)
 Prescription refill request for Eliquis received. Indication:aflutter Last office visit:2/25 Scr:0.92  1/25 Age: 73 Weight:88  kg  Prescription refilled

## 2023-05-30 ENCOUNTER — Ambulatory Visit: Payer: Medicare HMO

## 2023-05-30 VITALS — Ht 70.0 in | Wt 181.0 lb

## 2023-05-30 DIAGNOSIS — Z0000 Encounter for general adult medical examination without abnormal findings: Secondary | ICD-10-CM

## 2023-05-30 NOTE — Progress Notes (Signed)
 Subjective:   Donald Wilkins is a 73 y.o. who presents for a Medicare Wellness preventive visit.  Visit Complete: Virtual I connected with  Donald Wilkins on 05/30/23 by a video and audio enabled telemedicine application and verified that I am speaking with the correct person using two identifiers.  Patient Location: Home  Provider Location: Office/Clinic  I discussed the limitations of evaluation and management by telemedicine. The patient expressed understanding and agreed to proceed.  Vital Signs: Because this visit was a virtual/telehealth visit, some criteria may be missing or patient reported. Any vitals not documented were not able to be obtained and vitals that have been documented are patient reported.  Persons Participating in Visit: Patient.  AWV Questionnaire: No: Patient Medicare AWV questionnaire was not completed prior to this visit.  Cardiac Risk Factors include: advanced age (>20men, >72 women);dyslipidemia;male gender     Objective:    Today's Vitals   05/30/23 1001  Weight: 181 lb (82.1 kg)  Height: 5\' 10"  (1.778 m)   Body mass index is 25.97 kg/m.     05/30/2023    9:57 AM 02/14/2023    6:18 AM 12/11/2022   12:04 PM 02/07/2022    1:23 PM 04/24/2020   10:13 AM 09/16/2019   12:47 PM  Advanced Directives  Does Patient Have a Medical Advance Directive? Yes No No No No No  Type of Estate agent of [place];Living will       Copy of Healthcare Power of Attorney in Chart? No - copy requested       Would patient like information on creating a medical advance directive?    No - Patient declined No - Patient declined No - Patient declined    Current Medications (verified) Outpatient Encounter Medications as of 05/30/2023  Medication Sig   Ascorbic Acid (VITAMIN C) 1000 MG tablet Take 1,000 mg by mouth daily.   Cholecalciferol (VITAMIN D3) 50 MCG (2000 UT) capsule Take 2,000 Units by mouth daily.   Cyanocobalamin (VITAMIN B-12) 2500 MCG SUBL  Take 2,500 mcg by mouth daily.   ELIQUIS 5 MG TABS tablet TAKE 1 TABLET BY MOUTH TWICE A DAY   finasteride (PROSCAR) 5 MG tablet TAKE 1/2 TABLET BY MOUTH EVERY 2 DAYS   FLUoxetine (PROZAC) 10 MG capsule TAKE 1 CAPSULE BY MOUTH EVERY DAY   metoprolol tartrate (LOPRESSOR) 25 MG tablet TAKE 0.5 TABLETS BY MOUTH 2 TIMES DAILY.   Omega-3 Fatty Acids (SALMON OIL-1000 PO) Take 1,000 mg by mouth daily.   sildenafil (REVATIO) 20 MG tablet Take 2-5 pills as needed   tadalafil (CIALIS) 20 MG tablet Take 0.5-1 tablets (10-20 mg total) by mouth every other day as needed for erectile dysfunction.   Turmeric (QC TUMERIC COMPLEX PO) Take 1,000 mg by mouth daily.   zinc gluconate 50 MG tablet Take 50 mg by mouth daily.   rosuvastatin (CRESTOR) 5 MG tablet TAKE 1 TABLET (5 MG TOTAL) BY MOUTH DAILY. (Patient not taking: Reported on 05/30/2023)   No facility-administered encounter medications on file as of 05/30/2023.    Allergies (verified) Patient has no known allergies.   History: Past Medical History:  Diagnosis Date   A-fib (HCC)    Basal cell cancer    X 2;[facility]   Hyperlipidemia    Incomplete right bundle branch block    MVP (mitral valve prolapse)    on ECHO   Past Surgical History:  Procedure Laterality Date   A-FLUTTER ABLATION N/A 02/14/2023  Procedure: A-FLUTTER ABLATION;  Surgeon: Nobie Putnam, MD;  Location: [facility] INVASIVE CV LAB;  Service: Cardiovascular;  Laterality: N/A;   ANAL FISSURE REPAIR     BASAL CELL CARCINOMA EXCISION  2011 & 2012   Mohs L face; back   CARDIOVERSION N/A 04/24/2020   Procedure: CARDIOVERSION;  Surgeon: Christell Constant, MD;  Location: [facility] ENDOSCOPY;  Service: Cardiovascular;  Laterality: N/A;   CARDIOVERSION N/A 12/11/2022   Procedure: CARDIOVERSION;  Surgeon: Chilton Si, MD;  Location: [facility] INVASIVE CV LAB;  Service: Cardiovascular;  Laterality: N/A;   COLONOSCOPY     colonoscopy with polypectomy  03/05/2003   [facility], Dr Arlyce Dice; negative  2010   Family History  Problem Relation Age of Onset   Heart attack Mother 33   Diabetes Father    Lung cancer Father        smoker   Diabetes Sister    Hypertension Sister    Cancer Brother        malignant,brain stem   Heart attack Maternal Uncle        pre 47   Stomach cancer Neg Hx    Colon cancer Neg Hx    Esophageal cancer Neg Hx    Pancreatic cancer Neg Hx    Colon polyps Neg Hx    Rectal cancer Neg Hx    Social History   Socioeconomic History   Marital status: Married    Spouse name: Lanora Manis   Number of children: Not on file   Years of education: Not on file   Highest education level: Master's degree (e.g., MA, MS, MEng, MEd, MSW, MBA)  Occupational History   Not on file  Tobacco Use   Smoking status: Former    Current packs/day: 0.00    Types: Cigarettes    Quit date: 03/05/1971    Years since quitting: 52.2    Passive exposure: Past   Smokeless tobacco: Never   Tobacco comments:    smoked  1970-1973, up to 1/2 ppd  Vaping Use   Vaping status: Never Used  Substance and Sexual Activity   Alcohol use: Not Currently    Alcohol/week: 2.0 standard drinks of alcohol    Types: 1 Cans of beer, 1 Shots of liquor per week    Comment: socially; 2-3 / week   Drug use: No   Sexual activity: Not on file  Other Topics Concern   Not on file  Social History Narrative   Exercise: walks, chops wood   Social Drivers of Health   Financial Resource Strain: Low Risk  (05/30/2023)   Overall Financial Resource Strain (CARDIA)    Difficulty of Paying Living Expenses: Not hard at all  Food Insecurity: No Food Insecurity (05/30/2023)   Hunger Vital Sign    Worried About Running Out of Food in the Last Year: Never true    Ran Out of Food in the Last Year: Never true  Transportation Needs: No Transportation Needs (05/30/2023)   PRAPARE - Administrator, Civil Service (Medical): No    Lack of Transportation (Non-Medical): No  Physical Activity: Sufficiently  Active (05/30/2023)   Exercise Vital Sign    Days of Exercise per Week: 5 days    Minutes of Exercise per Session: 60 min  Stress: No Stress Concern Present (05/30/2023)   [employer] of Occupational Health - Occupational Stress Questionnaire    Feeling of Stress : Only a little  Social Connections: Moderately Integrated (05/30/2023)   Social Connection and Isolation Panel [  NHANES]    Frequency of Communication with Friends and Family: More than three times a week    Frequency of Social Gatherings with Friends and Family: More than three times a week    Attends Religious Services: Never    Database administrator or Organizations: Yes    Attends Engineer, structural: More than 4 times per year    Marital Status: Married    Tobacco Counseling Counseling given: No Tobacco comments: smoked  1970-1973, up to 1/2 ppd    Clinical Intake:  Pre-visit preparation completed: Yes  Pain : No/denies pain     BMI - recorded: 25.97 Nutritional Status: BMI 25 -29 Overweight Nutritional Risks: None Diabetes: No  Lab Results  Component Value Date   HGBA1C 5.8 03/31/2023   HGBA1C 5.8 10/03/2022   HGBA1C 5.9 03/29/2022     How often do you need to have someone help you when you read instructions, pamphlets, or other written materials from your doctor or pharmacy?: 1 - Never  Interpreter Needed?: No  Information entered by :: Hassell Halim, CMA   Activities of Daily Living     05/30/2023   10:03 AM 12/11/2022   12:04 PM  In your present state of health, do you have any difficulty performing the following activities:  Hearing? 0 0  Vision? 0 0  Difficulty concentrating or making decisions? 0 0  Walking or climbing stairs? 0   Dressing or bathing? 0   Doing errands, shopping? 0   Preparing Food and eating ? N   Using the Toilet? N   In the past six months, have you accidently leaked urine? N   Do you have problems with loss of bowel control? N   Managing your  Medications? N   Managing your Finances? N   Housekeeping or managing your Housekeeping? N     Patient Care Team: Pincus Sanes, MD as PCP - General (Internal Medicine) Christell Constant, MD as PCP - Cardiology (Cardiology) Nobie Putnam, MD as PCP - Electrophysiology (Cardiology) Jethro Bolus, MD as Consulting Physician (Ophthalmology) Bufford Buttner, MD as Referring Physician (Dermatology)  Indicate any recent Medical Services you may have received from other than [facility] providers in the past year (date may be approximate).     Assessment:   This is a routine wellness examination for Ociel.  Hearing/Vision screen Hearing Screening - Comments:: Denies hearing difficulties   Vision Screening - Comments:: Denies vision difficulties - had cataract surgery   Goals Addressed               This Visit's Progress     Patient Stated (pt-stated)        Patient stated he plans to continue exercising & lose weight (10-15lbs).       Depression Screen    05/30/2023   10:05 AM 03/31/2023    9:41 AM 05/08/2022    7:57 AM 03/29/2022    9:57 AM 02/07/2022    1:22 PM 03/27/2021    9:50 AM 09/16/2019   12:48 PM  PHQ 2/9 Scores  PHQ - 2 Score 0 0 0 0 0 0 0  PHQ- 9 Score 0 0 0 0  1     Fall Risk     05/30/2023   10:08 AM 03/31/2023    9:41 AM 05/08/2022    7:57 AM 03/29/2022    9:57 AM 02/07/2022    1:33 PM  Fall Risk   Falls in the past year? 0 0  0 0 0  Number falls in past yr: 0 0 0 0 0  Injury with Fall? 0 0 0 0 0  Risk for fall due to : No Fall Risks No Fall Risks No Fall Risks No Fall Risks No Fall Risks  Follow up Falls prevention discussed;Falls evaluation completed Falls evaluation completed Falls evaluation completed Falls evaluation completed Falls evaluation completed    MEDICARE RISK AT HOME:  Medicare Risk at Home Any stairs in or around the home?: No If so, are there any without handrails?: No Home free of loose throw rugs in walkways, pet beds, electrical  cords, etc?: Yes Adequate lighting in your home to reduce risk of falls?: Yes Life alert?: No Use of a cane, walker or w/c?: No Grab bars in the bathroom?: No Shower chair or bench in shower?: Yes Elevated toilet seat or a handicapped toilet?: No  TIMED UP AND GO:  Was the test performed?  No  Cognitive Function: 6CIT completed    02/07/2022    1:33 PM  MMSE - Mini Mental State Exam  Not completed: Refused        05/30/2023   10:09 AM  6CIT Screen  What Year? 0 points  What month? 0 points  What time? 0 points  Count back from 20 0 points  Months in reverse 0 points  Repeat phrase 0 points  Total Score 0 points    Immunizations Immunization History  Administered Date(s) Administered   Fluad Quad(high Dose 65+) 12/02/2018, 02/14/2022   Influenza, High Dose Seasonal PF 02/05/2018, 12/27/2019, 02/05/2021, 03/17/2023   Influenza,inj,Quad PF,6+ Mos 03/21/2015, 02/27/2016, 02/28/2017   PFIZER Comirnaty(Gray Top)Covid-19 Tri-Sucrose Vaccine 09/23/2020   PFIZER(Purple Top)SARS-COV-2 Vaccination 04/10/2019, 05/04/2019, 11/25/2019   Pfizer Covid-19 Vaccine Bivalent Booster 66yrs & up 02/05/2021   Pfizer(Comirnaty)Fall Seasonal Vaccine 12 years and older 03/17/2023   Pneumococcal Conjugate-13 03/18/2018   Pneumococcal Polysaccharide-23 07/01/2019   Td 05/28/2007   Tdap 12/02/2018   Zoster Recombinant(Shingrix) 07/12/2019, 09/10/2021   Zoster, Live 03/21/2015    Screening Tests Health Maintenance  Topic Date Due   COVID-19 Vaccine (7 - 2024-25 season) 09/14/2023   Medicare Annual Wellness (AWV)  05/29/2024   Colonoscopy  08/02/2024   DTaP/Tdap/Td (3 - Td or Tdap) 12/01/2028   Pneumonia Vaccine 14+ Years old  Completed   INFLUENZA VACCINE  Completed   Hepatitis C Screening  Completed   Zoster Vaccines- Shingrix  Completed   HPV VACCINES  Aged Out    Health Maintenance  There are no preventive care reminders to display for this patient. Health Maintenance Items  Addressed:05/30/2023   Additional Screening:  Vision Screening: Recommended annual ophthalmology exams for early detection of glaucoma and other disorders of the eye.  Dental Screening: Recommended annual dental exams for proper oral hygiene  Community Resource Referral / Chronic Care Management: CRR required this visit?  No   CCM required this visit?  No     Plan:     I have personally reviewed and noted the following in the patient's chart:   Medical and social history Use of alcohol, tobacco or illicit drugs  Current medications and supplements including opioid prescriptions. Patient is not currently taking opioid prescriptions. Functional ability and status Nutritional status Physical activity Advanced directives List of other physicians Hospitalizations, surgeries, and ER visits in previous 12 months Vitals Screenings to include cognitive, depression, and falls Referrals and appointments  In addition, I have reviewed and discussed with patient certain preventive protocols, quality metrics, and best practice  recommendations. A written personalized care plan for preventive services as well as general preventive health recommendations were provided to patient.     Darreld Mclean, CMA   05/30/2023   After Visit Summary: Due to this being a Video visit, the patient will review information via MyChart.  Notes: Please refer to Routing Comments.

## 2023-05-30 NOTE — Patient Instructions (Addendum)
 Donald Wilkins , Thank you for taking time to come for your Medicare Wellness Visit. I appreciate your ongoing commitment to your health goals. Please review the following plan we discussed and let me know if I can assist you in the future.   Referrals/Orders/Follow-Ups/Clinician Recommendations: Aim for 30 minutes of exercise or brisk walking, 6-8 glasses of water, and 5 servings of fruits and vegetables each day.   This is a list of the screening recommended for you and due dates:  Health Maintenance  Topic Date Due   COVID-19 Vaccine (7 - 2024-25 season) 09/14/2023   Medicare Annual Wellness Visit  05/29/2024   Colon Cancer Screening  08/02/2024   DTaP/Tdap/Td vaccine (3 - Td or Tdap) 12/01/2028   Pneumonia Vaccine  Completed   Flu Shot  Completed   Hepatitis C Screening  Completed   Zoster (Shingles) Vaccine  Completed   HPV Vaccine  Aged Out    Advanced directives: (Copy Requested) Please bring a copy of your health care power of attorney and living will to the office to be added to your chart at your convenience. You can mail to [facility] [address] [place], [place] [address] or email to [email or web address]  Next Medicare Annual Wellness Visit scheduled for next year: Yes

## 2023-06-02 ENCOUNTER — Ambulatory Visit (INDEPENDENT_AMBULATORY_CARE_PROVIDER_SITE_OTHER)

## 2023-06-02 DIAGNOSIS — I483 Typical atrial flutter: Secondary | ICD-10-CM | POA: Diagnosis not present

## 2023-06-02 LAB — CUP PACEART REMOTE DEVICE CHECK
Date Time Interrogation Session: 20250331010500
Implantable Pulse Generator Implant Date: 20250228
Pulse Gen Serial Number: 127374

## 2023-06-06 ENCOUNTER — Encounter

## 2023-06-12 ENCOUNTER — Other Ambulatory Visit: Payer: Self-pay | Admitting: Internal Medicine

## 2023-06-16 ENCOUNTER — Telehealth: Payer: Self-pay

## 2023-06-16 NOTE — Telephone Encounter (Addendum)
 Repeat alert remote transmission:  1 new AT event, 5hrs in duration, mean HR 127 Program AT off per protocol -  LM to assess symptoms due to 5 hours of AT (Avg HR 120-130s) event on Saturday around 5:32 pm.  Noted elevated HR's on histograms. Also, want to inform Dr. Daneil Dunker  that CV has turned off AT alert.

## 2023-06-17 NOTE — Telephone Encounter (Signed)
 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

## 2023-07-03 ENCOUNTER — Ambulatory Visit (INDEPENDENT_AMBULATORY_CARE_PROVIDER_SITE_OTHER)

## 2023-07-03 DIAGNOSIS — I4892 Unspecified atrial flutter: Secondary | ICD-10-CM | POA: Diagnosis not present

## 2023-07-04 LAB — CUP PACEART REMOTE DEVICE CHECK
Date Time Interrogation Session: 20250502010900
Implantable Pulse Generator Implant Date: 20250228
Pulse Gen Serial Number: 127374

## 2023-07-05 ENCOUNTER — Telehealth: Payer: Self-pay

## 2023-07-05 NOTE — Telephone Encounter (Signed)
 Placing EGM's in note for result note on 07/04/23. Appears CV Remote excluded EGM's for review. I have attached below.

## 2023-07-07 NOTE — Telephone Encounter (Signed)
 Outreach made to Pt.  Advised per Dr. Valeda Garter Eliquis  and follow up with Dr. Daneil Dunker.  Pt will restart Eliquis .  He states he has enough until his follow up.    Follow up appointment scheduled with Dr. Daneil Dunker.  Pt advised of new location for [facility].  All questions answered.

## 2023-07-17 NOTE — Progress Notes (Signed)
 Clarity Loop Recorder

## 2023-08-04 ENCOUNTER — Encounter

## 2023-08-06 NOTE — Progress Notes (Signed)
 " Electrophysiology Office Note:   Date:  08/08/2023  ID:  Donald Wilkins, DOB December 15, 1950, MRN [id number]  Primary Cardiologist: Stanly DELENA Leavens, MD Electrophysiologist: Fonda Kitty, MD      History of Present Illness:   Donald Wilkins is a 73 y.o. male with h/o atrial flutter s/p loop recorder, coronary artery calcification, mild mitral regurgitation who is being seen today for follow up.  Discussed the use of AI scribe software for clinical note transcription with the patient, who gave verbal consent to proceed.  History of Present Illness Donald Wilkins is a 73 year old male with atrial flutter who presents for evaluation of potential atrial fibrillation episodes.He underwent a flutter ablation previously and has been monitored for atrial fibrillation since with ILR. 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 No symptoms such as heart racing, skipping, or fluttering were reported.  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 Similarly, on April 5th, the episode started around 5 PM and lasted for about six hours, again aligning with his performance schedule.  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 He has been on a low-carb diet for the past three months, resulting in a weight loss of 22 pounds.  He is currently on a blood thinner, Eliquis , which he resumed after being notified of the potential atrial fibrillation episodes.  Review of systems complete and found to be negative unless listed in HPI.   EP Information / Studies Reviewed:    EKG is not ordered today. EKG from 05/02/23 reviewed which showed sinus bradycardia.       EKG  12/09/22:    Echo 03/27/2020: Normal LV size and function.  LVEF 60 to 65%. Normal RV size and function. Moderately dilated left atrium. Mild mitral regurgitation.  Mild to moderate TR.   Coronary calcium  scoring 04/17/2022: Coronary calcium  score of 56.7.  This was 37 percentile for age and sex matched control.  Risk Assessment/Calculations:    CHA2DS2-VASc Score = 2   This indicates a 2.2% annual risk of stroke. The patient's score is based upon: CHF History: 0 HTN History: 0 Diabetes History: 0 Stroke History: 0 Vascular Disease History: 1 Age Score: 1 Gender Score: 0             Physical Exam:   VS:  BP 122/64   Pulse 64   Ht 5' 10 (1.778 m)   Wt 178 lb 12.8 oz (81.1 kg)   SpO2 99%   BMI 25.66 kg/m    Wt Readings from Last 3 Encounters:  08/07/23 178 lb 12.8 oz (81.1 kg)  05/30/23 181 lb (82.1 kg)  05/02/23 194 lb (88 kg)     GEN: Well nourished, well developed in no acute distress NECK: No JVD CARDIAC: Normal rate, regular rhythm RESPIRATORY:  Clear to auscultation without rales, wheezing or rhonchi  ABDOMEN: Soft, non-distended EXTREMITIES:  No edema; No deformity   ASSESSMENT AND PLAN:   Donald Wilkins is a 73 y.o. male with h/o atrial flutter, coronary artery calcification, mild mitral regurgitation who is being seen today for follow up evaluation of atrial flutter.  He is s/p CTI ablation on 02/14/23.  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 I suspect this is more likely to be sinus tach with ectopy given the circumstances. We have not see AF alerts on his monitor outside of the time when he is playing the drums.     # Typical atrial flutter, symptomatic: S/p ablation. -Continue metoprolol  12.5mg  BID.  -Continue monitoring for AF with loop recorder.    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 No known recurrence since ablation - see above  regarding AF alerts. Risks and benefits of anti-coagulation discussed and patient prefers to stay off oral anti-coagulation if possible. We will continue to monitor with ILR and resume anti-caogulation if more convincing evidence of AF is detected.    Follow up with Dr. Kennyth in 6 months  Signed, Fonda Kennyth, MD  "

## 2023-08-07 ENCOUNTER — Ambulatory Visit: Attending: Cardiology | Admitting: Cardiology

## 2023-08-07 ENCOUNTER — Encounter: Payer: Self-pay | Admitting: Cardiology

## 2023-08-07 VITALS — BP 122/64 | HR 64 | Ht 70.0 in | Wt 178.8 lb

## 2023-08-07 DIAGNOSIS — D6869 Other thrombophilia: Secondary | ICD-10-CM | POA: Diagnosis not present

## 2023-08-07 DIAGNOSIS — I483 Typical atrial flutter: Secondary | ICD-10-CM | POA: Diagnosis not present

## 2023-08-07 NOTE — Patient Instructions (Addendum)

## 2023-08-10 ENCOUNTER — Other Ambulatory Visit: Payer: Self-pay | Admitting: Internal Medicine

## 2023-08-11 ENCOUNTER — Ambulatory Visit (INDEPENDENT_AMBULATORY_CARE_PROVIDER_SITE_OTHER)

## 2023-08-11 DIAGNOSIS — I4892 Unspecified atrial flutter: Secondary | ICD-10-CM | POA: Diagnosis not present

## 2023-08-11 LAB — CUP PACEART REMOTE DEVICE CHECK
Date Time Interrogation Session: 20250609005800
Implantable Pulse Generator Implant Date: 20250228
Pulse Gen Serial Number: 127374

## 2023-08-14 NOTE — Progress Notes (Signed)
 [facility] Summary Report / Loop Recorder

## 2023-08-14 NOTE — Addendum Note (Signed)
 Addended by: Lott Rouleau A on: 08/14/2023 10:41 AM   Modules accepted: Orders

## 2023-08-17 ENCOUNTER — Ambulatory Visit: Payer: Self-pay | Admitting: Cardiology

## 2023-09-04 ENCOUNTER — Encounter

## 2023-09-11 ENCOUNTER — Ambulatory Visit

## 2023-09-11 DIAGNOSIS — I4892 Unspecified atrial flutter: Secondary | ICD-10-CM | POA: Diagnosis not present

## 2023-09-11 LAB — CUP PACEART REMOTE DEVICE CHECK
Date Time Interrogation Session: 20250710100200
Implantable Pulse Generator Implant Date: 20250228
Pulse Gen Serial Number: 127374

## 2023-09-21 ENCOUNTER — Ambulatory Visit: Payer: Self-pay | Admitting: Cardiology

## 2023-09-24 NOTE — Progress Notes (Signed)
 Bsx Loop Recorder

## 2023-09-24 NOTE — Progress Notes (Unsigned)
    Subjective:    Patient ID: Donald Wilkins, male    DOB: February 26, 1951, 74 y.o.   MRN: [id number]      HPI Donald Wilkins is here for No chief complaint on file.   Abdominal hernia-     Medications and allergies reviewed with patient and updated if appropriate.  Current Outpatient Medications on File Prior to Visit  Medication Sig Dispense Refill   Ascorbic Acid (VITAMIN C) 1000 MG tablet Take 1,000 mg by mouth daily.     Cholecalciferol (VITAMIN D3) 50 MCG (2000 UT) capsule Take 2,000 Units by mouth daily.     Cyanocobalamin (VITAMIN B-12) 2500 MCG SUBL Take 2,500 mcg by mouth daily.     ELIQUIS  5 MG TABS tablet TAKE 1 TABLET BY MOUTH TWICE A DAY 60 tablet 5   finasteride  (PROSCAR ) 5 MG tablet TAKE 1/2 TABLET BY MOUTH EVERY 2 DAYS 23 tablet 15   FLUoxetine  (PROZAC ) 10 MG capsule TAKE 1 CAPSULE BY MOUTH EVERY DAY 90 capsule 2   metoprolol  tartrate (LOPRESSOR ) 25 MG tablet TAKE 1/2 TABLET TWICE A DAY BY MOUTH 90 tablet 1   Omega-3 Fatty Acids (SALMON OIL-1000 PO) Take 1,000 mg by mouth daily.     sildenafil  (REVATIO ) 20 MG tablet Take 2-5 pills as needed 40 tablet 5   tadalafil  (CIALIS ) 20 MG tablet Take 0.5-1 tablets (10-20 mg total) by mouth every other day as needed for erectile dysfunction. 8 tablet 11   Turmeric (QC TUMERIC COMPLEX PO) Take 1,000 mg by mouth daily.     zinc gluconate 50 MG tablet Take 50 mg by mouth daily.     No current facility-administered medications on file prior to visit.    Review of Systems     Objective:  There were no vitals filed for this visit. BP Readings from Last 3 Encounters:  08/07/23 122/64  05/02/23 112/72  03/31/23 120/74   Wt Readings from Last 3 Encounters:  08/07/23 178 lb 12.8 oz (81.1 kg)  05/30/23 181 lb (82.1 kg)  05/02/23 194 lb (88 kg)   There is no height or weight on file to calculate BMI.    Physical Exam         Assessment & Plan:    See Problem List for Assessment and Plan of chronic medical problems.

## 2023-09-25 ENCOUNTER — Ambulatory Visit: Payer: Self-pay | Admitting: Internal Medicine

## 2023-09-25 ENCOUNTER — Ambulatory Visit: Admitting: Internal Medicine

## 2023-09-25 VITALS — BP 116/70 | HR 89 | Temp 98.0°F | Ht 70.0 in | Wt 175.0 lb

## 2023-09-25 DIAGNOSIS — M25571 Pain in right ankle and joints of right foot: Secondary | ICD-10-CM

## 2023-09-25 DIAGNOSIS — R7303 Prediabetes: Secondary | ICD-10-CM | POA: Diagnosis not present

## 2023-09-25 DIAGNOSIS — E782 Mixed hyperlipidemia: Secondary | ICD-10-CM

## 2023-09-25 DIAGNOSIS — I4892 Unspecified atrial flutter: Secondary | ICD-10-CM

## 2023-09-25 DIAGNOSIS — K4090 Unilateral inguinal hernia, without obstruction or gangrene, not specified as recurrent: Secondary | ICD-10-CM | POA: Diagnosis not present

## 2023-09-25 LAB — LIPID PANEL
Cholesterol: 206 mg/dL — ABNORMAL HIGH (ref 0–200)
HDL: 60.2 mg/dL (ref >39.00)
LDL Cholesterol: 132 mg/dL — ABNORMAL HIGH (ref 0–99)
NonHDL: 145.71
Total CHOL/HDL Ratio: 3
Triglycerides: 68 mg/dL (ref 0.0–149.0)
VLDL: 13.6 mg/dL (ref 0.0–40.0)

## 2023-09-25 LAB — COMPREHENSIVE METABOLIC PANEL WITH GFR
ALT: 18 U/L (ref 0–53)
AST: 23 U/L (ref 0–37)
Albumin: 4.3 g/dL (ref 3.5–5.2)
Alkaline Phosphatase: 50 U/L (ref 39–117)
BUN: 19 mg/dL (ref 6–23)
CO2: 29 meq/L (ref 19–32)
Calcium: 9.3 mg/dL (ref 8.4–10.5)
Chloride: 103 meq/L (ref 96–112)
Creatinine, Ser: 0.91 mg/dL (ref 0.40–1.50)
GFR: 84.85 mL/min (ref >60.00)
Glucose, Bld: 96 mg/dL (ref 70–99)
Potassium: 4.1 meq/L (ref 3.5–5.1)
Sodium: 138 meq/L (ref 135–145)
Total Bilirubin: 0.8 mg/dL (ref 0.2–1.2)
Total Protein: 6.9 g/dL (ref 6.0–8.3)

## 2023-09-25 LAB — HEMOGLOBIN A1C: Hgb A1c MFr Bld: 5.8 % (ref 4.6–6.5)

## 2023-09-25 NOTE — Assessment & Plan Note (Signed)
 New Not sure what is causing his pain Discussed avoiding tight shoes which he will do If no improvement would recommend seeing podiatry

## 2023-09-25 NOTE — Patient Instructions (Addendum)
     Have blood work done today    A referral was ordered for general surgery and someone will call you to schedule an appointment.

## 2023-09-25 NOTE — Assessment & Plan Note (Addendum)
 Chronic Lab Results  Component Value Date   HGBA1C 5.8 03/31/2023   check A1c Has lost weight-20 pounds-compliant with low sugar/carb diet Continue exercise

## 2023-09-25 NOTE — Assessment & Plan Note (Addendum)
 Chronic Stopped crestor  several months because of cramping-resolved after stopping medication Has gone on a keto diet and lost 20 pounds Continue exercise and healthy diet encouraged Check lipid panel, cmp Would like to avoid additional medication

## 2023-09-25 NOTE — Assessment & Plan Note (Signed)
 Chronic Follows with cardiology S/p cardioversion x 2, ablation Eliquis  was discontinued-on aspirin  81 mg daily Continue metoprolol  12.5 mg twice daily

## 2023-09-25 NOTE — Assessment & Plan Note (Signed)
 [facility] noticed this in March It has gotten larger since March Intermittently tender with certain activities Referral ordered for general surgery

## 2023-10-06 ENCOUNTER — Encounter

## 2023-10-09 DIAGNOSIS — K4090 Unilateral inguinal hernia, without obstruction or gangrene, not specified as recurrent: Secondary | ICD-10-CM | POA: Diagnosis not present

## 2023-10-10 ENCOUNTER — Telehealth: Payer: Self-pay

## 2023-10-10 NOTE — Telephone Encounter (Signed)
   Pre-operative Risk Assessment    Patient Name: Donald Wilkins  DOB: [birth year] MRN: [id number]   Date of last office visit: 08/07/23 FONDA KITTY, MD Date of next office visit: NONE   Request for Surgical Clearance    Procedure:  HERNIA SURGERY  Date of Surgery:  Clearance TBD                                Surgeon:  LYNDA COBBLE, MD Surgeon's Group or Practice Name:  [facility] Phone number:  [phone] Fax number:  [phone]  ATTN: ROSALINE SPRANG, CMA   Type of Clearance Requested:   - Medical  - Pharmacy:  Hold Aspirin      Type of Anesthesia:  General    Additional requests/questions:    Signed, Lucie DELENA Ku   10/10/2023, 4:51 PM

## 2023-10-13 ENCOUNTER — Ambulatory Visit

## 2023-10-13 DIAGNOSIS — I4892 Unspecified atrial flutter: Secondary | ICD-10-CM | POA: Diagnosis not present

## 2023-10-14 ENCOUNTER — Telehealth (HOSPITAL_BASED_OUTPATIENT_CLINIC_OR_DEPARTMENT_OTHER): Payer: Self-pay

## 2023-10-14 LAB — CUP PACEART REMOTE DEVICE CHECK
Date Time Interrogation Session: 20250811090600
Implantable Pulse Generator Implant Date: 20250228
Pulse Gen Serial Number: 127374

## 2023-10-14 NOTE — Telephone Encounter (Signed)
 Preop tele appt now scheduled, med rec and consent done.

## 2023-10-14 NOTE — Telephone Encounter (Signed)
  Patient Consent for Virtual Visit        Donald Wilkins has provided verbal consent on 10/14/2023 for a virtual visit (video or telephone).   CONSENT FOR VIRTUAL VISIT FOR:  Donald Wilkins  By participating in this virtual visit I agree to the following:  I hereby voluntarily request, consent and authorize [facility] and its employed or contracted physicians, physician assistants, nurse practitioners or other licensed health care professionals (the Practitioner), to provide me with telemedicine health care services (the "Services) as deemed necessary by the treating Practitioner. I acknowledge and consent to receive the Services by the Practitioner via telemedicine. I understand that the telemedicine visit will involve communicating with the Practitioner through live audiovisual communication technology and the disclosure of certain medical information by electronic transmission. I acknowledge that I have been given the opportunity to request an in-person assessment or other available alternative prior to the telemedicine visit and am voluntarily participating in the telemedicine visit.  I understand that I have the right to withhold or withdraw my consent to the use of telemedicine in the course of my care at any time, without affecting my right to future care or treatment, and that the Practitioner or I may terminate the telemedicine visit at any time. I understand that I have the right to inspect all information obtained and/or recorded in the course of the telemedicine visit and may receive copies of available information for a reasonable fee.  I understand that some of the potential risks of receiving the Services via telemedicine include:  Delay or interruption in medical evaluation due to technological equipment failure or disruption; Information transmitted may not be sufficient (e.g. poor resolution of images) to allow for appropriate medical decision making by the Practitioner;  and/or  In rare instances, security protocols could fail, causing a breach of personal health information.  Furthermore, I acknowledge that it is my responsibility to provide information about my medical history, conditions and care that is complete and accurate to the best of my ability. I acknowledge that Practitioner's advice, recommendations, and/or decision may be based on factors not within their control, such as incomplete or inaccurate data provided by me or distortions of diagnostic images or specimens that may result from electronic transmissions. I understand that the practice of medicine is not an exact science and that Practitioner makes no warranties or guarantees regarding treatment outcomes. I acknowledge that a copy of this consent can be made available to me via my patient portal [facility] MyChart), or I can request a printed copy by calling the office of [facility].    I understand that my insurance will be billed for this visit.   I have read or had this consent read to me. I understand the contents of this consent, which adequately explains the benefits and risks of the Services being provided via telemedicine.  I have been provided ample opportunity to ask questions regarding this consent and the Services and have had my questions answered to my satisfaction. I give my informed consent for the services to be provided through the use of telemedicine in my medical care

## 2023-10-14 NOTE — Telephone Encounter (Signed)
   Name: Donald Wilkins  DOB: 10-Jan-1951  MRN: [id number]  Primary Cardiologist: Stanly DELENA Leavens, MD  Preoperative team, please contact this patient and set up a phone call appointment for further preoperative risk assessment. Please obtain consent and complete medication review. Thank you for your help.  I confirm that guidance regarding antiplatelet and oral anticoagulation therapy has been completed and, if necessary, noted below.  Regarding ASA therapy, we recommend continuation of ASA throughout the perioperative period.  However, if the surgeon feels that cessation of ASA is required in the perioperative period, it may be stopped 5-7 days prior to surgery with a plan to resume it as soon as felt to be feasible from a surgical standpoint in the post-operative period.   I also confirmed the patient resides in the state of [place] . As per [facility] Medical Board telemedicine laws, the patient must reside in the state in which the provider is licensed.  Jaysie Benthall D Tajay Muzzy, NP 10/14/2023, 1:03 PM  [facility]

## 2023-10-15 ENCOUNTER — Ambulatory Visit: Payer: Self-pay | Admitting: Cardiology

## 2023-10-22 ENCOUNTER — Ambulatory Visit: Attending: Cardiology | Admitting: Emergency Medicine

## 2023-10-22 DIAGNOSIS — Z01810 Encounter for preprocedural cardiovascular examination: Secondary | ICD-10-CM | POA: Diagnosis not present

## 2023-10-22 NOTE — Progress Notes (Signed)
 Virtual Visit via Telephone Note   Because of Donald Wilkins co-morbid illnesses, he is at least at moderate risk for complications without adequate follow up.  This format is felt to be most appropriate for this patient at this time.  Due to technical limitations with video connection (technology), today's appointment will be conducted as an audio only telehealth visit, and Donald Wilkins verbally agreed to proceed in this manner.   All issues noted in this document were discussed and addressed.  No physical exam could be performed with this format.  Evaluation Performed:  Preoperative cardiovascular risk assessment _____________   Date:  10/22/2023   Patient ID:  Donald Wilkins, DOB 1950/12/31, MRN [id number] Patient Location:  Home Provider location:   Office  Primary Care Provider:  Geofm Glade PARAS, MD Primary Cardiologist:  Stanly DELENA Leavens, MD  Chief Complaint / Patient Profile   73 y.o. y/o male with a h/o atrial flutter, coronary artery calcification, mild mitral valve regurgitation who is pending hernia surgery on date TBD with [facility] and presents today for telephonic preoperative cardiovascular risk assessment.  History of Present Illness    Donald Wilkins is a 73 y.o. male who presents via audio/video conferencing for a telehealth visit today.  Pt was last seen in cardiology clinic on 08/07/2023 by Dr. Kennyth.  At that time Donald Wilkins was doing well.  The patient is now pending procedure as outlined above. Since his last visit, he denies chest pain, shortness of breath, lower extremity edema, fatigue, palpitations, melena, hematuria, hemoptysis, diaphoresis, weakness, presyncope, syncope, orthopnea, and PND.  Today patient is doing well overall.  He is without any acute cardiovascular concerns or complaints at this time.  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  Overall he is able to complete greater than 4 METS.  Past Medical History    Past Medical History:  Diagnosis Date   A-fib (HCC)    Basal cell cancer    X 2;[facility]   Hyperlipidemia    Incomplete right bundle branch block    MVP (mitral valve prolapse)    on ECHO   Past Surgical History:  Procedure Laterality Date   A-FLUTTER ABLATION N/A 02/14/2023   Procedure: A-FLUTTER ABLATION;  Surgeon: Kennyth Chew, MD;  Location: [facility] INVASIVE CV LAB;  Service: Cardiovascular;  Laterality: N/A;   ANAL FISSURE REPAIR     BASAL CELL CARCINOMA EXCISION  2011 & 2012   Mohs L face; back   CARDIOVERSION N/A 04/24/2020   Procedure: CARDIOVERSION;  Surgeon: Leavens Stanly DELENA, MD;  Location: [facility] ENDOSCOPY;  Service: Cardiovascular;  Laterality: N/A;   CARDIOVERSION N/A 12/11/2022   Procedure: CARDIOVERSION;  Surgeon: Raford Riggs, MD;  Location: [facility] INVASIVE CV LAB;  Service: Cardiovascular;  Laterality: N/A;   COLONOSCOPY     colonoscopy with polypectomy  03/05/2003   [facility], Dr Debrah; negative 2010    Allergies  No Known Allergies  Home Medications    Prior to Admission medications   Medication Sig Start Date End Date Taking? Authorizing Provider  Ascorbic Acid (VITAMIN C) 1000 MG tablet Take 1,000 mg by mouth daily.    [provider]  aspirin  EC 81 MG tablet Take 81 mg by mouth daily. Swallow whole.    [provider]  Cholecalciferol (VITAMIN D3) 50 MCG (  2000 UT) capsule Take 2,000 Units by mouth daily.    [provider]  Cyanocobalamin (VITAMIN B-12) 2500 MCG SUBL Take 2,500 mcg by mouth daily.    [provider]  finasteride  (PROSCAR ) 5 MG tablet TAKE 1/2 TABLET BY MOUTH EVERY 2 DAYS 04/22/23   Geofm Glade PARAS, MD  FLUoxetine  (PROZAC ) 10 MG capsule TAKE 1 CAPSULE BY MOUTH EVERY DAY 02/03/23   Geofm Glade PARAS, MD  metoprolol  tartrate (LOPRESSOR ) 25 MG tablet TAKE 1/2 TABLET TWICE A  DAY BY MOUTH 08/11/23   Burns, Glade PARAS, MD  Omega-3 Fatty Acids (SALMON OIL-1000 PO) Take 1,000 mg by mouth daily.    [provider]  sildenafil  (REVATIO ) 20 MG tablet Take 2-5 pills as needed 03/31/23   Geofm Glade PARAS, MD  tadalafil  (CIALIS ) 20 MG tablet Take 0.5-1 tablets (10-20 mg total) by mouth every other day as needed for erectile dysfunction. 03/31/23   Geofm Glade PARAS, MD  Turmeric (QC TUMERIC COMPLEX PO) Take 1,000 mg by mouth daily.    [provider]  zinc gluconate 50 MG tablet Take 50 mg by mouth daily.    [provider]    Physical Exam    Vital Signs:  Donald Wilkins does not have vital signs available for review today.  Given telephonic nature of communication, physical exam is limited. AAOx3. NAD. Normal affect.  Speech and respirations are unlabored.  Accessory Clinical Findings    None  Assessment & Plan    1.  Preoperative Cardiovascular Risk Assessment: According to the Revised Cardiac Risk Index (RCRI), his Perioperative Risk of Major Cardiac Event is (%): 0.4. His Functional Capacity in METs is: 6.61 according to the Duke Activity Status Index (DASI). Therefore, based on ACC/AHA guidelines, patient would be at acceptable risk for the planned procedure without further cardiovascular testing.  The patient was advised that if he develops new symptoms prior to surgery to contact our office to arrange for a follow-up visit, and he verbalized understanding.  Regarding ASA therapy, we recommend continuation of ASA throughout the perioperative period. However, if the surgeon feels that cessation of ASA is required in the perioperative period, it may be stopped 5-7 days prior to surgery with a plan to resume it as soon as felt to be feasible from a surgical standpoint in the post-operative period.   A copy of this note will be routed to requesting surgeon.  Time:   Today, I have spent 8 minutes with the patient with telehealth technology discussing  medical history, symptoms, and management plan.     Lum LITTIE Louis, NP  10/22/2023, 10:10 AM

## 2023-11-06 ENCOUNTER — Encounter

## 2023-11-13 ENCOUNTER — Ambulatory Visit (INDEPENDENT_AMBULATORY_CARE_PROVIDER_SITE_OTHER)

## 2023-11-13 DIAGNOSIS — I4892 Unspecified atrial flutter: Secondary | ICD-10-CM | POA: Diagnosis not present

## 2023-11-13 LAB — CUP PACEART REMOTE DEVICE CHECK
Date Time Interrogation Session: 20250911100900
Implantable Pulse Generator Implant Date: 20250228
Pulse Gen Serial Number: 127374

## 2023-11-20 NOTE — Progress Notes (Signed)
 Remote Loop Recorder Transmission

## 2023-11-28 NOTE — Progress Notes (Signed)
 Remote Loop Recorder Transmission

## 2023-12-08 ENCOUNTER — Encounter

## 2023-12-11 NOTE — Progress Notes (Signed)
 Remote Loop Recorder Transmission

## 2023-12-15 ENCOUNTER — Ambulatory Visit

## 2023-12-15 DIAGNOSIS — I4892 Unspecified atrial flutter: Secondary | ICD-10-CM

## 2023-12-15 LAB — CUP PACEART REMOTE DEVICE CHECK
Date Time Interrogation Session: 20251013081300
Implantable Pulse Generator Implant Date: 20250228
Pulse Gen Serial Number: 127374

## 2023-12-17 NOTE — Progress Notes (Signed)
 Remote Loop Recorder Transmission

## 2023-12-27 ENCOUNTER — Ambulatory Visit: Payer: Self-pay | Admitting: Cardiology

## 2024-01-05 DIAGNOSIS — K4090 Unilateral inguinal hernia, without obstruction or gangrene, not specified as recurrent: Secondary | ICD-10-CM | POA: Diagnosis not present

## 2024-01-08 ENCOUNTER — Encounter

## 2024-01-14 ENCOUNTER — Other Ambulatory Visit: Payer: Self-pay | Admitting: Internal Medicine

## 2024-01-15 ENCOUNTER — Ambulatory Visit (INDEPENDENT_AMBULATORY_CARE_PROVIDER_SITE_OTHER)

## 2024-01-15 DIAGNOSIS — I4892 Unspecified atrial flutter: Secondary | ICD-10-CM | POA: Diagnosis not present

## 2024-01-16 ENCOUNTER — Ambulatory Visit: Payer: Self-pay | Admitting: Cardiology

## 2024-01-16 LAB — CUP PACEART REMOTE DEVICE CHECK
Date Time Interrogation Session: 20251113101600
Implantable Pulse Generator Implant Date: 20250228
Pulse Gen Serial Number: 127374

## 2024-01-17 ENCOUNTER — Other Ambulatory Visit: Payer: Self-pay | Admitting: Internal Medicine

## 2024-01-19 NOTE — Telephone Encounter (Signed)
 THERE IS NO ACTIVE OAC IN PT MED LIST

## 2024-01-19 NOTE — Progress Notes (Signed)
 Writer reached out to [employer], [employer] Rep. to advise how to obtain EGM's. Joey stated he will follow up when he has more information/available.

## 2024-01-20 NOTE — Progress Notes (Signed)
 Remote Loop Recorder Transmission

## 2024-01-20 NOTE — Telephone Encounter (Signed)
 Reviewed with Dr. Kennyth.  Advised that Afib episodes had been reprogrammed to 30 minutes d/t frequent ectopy.  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  Per Dr. Kennyth, because Pt has been taken off his OAC we need to ensure we are monitoring for any true atrial fibrillation.    Per review of all events available in [facility]-all recordings are on Friday or Saturday evenings.    Dr. Kennyth advised.  Will continue to monitor.

## 2024-02-02 DIAGNOSIS — D1801 Hemangioma of skin and subcutaneous tissue: Secondary | ICD-10-CM | POA: Diagnosis not present

## 2024-02-02 DIAGNOSIS — L820 Inflamed seborrheic keratosis: Secondary | ICD-10-CM | POA: Diagnosis not present

## 2024-02-02 DIAGNOSIS — L814 Other melanin hyperpigmentation: Secondary | ICD-10-CM | POA: Diagnosis not present

## 2024-02-02 DIAGNOSIS — Z85828 Personal history of other malignant neoplasm of skin: Secondary | ICD-10-CM | POA: Diagnosis not present

## 2024-02-02 DIAGNOSIS — D2262 Melanocytic nevi of left upper limb, including shoulder: Secondary | ICD-10-CM | POA: Diagnosis not present

## 2024-02-02 DIAGNOSIS — L570 Actinic keratosis: Secondary | ICD-10-CM | POA: Diagnosis not present

## 2024-02-02 DIAGNOSIS — L821 Other seborrheic keratosis: Secondary | ICD-10-CM | POA: Diagnosis not present

## 2024-02-15 ENCOUNTER — Ambulatory Visit

## 2024-02-15 DIAGNOSIS — I4892 Unspecified atrial flutter: Secondary | ICD-10-CM | POA: Diagnosis not present

## 2024-02-17 LAB — CUP PACEART REMOTE DEVICE CHECK
Date Time Interrogation Session: 20251214002000
Implantable Pulse Generator Implant Date: 20250228
Pulse Gen Serial Number: 127374

## 2024-02-19 ENCOUNTER — Ambulatory Visit: Payer: Self-pay | Admitting: Cardiology

## 2024-02-20 NOTE — Progress Notes (Signed)
 Remote Loop Recorder Transmission

## 2024-03-17 ENCOUNTER — Ambulatory Visit

## 2024-03-17 DIAGNOSIS — I4892 Unspecified atrial flutter: Secondary | ICD-10-CM

## 2024-03-17 LAB — CUP PACEART REMOTE DEVICE CHECK
Date Time Interrogation Session: 20260114002100
Implantable Pulse Generator Implant Date: 20250228
Pulse Gen Serial Number: 127374

## 2024-03-21 ENCOUNTER — Ambulatory Visit: Payer: Self-pay | Admitting: Cardiology

## 2024-03-24 NOTE — Progress Notes (Signed)
 Remote Loop Recorder Transmission

## 2024-04-17 ENCOUNTER — Encounter

## 2024-05-18 ENCOUNTER — Encounter

## 2024-06-08 ENCOUNTER — Ambulatory Visit

## 2024-06-08 ENCOUNTER — Encounter: Admitting: Internal Medicine

## 2024-06-18 ENCOUNTER — Encounter
